# Patient Record
Sex: Female | Born: 1995 | Race: Black or African American | Hispanic: No | Marital: Single
Health system: Northeastern US, Academic
[De-identification: ages and names within clinical notes are randomized; demographics above are authoritative.]

## PROBLEM LIST (undated history)

## (undated) DIAGNOSIS — C801 Malignant (primary) neoplasm, unspecified: Secondary | ICD-10-CM

## (undated) DIAGNOSIS — G4730 Sleep apnea, unspecified: Secondary | ICD-10-CM

## (undated) DIAGNOSIS — D649 Anemia, unspecified: Secondary | ICD-10-CM

## (undated) DIAGNOSIS — R7303 Prediabetes: Secondary | ICD-10-CM

## (undated) DIAGNOSIS — S83519A Sprain of anterior cruciate ligament of unspecified knee, initial encounter: Secondary | ICD-10-CM

## (undated) HISTORY — PX: WISDOM TOOTH EXTRACTION: SHX21

## (undated) HISTORY — PX: TUMOR REMOVAL: SHX12

---

## 2017-01-04 ENCOUNTER — Ambulatory Visit (HOSPITAL_COMMUNITY)
Admission: EM | Admit: 2017-01-04 | Discharge: 2017-01-04 | Disposition: A | Discharge status: Home / Self Care | Payer: Managed Care, Other (non HMO) | Attending: Family Medicine | Admitting: Family Medicine

## 2017-01-04 ENCOUNTER — Encounter (HOSPITAL_COMMUNITY): Payer: Self-pay | Admitting: *Deleted

## 2017-01-04 DIAGNOSIS — S86911A Strain of unspecified muscle(s) and tendon(s) at lower leg level, right leg, initial encounter: Secondary | ICD-10-CM

## 2017-01-04 MED ORDER — MELOXICAM 7.5 MG PO TABS: 7.5000 mg | ORAL_TABLET | Freq: Two times a day (BID) | ORAL | 1 refills | Status: DC

## 2017-01-04 NOTE — ED Provider Notes (Signed)
[place]    CSN: [id number] Arrival date & time: 01/04/17  1028     History   Chief Complaint Chief Complaint  Patient presents with  . Knee Pain    HPI Elaine Carr is a 21 y.o. female.   The history is provided by the patient.  Knee Pain  Location:  Knee Time since incident:  1 month Injury: yes   Mechanism of injury: fall   Fall:    Fall occurred:  Walking   Entrapped after fall: no   Knee location:  R knee Pain details:    Severity:  Moderate   Progression:  Unchanged Chronicity:  New (pt states seen in ER with neg x-rays and given cane but problems continue, no ortho follow-up.) Dislocation: no   Prior injury to area:  No Associated symptoms: decreased ROM, stiffness and swelling     History reviewed. No pertinent past medical history.  There are no active problems to display for this patient.   Past Surgical History:  Procedure Laterality Date  . TUMOR REMOVAL     stomach    OB History    No data available       Home Medications    Prior to Admission medications   Medication Sig Start Date End Date Taking? Authorizing Provider  calcium-vitamin D (OSCAL WITH D) 250-125 MG-UNIT tablet Take 1 tablet by mouth daily.   Yes Historical Provider, MD  metFORMIN (GLUCOPHAGE) 1000 MG tablet Take 1,000 mg by mouth 2 (two) times daily with a meal.   Yes Historical Provider, MD  meloxicam (MOBIC) 7.5 MG tablet Take 1 tablet (7.5 mg total) by mouth 2 (two) times daily after a meal. 01/04/17   Billy Fischer, MD    Family History History reviewed. No pertinent family history.  Social History Social History  Substance Use Topics  . Smoking status: Never Smoker  . Smokeless tobacco: Never Used  . Alcohol use No     Allergies   Patient has no known allergies.   Review of Systems Review of Systems  Musculoskeletal: Positive for stiffness.     Physical Exam Triage Vital Signs ED Triage Vitals  Enc Vitals Group     BP --    Pulse Rate 01/04/17 1118 108     Resp 01/04/17 1118 19     Temp 01/04/17 1118 98 F (36.7 C)     Temp Source 01/04/17 1118 Oral     SpO2 01/04/17 1118 100 %     Weight --      Height --      Head Circumference --      Peak Flow --      Pain Score 01/04/17 1121 8     Pain Loc --      Pain Edu? --      Excl. in [place]? --    No data found.   Updated Vital Signs Pulse 108   Temp 98 F (36.7 C) (Oral)   Resp 19   SpO2 100%   Visual Acuity Right Eye Distance:   Left Eye Distance:   Bilateral Distance:    Right Eye Near:   Left Eye Near:    Bilateral Near:     Physical Exam  Constitutional: She is oriented to person, place, and time. She appears well-developed and well-nourished.  Musculoskeletal: She exhibits tenderness. She exhibits no edema or deformity.       Right knee: She exhibits decreased range of motion, swelling, effusion  and MCL laxity. She exhibits no deformity, normal alignment, no LCL laxity, normal patellar mobility and no bony tenderness. Tenderness found. Patellar tendon tenderness noted.  Neurological: She is alert and oriented to person, place, and time.  Skin: Skin is warm and dry.  Nursing note and vitals reviewed.    UC Treatments / Results  Labs (all labs ordered are listed, but only abnormal results are displayed) Labs Reviewed - No data to display  EKG  EKG Interpretation None       Radiology No results found.  Procedures Procedures (including critical care time)  Medications Ordered in UC Medications - No data to display   Initial Impression / Assessment and Plan / UC Course  I have reviewed the triage vital signs and the nursing notes.  Pertinent labs & imaging results that were available during my care of the patient were reviewed by me and considered in my medical decision making (see chart for details).       Final Clinical Impressions(s) / UC Diagnoses   Final diagnoses:  Strain of right knee, initial encounter     New Prescriptions Discharge Medication List as of 01/04/2017 11:57 AM    START taking these medications   Details  meloxicam (MOBIC) 7.5 MG tablet Take 1 tablet (7.5 mg total) by mouth 2 (two) times daily after a meal., Starting Tue 01/04/2017, Normal         Billy Fischer, MD 01/13/17 1408

## 2017-01-04 NOTE — ED Triage Notes (Signed)
Patient reports that she fell on December 22, states that her body went one way and her knee the other. States she has had continued pain since the fall, reports swelling and numbness/tingling, if in dependent position. Patient is unable to straighten knee. Has been using cane to help her with ambulation.

## 2017-01-07 ENCOUNTER — Encounter: Payer: Self-pay | Admitting: Emergency Medicine

## 2017-01-07 ENCOUNTER — Emergency Department (HOSPITAL_COMMUNITY)
Admission: EM | Admit: 2017-01-07 | Discharge: 2017-01-07 | Disposition: A | Discharge status: Home / Self Care | Payer: Managed Care, Other (non HMO) | Attending: Physician Assistant | Admitting: Physician Assistant

## 2017-01-07 DIAGNOSIS — G8929 Other chronic pain: Secondary | ICD-10-CM | POA: Insufficient documentation

## 2017-01-07 DIAGNOSIS — Z7984 Long term (current) use of oral hypoglycemic drugs: Secondary | ICD-10-CM | POA: Diagnosis not present

## 2017-01-07 DIAGNOSIS — M25561 Pain in right knee: Secondary | ICD-10-CM | POA: Diagnosis present

## 2017-01-07 DIAGNOSIS — Z79899 Other long term (current) drug therapy: Secondary | ICD-10-CM | POA: Diagnosis not present

## 2017-01-07 NOTE — Discharge Instructions (Signed)
Take your medication as directed. Follow up with the orthopedic doctor.

## 2017-01-07 NOTE — Progress Notes (Signed)
Orthopedic Tech Progress Note Patient Details:  Elaine Carr [birth year] [id number]  Ortho Devices Type of Ortho Device: Ace wrap, Crutches Ortho Device/Splint Location: RLE Ortho Device/Splint Interventions: Ordered, Adjustment, Application   Braulio Bosch 01/07/2017, 6:05 PM

## 2017-01-07 NOTE — ED Triage Notes (Signed)
Pt in from home c/o R knee pain post fall Dec 2017, [place], pt ambulatory with cane, A&O x4

## 2017-01-07 NOTE — ED Provider Notes (Signed)
[place] DEPT Provider Note    By signing my name below, I, Bea Graff, attest that this documentation has been prepared under the direction and in the presence of [facility], [place]. Electronically Signed: Bea Graff, ED Scribe. 01/07/17. 5:45 PM.    History   Chief Complaint Chief Complaint  Patient presents with  . Knee Pain    The history is provided by the patient and medical records. No language interpreter was used.    Elaine Carr is an obese 21 y.o. female who presents to the Emergency Department complaining of right knee pain that began approximately one month ago. She reports associated swelling of the right knee and numbness in the toes of her right foot. She states she fell about one month ago. She was evaluated for the injury at home ([place]) and had negative imaging and was given a cane to help with ambulation. She was referred to an orthopedist but did not go because she came back to [place] for school. Pt was seen also seen at [facility] three days ago and was given an orthopedic referral in which she called but did not take the appt because it was next month and states she cannot wait that long. She was prescribed Meloxicam from the [facility] which she states she had filled yesterday. Bearing weight and extending the right knee increases her pain. Pt denies alleviating factors. She denies weakness or tingling of the RLE, bruising, wounds.   No past medical history on file.  There are no active problems to display for this patient.   Past Surgical History:  Procedure Laterality Date  . TUMOR REMOVAL     stomach    OB History    No data available       Home Medications    Prior to Admission medications   Medication Sig Start Date End Date Taking? Authorizing Provider  calcium-vitamin D (OSCAL WITH D) 250-125 MG-UNIT tablet Take 1 tablet by mouth daily.    Historical Provider, MD  meloxicam (MOBIC) 7.5 MG tablet Take 1 tablet (7.5 mg total) by  mouth 2 (two) times daily after a meal. 01/04/17   Billy Fischer, MD  metFORMIN (GLUCOPHAGE) 1000 MG tablet Take 1,000 mg by mouth 2 (two) times daily with a meal.    Historical Provider, MD    Family History No family history on file.  Social History Social History  Substance Use Topics  . Smoking status: Never Smoker  . Smokeless tobacco: Never Used  . Alcohol use No     Allergies   Patient has no known allergies.   Review of Systems Review of Systems  Constitutional: Negative for chills, diaphoresis, fatigue and fever.  HENT: Negative for congestion, dental problem, ear pain, facial swelling, sinus pressure and sore throat.   Eyes: Negative for photophobia, pain and discharge.  Respiratory: Negative for cough, chest tightness and wheezing.   Gastrointestinal: Negative for abdominal distention, abdominal pain, constipation, diarrhea, nausea and vomiting.  Genitourinary: Negative for difficulty urinating, dysuria, flank pain and frequency.  Musculoskeletal: Positive for arthralgias and joint swelling. Negative for back pain, gait problem, myalgias, neck pain and neck stiffness.  Skin: Negative for color change, rash and wound.  Neurological: Positive for numbness. Negative for dizziness, speech difficulty, weakness, light-headedness and headaches.  Psychiatric/Behavioral: Negative for agitation and confusion.     Physical Exam Updated Vital Signs BP 144/68 (BP Location: Right Arm)   Pulse 85   Temp 98.4 F (36.9 C) (Oral)   Resp  16   LMP 01/07/2017   SpO2 99%   Physical Exam  Constitutional: She is oriented to person, place, and time. She appears well-developed and well-nourished.  Morbidly obese female  Eyes: EOM are normal.  Neck: Neck supple.  Cardiovascular:  DP pulses 2+ bilaterally.  Pulmonary/Chest: Effort normal.  Abdominal: Soft. There is no tenderness.  Musculoskeletal: Normal range of motion. She exhibits tenderness. She exhibits no deformity.    Tenderness to palpation along anterior aspect of patella of right knee. Pain increased with extension of knee. No pain flexion or internal/external rotation of knee.  Neurological: She is alert and oriented to person, place, and time. No cranial nerve deficit.  Skin: Skin is warm and dry.  Nursing note and vitals reviewed.    ED Treatments / Results  DIAGNOSTIC STUDIES: Oxygen Saturation is 99% on RA, normal by my interpretation.   COORDINATION OF CARE: 5:01 PM- Will have pt undress to better examine knee. Pt verbalizes understanding and agrees to plan.  5:41 PM- Pt refused imaging and refused to undress for knee examination. Will provide crutches and an ace wrap and encouraged pt to make the appt with orthopedics.  Medications - No data to display  Labs (all labs ordered are listed, but only abnormal results are displayed) Labs Reviewed - No data to display  Radiology No results found.  Procedures Procedures (including critical care time)  Medications Ordered in ED Medications - No data to display   Initial Impression / Assessment and Plan / ED Course  I have reviewed the triage vital signs and the nursing notes.    Patient refused X-Ray for right knee pain that has been ongoing for the past month. Pt advised to follow up with orthopedics as previously directed. Patient given ace wrap and crutches while in ED, conservative therapy recommended and discussed. Patient will be discharged home & is agreeable with above plan. Returns precautions discussed. Pt appears safe for discharge.  I personally performed the services described in this documentation, which was scribed in my presence. The recorded information has been reviewed and is accurate.   Final Clinical Impressions(s) / ED Diagnoses   Final diagnoses:  Chronic pain of right knee    New Prescriptions New Prescriptions   No medications on file     [facility], NP 01/07/17 [place],  MD 01/07/17 2312

## 2017-01-18 ENCOUNTER — Ambulatory Visit (INDEPENDENT_AMBULATORY_CARE_PROVIDER_SITE_OTHER): Payer: Managed Care, Other (non HMO) | Admitting: Orthopedic Surgery

## 2017-01-18 ENCOUNTER — Encounter (INDEPENDENT_AMBULATORY_CARE_PROVIDER_SITE_OTHER): Payer: Self-pay | Admitting: Orthopedic Surgery

## 2017-01-18 DIAGNOSIS — M25561 Pain in right knee: Secondary | ICD-10-CM

## 2017-01-18 NOTE — Progress Notes (Signed)
   Office Visit Note   Patient: Elaine Carr           Date of Birth: 23-May-1996           MRN: [id number] Visit Date: 01/18/2017 Requested by: No referring provider defined for this encounter. PCP: PROVIDER NOT IN SYSTEM  Subjective: Chief Complaint  Patient presents with  . Right Knee - Pain    HPI component is a 21 year old female with right knee pain.  She injured her knee 6 weeks ago.  She felt a "creak" in the knee and now she's had some pain and some feelings of instability.  She states that it swells at times.  She states it difficult for straight and out all the way.  She's been using crutches and a cane.  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  Radiographs in [place] were negative by report.  She is having a lot of disability from this injury.              Review of Systems All systems reviewed are negative as they relate to the chief complaint within the history of present illness.  Patient denies  fevers or chills.    Assessment & Plan: Visit Diagnoses:  1. Acute pain of right knee     Plan: Impression is right knee effusion with possible internal derangement consisting of either meniscal pathology and/or ligament pathology.  Her collaterals feel stable but that's about the best exam I can get today based on her body habitus and guarding.  Her extensor mechanism is intact.  Pedal pulses are palpable.  Plan MRI scan and evaluate for meniscal and ligament damage all see her back after that study  Follow-Up Instructions: Return for after MRI.   Orders:  No orders of the defined types were placed in this encounter.  No orders of the defined types were placed in this encounter.     Procedures: No procedures performed   Clinical Data: No additional findings.  Objective: Vital Signs: LMP 01/07/2017   Physical Exam   Constitutional: Patient appears well-developed HEENT:  Head: Normocephalic Eyes:EOM are normal Neck: Normal range of motion Cardiovascular:  Normal rate Pulmonary/chest: Effort normal Neurologic: Patient is alert Skin: Skin is warm Psychiatric: Patient has normal mood and affect    Ortho Exam examination the right knee demonstrates an effusion intact extensor mechanism no groin pain interlocks rotation leg intact ankle dorsiflexion plantar flexion strength palpable pedal pulses full range of motion she does have less hyperextension on the right compared to the left I cannot get a great cruciate ligament exam.  Does not appear to be posterior lateral rotatory instability.  Increased body mass index antalgic gait to the right  Specialty Comments:  No specialty comments available.  Imaging: No results found.   PMFS History: There are no active problems to display for this patient.  No past medical history on file.  No family history on file.  Past Surgical History:  Procedure Laterality Date  . TUMOR REMOVAL     stomach   Social History   Occupational History  . Not on file.   Social History Main Topics  . Smoking status: Never Smoker  . Smokeless tobacco: Never Used  . Alcohol use No  . Drug use: Unknown  . Sexual activity: Not on file

## 2017-02-01 ENCOUNTER — Ambulatory Visit
Admission: RE | Admit: 2017-02-01 | Discharge: 2017-02-01 | Disposition: A | Discharge status: Home / Self Care | Payer: Managed Care, Other (non HMO) | Source: Ambulatory Visit | Attending: Orthopedic Surgery | Admitting: Orthopedic Surgery

## 2017-02-01 DIAGNOSIS — M25561 Pain in right knee: Secondary | ICD-10-CM

## 2017-03-03 ENCOUNTER — Other Ambulatory Visit (INDEPENDENT_AMBULATORY_CARE_PROVIDER_SITE_OTHER): Payer: Self-pay | Admitting: Orthopedic Surgery

## 2017-03-03 ENCOUNTER — Ambulatory Visit (INDEPENDENT_AMBULATORY_CARE_PROVIDER_SITE_OTHER): Payer: Managed Care, Other (non HMO) | Admitting: Orthopedic Surgery

## 2017-03-03 ENCOUNTER — Encounter (INDEPENDENT_AMBULATORY_CARE_PROVIDER_SITE_OTHER): Payer: Self-pay | Admitting: Orthopedic Surgery

## 2017-03-03 DIAGNOSIS — S83511A Sprain of anterior cruciate ligament of right knee, initial encounter: Secondary | ICD-10-CM

## 2017-03-03 DIAGNOSIS — S83512A Sprain of anterior cruciate ligament of left knee, initial encounter: Secondary | ICD-10-CM

## 2017-03-03 NOTE — Progress Notes (Signed)
Office Visit Note   Patient: Elaine Carr           Date of Birth: 04-Jan-1996           MRN: [id number] Visit Date: 03/03/2017 Requested by: No referring provider defined for this encounter. PCP: PROVIDER NOT IN SYSTEM  Subjective: Chief Complaint  Patient presents with  . Right Knee - Follow-up    [id number] is a 21 year old female with right knee pain.  Had an injury several months ago.  That occurred in December.  She is ambulating with a cane.  No family history of personal history of deep vein thrombosis or pulmonary embolism.  She has some friends in school but not much family around.  She reports continued knee pain and instability.  MRI scan does show complete anterior cruciate ligament tear as well as a bucket-handle tear of the medial meniscus which is essentially the entire meniscus flipped into the anterior joint.              ROS: All systems reviewed are negative as they relate to the chief complaint within the history of present illness.  Patient denies  fevers or chills.   Assessment & Plan: Visit Diagnoses:  1. Rupture of anterior cruciate ligament of right knee, initial encounter     Plan: Impression is right knee anterior cruciate ligament tear with bucket-handle tear of the medial meniscus flipped into the anterior joint.  This is her entire medial meniscus involved.  Patient is morbidly obese.  If the meniscus cannot be repaired she would likely be looking at knee replacement before she started.  Plan at this time his surgical stabilization of the knee with anterior cruciate ligament reconstruction using hamstring autograft.  We will also attempt to repair that medial meniscus.  There was some talk on the report about lateral meniscus instability.  We will examine that at the time of surgery as well.  The risk and benefits are discussed with patient including but not limited to infection nerve vessel damage knee stiffness potential for deep vein thrombosis extended  recovery is also discussed.  All of these things will be made more complicated by her morbid obesity.  Patient understands the risks and benefits and wishes to proceed with surgical intervention.  All questions answered  Follow-Up Instructions: No Follow-up on file.   Orders:  No orders of the defined types were placed in this encounter.  No orders of the defined types were placed in this encounter.     Procedures: No procedures performed   Clinical Data: No additional findings.  Objective: Vital Signs: There were no vitals taken for this visit.  Physical Exam:   Constitutional: Patient appears well-developed HEENT:  Head: Normocephalic Eyes:EOM are normal Neck: Normal range of motion Cardiovascular: Normal rate Pulmonary/chest: Effort normal Neurologic: Patient is alert Skin: Skin is warm Psychiatric: Patient has normal mood and affect    Ortho Exam: Orthopedic exam demonstrates a very large right leg and left leg.  Trace effusion is present.  There is some clicking with range of motion as well as pain on the medial side.  Collaterals are stable.  Anterior cruciate ligament has laxity PCL feels intact.  Although is difficult to assess it does not appear to be any posterior and rotatory instability.  Patient does have general slight valgus alignment.  Specialty Comments:  No specialty comments available.  Imaging: No results found.   PMFS History: There are no active problems to display for this patient.  No past medical history on file.  No family history on file.  Past Surgical History:  Procedure Laterality Date  . TUMOR REMOVAL     stomach   Social History   Occupational History  . Not on file.   Social History Main Topics  . Smoking status: Never Smoker  . Smokeless tobacco: Never Used  . Alcohol use No  . Drug use: Unknown  . Sexual activity: Not on file

## 2017-03-07 ENCOUNTER — Encounter (HOSPITAL_COMMUNITY): Payer: Self-pay | Admitting: *Deleted

## 2017-03-07 ENCOUNTER — Telehealth (INDEPENDENT_AMBULATORY_CARE_PROVIDER_SITE_OTHER): Payer: Self-pay | Admitting: Orthopedic Surgery

## 2017-03-07 NOTE — Progress Notes (Signed)
Pt denies SOB, chest pain, and being under the care of a cardiologist. Pt denies having a stress test, echo and cardiac cath. Pt denies having an EKG and chest x ray within the last year. Pt denies recent labs. Pt stated that she has been taking Metformin since 7th grade and does not have a glucometer to check BG. Pt made aware to stop taking Aspirin, vitamins, fish oil and herbal medications. Do not take any NSAIDs ie: Ibuprofen, Advil, Naproxen, BC and Goody Powder or any medication containing Aspirin. Pt verbalized understanding of all pre-op instructions

## 2017-03-07 NOTE — Telephone Encounter (Signed)
Hospital called needing orders signed for this pts surgery tomorrow. Thank you!!

## 2017-03-08 ENCOUNTER — Ambulatory Visit (HOSPITAL_COMMUNITY): Payer: Managed Care, Other (non HMO) | Admitting: Certified Registered Nurse Anesthetist

## 2017-03-08 ENCOUNTER — Observation Stay (HOSPITAL_COMMUNITY)
Admission: RE | Admit: 2017-03-08 | Discharge: 2017-03-12 | Disposition: A | Discharge status: Home / Self Care | Payer: Managed Care, Other (non HMO) | Source: Ambulatory Visit | Attending: Orthopedic Surgery | Admitting: Orthopedic Surgery

## 2017-03-08 ENCOUNTER — Encounter (HOSPITAL_COMMUNITY): Payer: Self-pay | Admitting: *Deleted

## 2017-03-08 ENCOUNTER — Encounter (HOSPITAL_COMMUNITY)
Admission: RE | Disposition: A | Discharge status: Home / Self Care | Payer: Self-pay | Source: Ambulatory Visit | Attending: Orthopedic Surgery

## 2017-03-08 DIAGNOSIS — D649 Anemia, unspecified: Secondary | ICD-10-CM | POA: Diagnosis not present

## 2017-03-08 DIAGNOSIS — Z791 Long term (current) use of non-steroidal anti-inflammatories (NSAID): Secondary | ICD-10-CM | POA: Diagnosis not present

## 2017-03-08 DIAGNOSIS — R319 Hematuria, unspecified: Secondary | ICD-10-CM | POA: Insufficient documentation

## 2017-03-08 DIAGNOSIS — G4730 Sleep apnea, unspecified: Secondary | ICD-10-CM | POA: Insufficient documentation

## 2017-03-08 DIAGNOSIS — S83211A Bucket-handle tear of medial meniscus, current injury, right knee, initial encounter: Secondary | ICD-10-CM | POA: Diagnosis not present

## 2017-03-08 DIAGNOSIS — Z9989 Dependence on other enabling machines and devices: Secondary | ICD-10-CM | POA: Diagnosis not present

## 2017-03-08 DIAGNOSIS — Z79899 Other long term (current) drug therapy: Secondary | ICD-10-CM | POA: Diagnosis not present

## 2017-03-08 DIAGNOSIS — S83511A Sprain of anterior cruciate ligament of right knee, initial encounter: Principal | ICD-10-CM | POA: Insufficient documentation

## 2017-03-08 DIAGNOSIS — R7303 Prediabetes: Secondary | ICD-10-CM | POA: Insufficient documentation

## 2017-03-08 DIAGNOSIS — S83511D Sprain of anterior cruciate ligament of right knee, subsequent encounter: Secondary | ICD-10-CM | POA: Diagnosis not present

## 2017-03-08 DIAGNOSIS — X58XXXA Exposure to other specified factors, initial encounter: Secondary | ICD-10-CM | POA: Insufficient documentation

## 2017-03-08 DIAGNOSIS — S83512A Sprain of anterior cruciate ligament of left knee, initial encounter: Secondary | ICD-10-CM

## 2017-03-08 DIAGNOSIS — Z7984 Long term (current) use of oral hypoglycemic drugs: Secondary | ICD-10-CM | POA: Diagnosis not present

## 2017-03-08 DIAGNOSIS — S83519A Sprain of anterior cruciate ligament of unspecified knee, initial encounter: Secondary | ICD-10-CM | POA: Diagnosis present

## 2017-03-08 DIAGNOSIS — M23331 Other meniscus derangements, other medial meniscus, right knee: Secondary | ICD-10-CM | POA: Diagnosis not present

## 2017-03-08 DIAGNOSIS — M25561 Pain in right knee: Secondary | ICD-10-CM | POA: Diagnosis present

## 2017-03-08 HISTORY — PX: ANTERIOR CRUCIATE LIGAMENT REPAIR: SHX115

## 2017-03-08 HISTORY — DX: Anemia, unspecified: D64.9

## 2017-03-08 HISTORY — DX: Sprain of anterior cruciate ligament of unspecified knee, initial encounter: S83.519A

## 2017-03-08 HISTORY — DX: Sleep apnea, unspecified: G47.30

## 2017-03-08 HISTORY — DX: Malignant (primary) neoplasm, unspecified: C80.1

## 2017-03-08 HISTORY — PX: KNEE ARTHROSCOPY WITH MENISCAL REPAIR: SHX5653

## 2017-03-08 HISTORY — DX: Prediabetes: R73.03

## 2017-03-08 LAB — CBC
HEMATOCRIT: 38.3 % (ref 36.0–46.0)
Hemoglobin: 12.2 g/dL (ref 12.0–15.0)
MCH: 27.7 pg (ref 26.0–34.0)
MCHC: 31.9 g/dL (ref 30.0–36.0)
MCV: 87 fL (ref 78.0–100.0)
Platelets: 451 10*3/uL — ABNORMAL HIGH (ref 150–400)
RBC: 4.4 MIL/uL (ref 3.87–5.11)
RDW: 16.8 % — ABNORMAL HIGH (ref 11.5–15.5)
WBC: 11.6 10*3/uL — ABNORMAL HIGH (ref 4.0–10.5)

## 2017-03-08 LAB — BASIC METABOLIC PANEL
Anion gap: 9 (ref 5–15)
BUN: 9 mg/dL (ref 6–20)
CO2: 25 mmol/L (ref 22–32)
Calcium: 9.4 mg/dL (ref 8.9–10.3)
Chloride: 104 mmol/L (ref 101–111)
Creatinine, Ser: 0.69 mg/dL (ref 0.44–1.00)
Glucose, Bld: 94 mg/dL (ref 65–99)
Potassium: 4.2 mmol/L (ref 3.5–5.1)
SODIUM: 138 mmol/L (ref 135–145)

## 2017-03-08 LAB — HCG, SERUM, QUALITATIVE: PREG SERUM: NEGATIVE

## 2017-03-08 LAB — GLUCOSE, CAPILLARY: GLUCOSE-CAPILLARY: 92 mg/dL (ref 65–99)

## 2017-03-08 SURGERY — RECONSTRUCTION, KNEE, ACL, USING HAMSTRING GRAFT
Anesthesia: General | Site: Knee | Laterality: Right

## 2017-03-08 MED ORDER — ACETAMINOPHEN 325 MG PO TABS
650.0000 mg | ORAL_TABLET | Freq: Four times a day (QID) | ORAL | Status: DC | PRN
  Administered 2017-03-10 – 2017-03-11 (×2): 650 mg via ORAL
  Filled 2017-03-08 (×2): qty 2

## 2017-03-08 MED ORDER — PROPOFOL 10 MG/ML IV BOLUS
INTRAVENOUS | Status: AC
  Filled 2017-03-08: qty 20

## 2017-03-08 MED ORDER — LIDOCAINE 2% (20 MG/ML) 5 ML SYRINGE
INTRAMUSCULAR | Status: DC | PRN
  Administered 2017-03-08: 60 mg via INTRAVENOUS

## 2017-03-08 MED ORDER — METFORMIN HCL ER 750 MG PO TB24
750.0000 mg | ORAL_TABLET | Freq: Two times a day (BID) | ORAL | Status: DC
  Administered 2017-03-09 – 2017-03-12 (×7): 750 mg via ORAL
  Filled 2017-03-08: qty 1.5
  Filled 2017-03-08 (×5): qty 1
  Filled 2017-03-08 (×2): qty 1.5
  Filled 2017-03-08 (×2): qty 1

## 2017-03-08 MED ORDER — MIDAZOLAM HCL 2 MG/2ML IJ SOLN
INTRAMUSCULAR | Status: AC
  Filled 2017-03-08: qty 2

## 2017-03-08 MED ORDER — FERROUS SULFATE 325 (65 FE) MG PO TABS
325.0000 mg | ORAL_TABLET | Freq: Two times a day (BID) | ORAL | Status: DC
  Administered 2017-03-08 – 2017-03-12 (×8): 325 mg via ORAL
  Filled 2017-03-08 (×8): qty 1

## 2017-03-08 MED ORDER — MORPHINE SULFATE (PF) 4 MG/ML IV SOLN
4.0000 mg | INTRAVENOUS | Status: DC | PRN
  Administered 2017-03-09 – 2017-03-11 (×8): 4 mg via INTRAVENOUS
  Filled 2017-03-08 (×8): qty 1

## 2017-03-08 MED ORDER — IBUPROFEN 200 MG PO TABS
400.0000 mg | ORAL_TABLET | Freq: Every day | ORAL | Status: DC | PRN
  Administered 2017-03-09 – 2017-03-12 (×2): 400 mg via ORAL
  Filled 2017-03-08 (×3): qty 2

## 2017-03-08 MED ORDER — FENTANYL CITRATE (PF) 100 MCG/2ML IJ SOLN
INTRAMUSCULAR | Status: AC
  Filled 2017-03-08: qty 2

## 2017-03-08 MED ORDER — SUCCINYLCHOLINE CHLORIDE 20 MG/ML IJ SOLN
INTRAMUSCULAR | Status: DC | PRN
  Administered 2017-03-08: 100 mg via INTRAVENOUS

## 2017-03-08 MED ORDER — 0.9 % SODIUM CHLORIDE (POUR BTL) OPTIME
TOPICAL | Status: DC | PRN
  Administered 2017-03-08: 1000 mL

## 2017-03-08 MED ORDER — FENTANYL CITRATE (PF) 100 MCG/2ML IJ SOLN: 25.0000 ug | INTRAMUSCULAR | Status: DC | PRN

## 2017-03-08 MED ORDER — METHOCARBAMOL 1000 MG/10ML IJ SOLN
500.0000 mg | Freq: Four times a day (QID) | INTRAVENOUS | Status: DC | PRN
  Filled 2017-03-08: qty 5

## 2017-03-08 MED ORDER — METOCLOPRAMIDE HCL 5 MG/ML IJ SOLN: 5.0000 mg | Freq: Three times a day (TID) | INTRAMUSCULAR | Status: DC | PRN

## 2017-03-08 MED ORDER — CEFAZOLIN SODIUM-DEXTROSE 2-4 GM/100ML-% IV SOLN
2.0000 g | Freq: Four times a day (QID) | INTRAVENOUS | Status: AC
  Administered 2017-03-08 – 2017-03-09 (×2): 2 g via INTRAVENOUS
  Filled 2017-03-08 (×3): qty 100

## 2017-03-08 MED ORDER — CEFAZOLIN SODIUM 1 G IJ SOLR
INTRAMUSCULAR | Status: DC | PRN
  Administered 2017-03-08: 1 g via INTRAMUSCULAR

## 2017-03-08 MED ORDER — DEXMEDETOMIDINE HCL IN NACL 200 MCG/50ML IV SOLN
INTRAVENOUS | Status: AC
  Filled 2017-03-08: qty 50

## 2017-03-08 MED ORDER — CHLORHEXIDINE GLUCONATE 4 % EX LIQD
60.0000 mL | Freq: Once | CUTANEOUS | Status: DC
Start: 2017-03-08 — End: 2017-03-08

## 2017-03-08 MED ORDER — PROPOFOL 10 MG/ML IV BOLUS
INTRAVENOUS | Status: DC | PRN
  Administered 2017-03-08: 30 mg via INTRAVENOUS
  Administered 2017-03-08: 200 mg via INTRAVENOUS
  Administered 2017-03-08: 20 mg via INTRAVENOUS

## 2017-03-08 MED ORDER — RIVAROXABAN 10 MG PO TABS
10.0000 mg | ORAL_TABLET | Freq: Every day | ORAL | Status: DC
  Administered 2017-03-09 – 2017-03-12 (×4): 10 mg via ORAL
  Filled 2017-03-08 (×4): qty 1

## 2017-03-08 MED ORDER — SUGAMMADEX SODIUM 200 MG/2ML IV SOLN
INTRAVENOUS | Status: DC | PRN
  Administered 2017-03-08: 300 mg via INTRAVENOUS

## 2017-03-08 MED ORDER — SUCCINYLCHOLINE CHLORIDE 200 MG/10ML IV SOSY
PREFILLED_SYRINGE | INTRAVENOUS | Status: AC
  Filled 2017-03-08: qty 10

## 2017-03-08 MED ORDER — CEFAZOLIN SODIUM-DEXTROSE 2-4 GM/100ML-% IV SOLN
INTRAVENOUS | Status: AC
  Filled 2017-03-08: qty 100

## 2017-03-08 MED ORDER — ROCURONIUM BROMIDE 10 MG/ML (PF) SYRINGE
PREFILLED_SYRINGE | INTRAVENOUS | Status: DC | PRN
  Administered 2017-03-08: 10 mg via INTRAVENOUS
  Administered 2017-03-08 (×3): 20 mg via INTRAVENOUS
  Administered 2017-03-08: 50 mg via INTRAVENOUS
  Administered 2017-03-08: 10 mg via INTRAVENOUS
  Administered 2017-03-08 (×2): 20 mg via INTRAVENOUS

## 2017-03-08 MED ORDER — OXYCODONE HCL 5 MG PO TABS
5.0000 mg | ORAL_TABLET | ORAL | Status: DC | PRN
  Administered 2017-03-08 – 2017-03-12 (×18): 10 mg via ORAL
  Filled 2017-03-08 (×19): qty 2

## 2017-03-08 MED ORDER — EPINEPHRINE PF 1 MG/ML IJ SOLN
INTRAMUSCULAR | Status: AC
  Filled 2017-03-08: qty 1

## 2017-03-08 MED ORDER — POTASSIUM CHLORIDE IN NACL 20-0.9 MEQ/L-% IV SOLN
INTRAVENOUS | Status: AC
  Administered 2017-03-08: 23:00:00 via INTRAVENOUS
  Filled 2017-03-08: qty 1000

## 2017-03-08 MED ORDER — BUPIVACAINE-EPINEPHRINE 0.5% -1:200000 IJ SOLN
INTRAMUSCULAR | Status: DC | PRN
  Administered 2017-03-08: 20 mL

## 2017-03-08 MED ORDER — ONDANSETRON HCL 4 MG PO TABS: 4.0000 mg | ORAL_TABLET | Freq: Four times a day (QID) | ORAL | Status: DC | PRN

## 2017-03-08 MED ORDER — MORPHINE SULFATE (PF) 4 MG/ML IV SOLN
INTRAVENOUS | Status: DC | PRN
  Administered 2017-03-08: 8 mg via INTRAMUSCULAR

## 2017-03-08 MED ORDER — MORPHINE SULFATE (PF) 4 MG/ML IV SOLN
INTRAVENOUS | Status: AC
  Filled 2017-03-08: qty 2

## 2017-03-08 MED ORDER — ACETAMINOPHEN 650 MG RE SUPP
650.0000 mg | Freq: Four times a day (QID) | RECTAL | Status: DC | PRN
Start: 2017-03-08 — End: 2017-03-13

## 2017-03-08 MED ORDER — LACTATED RINGERS IV SOLN
INTRAVENOUS | Status: DC
  Administered 2017-03-08 (×2): via INTRAVENOUS

## 2017-03-08 MED ORDER — METHOCARBAMOL 500 MG PO TABS
500.0000 mg | ORAL_TABLET | Freq: Four times a day (QID) | ORAL | Status: DC | PRN
  Administered 2017-03-08 – 2017-03-11 (×7): 500 mg via ORAL
  Filled 2017-03-08 (×8): qty 1

## 2017-03-08 MED ORDER — PHENYLEPHRINE 40 MCG/ML (10ML) SYRINGE FOR IV PUSH (FOR BLOOD PRESSURE SUPPORT)
PREFILLED_SYRINGE | INTRAVENOUS | Status: AC
  Filled 2017-03-08: qty 10

## 2017-03-08 MED ORDER — PHENYLEPHRINE 40 MCG/ML (10ML) SYRINGE FOR IV PUSH (FOR BLOOD PRESSURE SUPPORT)
PREFILLED_SYRINGE | INTRAVENOUS | Status: DC | PRN
  Administered 2017-03-08: 80 ug via INTRAVENOUS
  Administered 2017-03-08 (×2): 40 ug via INTRAVENOUS
  Administered 2017-03-08 (×2): 80 ug via INTRAVENOUS

## 2017-03-08 MED ORDER — CEFAZOLIN SODIUM-DEXTROSE 2-4 GM/100ML-% IV SOLN
2.0000 g | INTRAVENOUS | Status: AC
  Administered 2017-03-08: 2 g via INTRAVENOUS
  Administered 2017-03-08: 3 g via INTRAVENOUS

## 2017-03-08 MED ORDER — ROCURONIUM BROMIDE 50 MG/5ML IV SOSY
PREFILLED_SYRINGE | INTRAVENOUS | Status: AC
  Filled 2017-03-08: qty 10

## 2017-03-08 MED ORDER — CEFAZOLIN SODIUM 1 G IJ SOLR
INTRAMUSCULAR | Status: AC
  Filled 2017-03-08: qty 10

## 2017-03-08 MED ORDER — EPINEPHRINE PF 1 MG/ML IJ SOLN
INTRAMUSCULAR | Status: AC
  Filled 2017-03-08: qty 5

## 2017-03-08 MED ORDER — CLONIDINE HCL (ANALGESIA) 100 MCG/ML EP SOLN
150.0000 ug | Freq: Once | EPIDURAL | Status: AC
  Administered 2017-03-08: 100 ug via INTRA_ARTICULAR
  Filled 2017-03-08: qty 1.5

## 2017-03-08 MED ORDER — BUPIVACAINE HCL (PF) 0.5 % IJ SOLN
INTRAMUSCULAR | Status: AC
  Filled 2017-03-08: qty 30

## 2017-03-08 MED ORDER — DEXMEDETOMIDINE HCL 200 MCG/2ML IV SOLN
INTRAVENOUS | Status: DC | PRN
  Administered 2017-03-08 (×6): 8 ug via INTRAVENOUS

## 2017-03-08 MED ORDER — ADULT MULTIVITAMIN W/MINERALS CH
1.0000 | ORAL_TABLET | Freq: Every day | ORAL | Status: DC
Start: 2017-03-08 — End: 2017-03-13
  Administered 2017-03-08 – 2017-03-12 (×5): 1 via ORAL
  Filled 2017-03-08 (×5): qty 1

## 2017-03-08 MED ORDER — METOCLOPRAMIDE HCL 5 MG PO TABS
5.0000 mg | ORAL_TABLET | Freq: Three times a day (TID) | ORAL | Status: DC | PRN
  Administered 2017-03-12: 10 mg via ORAL
  Filled 2017-03-08: qty 2

## 2017-03-08 MED ORDER — CHLORHEXIDINE GLUCONATE 4 % EX LIQD: 60.0000 mL | Freq: Once | CUTANEOUS | Status: DC

## 2017-03-08 MED ORDER — SODIUM CHLORIDE 0.9 % IR SOLN
Status: DC | PRN
  Administered 2017-03-08: 6000 mL
  Administered 2017-03-08: 3000 mL
  Administered 2017-03-08 (×2): 6000 mL

## 2017-03-08 MED ORDER — ONDANSETRON HCL 4 MG/2ML IJ SOLN
INTRAMUSCULAR | Status: DC | PRN
  Administered 2017-03-08: 4 mg via INTRAVENOUS

## 2017-03-08 MED ORDER — VITAMIN D 1000 UNITS PO TABS
2000.0000 [IU] | ORAL_TABLET | Freq: Every day | ORAL | Status: DC
  Administered 2017-03-08 – 2017-03-12 (×5): 2000 [IU] via ORAL
  Filled 2017-03-08 (×8): qty 2

## 2017-03-08 MED ORDER — FENTANYL CITRATE (PF) 100 MCG/2ML IJ SOLN
INTRAMUSCULAR | Status: DC | PRN
  Administered 2017-03-08: 50 ug via INTRAVENOUS
  Administered 2017-03-08: 100 ug via INTRAVENOUS
  Administered 2017-03-08 (×2): 50 ug via INTRAVENOUS
  Administered 2017-03-08 (×2): 25 ug via INTRAVENOUS

## 2017-03-08 MED ORDER — SUGAMMADEX SODIUM 500 MG/5ML IV SOLN
INTRAVENOUS | Status: AC
  Filled 2017-03-08: qty 5

## 2017-03-08 MED ORDER — ONDANSETRON HCL 4 MG/2ML IJ SOLN
INTRAMUSCULAR | Status: AC
  Filled 2017-03-08: qty 4

## 2017-03-08 MED ORDER — MIDAZOLAM HCL 2 MG/2ML IJ SOLN
INTRAMUSCULAR | Status: DC | PRN
  Administered 2017-03-08 (×2): 1 mg via INTRAVENOUS

## 2017-03-08 MED ORDER — ONDANSETRON HCL 4 MG/2ML IJ SOLN
4.0000 mg | Freq: Four times a day (QID) | INTRAMUSCULAR | Status: DC | PRN
  Administered 2017-03-11 – 2017-03-12 (×3): 4 mg via INTRAVENOUS
  Filled 2017-03-08 (×3): qty 2

## 2017-03-08 SURGICAL SUPPLY — 112 items
ALCOHOL 70% 16 OZ (MISCELLANEOUS) ×3 IMPLANT
ANCHOR BUTTON TIGHTROPE ACL RT (Orthopedic Implant) ×3 IMPLANT
BANDAGE ACE 6X5 VEL STRL LF (GAUZE/BANDAGES/DRESSINGS) IMPLANT
BANDAGE ESMARK 6X9 LF (GAUZE/BANDAGES/DRESSINGS) IMPLANT
BLADE CLIPPER SURG (BLADE) IMPLANT
BLADE CUTTER GATOR 3.5 (BLADE) IMPLANT
BLADE GREAT WHITE 4.2 (BLADE) ×3 IMPLANT
BLADE SURG 10 STRL SS (BLADE) ×3 IMPLANT
BLADE SURG 15 STRL LF DISP TIS (BLADE) ×6 IMPLANT
BLADE SURG 15 STRL SS (BLADE) ×3
BNDG ELASTIC 6X15 VLCR STRL LF (GAUZE/BANDAGES/DRESSINGS) ×3 IMPLANT
BNDG ESMARK 6X9 LF (GAUZE/BANDAGES/DRESSINGS)
BUR OVAL 6.0 (BURR) ×3 IMPLANT
COVER MAYO STAND STRL (DRAPES) ×3 IMPLANT
COVER SURGICAL LIGHT HANDLE (MISCELLANEOUS) ×3 IMPLANT
CUFF TOURNIQUET SINGLE 34IN LL (TOURNIQUET CUFF) IMPLANT
CUFF TOURNIQUET SINGLE 44IN (TOURNIQUET CUFF) ×3 IMPLANT
DECANTER SPIKE VIAL GLASS SM (MISCELLANEOUS) ×3 IMPLANT
DRAPE ARTHROSCOPY W/POUCH 114 (DRAPES) ×3 IMPLANT
DRAPE INCISE IOBAN 66X45 STRL (DRAPES) ×9 IMPLANT
DRAPE ORTHO SPLIT 77X108 STRL (DRAPES) ×3
DRAPE PROXIMA HALF (DRAPES) IMPLANT
DRAPE SURG ORHT 6 SPLT 77X108 (DRAPES) ×6 IMPLANT
DRAPE U-SHAPE 47X51 STRL (DRAPES) ×3 IMPLANT
DRILL FLIPCUTTER II 7.5MM (MISCELLANEOUS) IMPLANT
DRILL FLIPCUTTER II 8.0MM (INSTRUMENTS) IMPLANT
DRILL FLIPCUTTER II 8.5MM (INSTRUMENTS) IMPLANT
DRILL FLIPCUTTER II 9.0MM (INSTRUMENTS) ×2 IMPLANT
DRSG PAD ABDOMINAL 8X10 ST (GAUZE/BANDAGES/DRESSINGS) IMPLANT
DRSG TEGADERM 4X4.75 (GAUZE/BANDAGES/DRESSINGS) ×18 IMPLANT
DURAPREP 26ML APPLICATOR (WOUND CARE) ×9 IMPLANT
ELECT REM PT RETURN 9FT ADLT (ELECTROSURGICAL) ×3
ELECTRODE REM PT RTRN 9FT ADLT (ELECTROSURGICAL) ×2 IMPLANT
FIBERLOOP 2 0 (SUTURE) ×27 IMPLANT
FLIPCUTTER II 7.5MM (MISCELLANEOUS)
FLIPCUTTER II 8.0MM (INSTRUMENTS)
FLIPCUTTER II 8.5MM (INSTRUMENTS)
FLIPCUTTER II 9.0MM (INSTRUMENTS) ×3
GAUZE SPONGE 4X4 12PLY STRL (GAUZE/BANDAGES/DRESSINGS) ×3 IMPLANT
GAUZE XEROFORM 1X8 LF (GAUZE/BANDAGES/DRESSINGS) IMPLANT
GAUZE XEROFORM 5X9 LF (GAUZE/BANDAGES/DRESSINGS) ×3 IMPLANT
GEL DBM ALLOFUSE 1CC INJECT (Bone Implant) ×6 IMPLANT
GLOVE BIOGEL PI IND STRL 7.5 (GLOVE) ×2 IMPLANT
GLOVE BIOGEL PI IND STRL 8 (GLOVE) ×2 IMPLANT
GLOVE BIOGEL PI INDICATOR 7.5 (GLOVE) ×1
GLOVE BIOGEL PI INDICATOR 8 (GLOVE) ×1
GLOVE ECLIPSE 7.0 STRL STRAW (GLOVE) ×3 IMPLANT
GLOVE ORTHO TXT STRL SZ7.5 (GLOVE) ×3 IMPLANT
GLOVE SURG ORTHO 8.0 STRL STRW (GLOVE) ×3 IMPLANT
GOWN STRL REUS W/ TWL LRG LVL3 (GOWN DISPOSABLE) ×6 IMPLANT
GOWN STRL REUS W/ TWL XL LVL3 (GOWN DISPOSABLE) ×2 IMPLANT
GOWN STRL REUS W/TWL LRG LVL3 (GOWN DISPOSABLE) ×3
GOWN STRL REUS W/TWL XL LVL3 (GOWN DISPOSABLE) ×1
IMMOBILIZER KNEE 22  40 CIR (ORTHOPEDIC SUPPLIES) ×1
IMMOBILIZER KNEE 22 40 CIR (ORTHOPEDIC SUPPLIES) ×2 IMPLANT
IMMOBILIZER KNEE 22 UNIV (SOFTGOODS) IMPLANT
KIT BASIN OR (CUSTOM PROCEDURE TRAY) ×3 IMPLANT
KIT BIOCARTILAGE DEL W/SYRINGE (KITS) IMPLANT
KIT BIOCARTILAGE LG JOINT MIX (KITS) ×3 IMPLANT
KIT ROOM TURNOVER OR (KITS) ×3 IMPLANT
MANIFOLD NEPTUNE II (INSTRUMENTS) ×3 IMPLANT
NEEDLE 18GX1X1/2 (RX/OR ONLY) (NEEDLE) ×12 IMPLANT
NEEDLE FILTER BLUNT 18X 1/2SAF (NEEDLE) ×4
NEEDLE FILTER BLUNT 18X1 1/2 (NEEDLE) ×8 IMPLANT
NEEDLE HYPO 25GX1X1/2 BEV (NEEDLE) ×3 IMPLANT
NEEDLE SUT 2-0 SCORPION KNEE (NEEDLE) IMPLANT
NS IRRIG 1000ML POUR BTL (IV SOLUTION) ×3 IMPLANT
PACK ARTHROSCOPY DSU (CUSTOM PROCEDURE TRAY) ×3 IMPLANT
PAD ARMBOARD 7.5X6 YLW CONV (MISCELLANEOUS) ×6 IMPLANT
PAD CAST 4YDX4 CTTN HI CHSV (CAST SUPPLIES) IMPLANT
PADDING CAST COTTON 4X4 STRL (CAST SUPPLIES)
PADDING CAST COTTON 6X4 STRL (CAST SUPPLIES) ×3 IMPLANT
PENCIL BUTTON HOLSTER BLD 10FT (ELECTRODE) ×3 IMPLANT
PK GRAFTLINK AUTO IMPLANT SYST (Anchor) ×3 IMPLANT
SET ARTHROSCOPY TUBING (MISCELLANEOUS) ×1
SET ARTHROSCOPY TUBING LN (MISCELLANEOUS) ×2 IMPLANT
SOL PREP POV-IOD 4OZ 10% (MISCELLANEOUS) ×3 IMPLANT
SPONGE GAUZE 4X4 12PLY STER LF (GAUZE/BANDAGES/DRESSINGS) ×3 IMPLANT
SPONGE LAP 18X18 X RAY DECT (DISPOSABLE) ×3 IMPLANT
SPONGE LAP 4X18 X RAY DECT (DISPOSABLE) ×3 IMPLANT
STRIP CLOSURE SKIN 1/2X4 (GAUZE/BANDAGES/DRESSINGS) ×3 IMPLANT
SUCTION FRAZIER HANDLE 10FR (MISCELLANEOUS) ×1
SUCTION TUBE FRAZIER 10FR DISP (MISCELLANEOUS) ×2 IMPLANT
SUT ETHILON 3 0 PS 1 (SUTURE) ×12 IMPLANT
SUT FIBERWIRE 2-0 18 17.9 3/8 (SUTURE)
SUT MENISCAL KIT (KITS) ×3 IMPLANT
SUT MNCRL AB 3-0 PS2 18 (SUTURE) ×6 IMPLANT
SUT VIC AB 0 CT1 27 (SUTURE) ×4
SUT VIC AB 0 CT1 27XBRD ANBCTR (SUTURE) ×8 IMPLANT
SUT VIC AB 2-0 CT1 27 (SUTURE) ×3
SUT VIC AB 2-0 CT1 TAPERPNT 27 (SUTURE) ×6 IMPLANT
SUT VIC AB 3-0 CT1 27 (SUTURE) ×2
SUT VIC AB 3-0 CT1 TAPERPNT 27 (SUTURE) ×4 IMPLANT
SUT VICRYL 0 UR6 27IN ABS (SUTURE) ×9 IMPLANT
SUTURE FIBERWR 2-0 18 17.9 3/8 (SUTURE) IMPLANT
SYR 20ML ECCENTRIC (SYRINGE) ×3 IMPLANT
SYR 30ML LL (SYRINGE) ×3 IMPLANT
SYR BULB IRRIGATION 50ML (SYRINGE) ×3 IMPLANT
SYR CONTROL 10ML LL (SYRINGE) IMPLANT
SYR TB 1ML LUER SLIP (SYRINGE) ×12 IMPLANT
SYSTEM GRAFT IMPLANT AUTOGRAFT (Anchor) ×2 IMPLANT
TOWEL OR 17X24 6PK STRL BLUE (TOWEL DISPOSABLE) ×3 IMPLANT
TOWEL OR 17X26 10 PK STRL BLUE (TOWEL DISPOSABLE) ×3 IMPLANT
TRAY CATH 16FR W/PLASTIC CATH (SET/KITS/TRAYS/PACK) ×3 IMPLANT
TUBE CONNECTING 12X1/4 (SUCTIONS) ×3 IMPLANT
UNDERPAD 30X30 (UNDERPADS AND DIAPERS) ×3 IMPLANT
WAND HAND CNTRL MULTIVAC 90 (MISCELLANEOUS) IMPLANT
WAND SERFAS ENERGY SUPER 90 (SURGICAL WAND) IMPLANT
WAND STAR VAC 90 (SURGICAL WAND) ×3 IMPLANT
WATER STERILE IRR 1000ML POUR (IV SOLUTION) ×3 IMPLANT
WRAP KNEE MAXI GEL POST OP (GAUZE/BANDAGES/DRESSINGS) ×3 IMPLANT
YANKAUER SUCT BULB TIP NO VENT (SUCTIONS) ×3 IMPLANT

## 2017-03-08 NOTE — Anesthesia Preprocedure Evaluation (Addendum)
Anesthesia Evaluation  Patient identified by MRN, date of birth, ID band Patient awake    Reviewed: Allergy & Precautions, NPO status , Patient's Chart, lab work & pertinent test results  Airway Mallampati: II  TM Distance: >3 FB Neck ROM: Full    Dental  (+) Teeth Intact, Dental Advisory Given   Pulmonary sleep apnea ,    breath sounds clear to auscultation       Cardiovascular negative cardio ROS   Rhythm:Regular Rate:Normal     Neuro/Psych    GI/Hepatic negative GI ROS, Neg liver ROS,   Endo/Other  negative endocrine ROS  Renal/GU negative Renal ROS     Musculoskeletal   Abdominal   Peds  Hematology  (+) anemia ,   Anesthesia Other Findings   Reproductive/Obstetrics                           Anesthesia Physical Anesthesia Plan  ASA: III  Anesthesia Plan: General   Post-op Pain Management:  Regional for Post-op pain   Induction: Intravenous  Airway Management Planned: Oral ETT  Additional Equipment:   Intra-op Plan:   Post-operative Plan: Possible Post-op intubation/ventilation  Informed Consent: I have reviewed the patients History and Physical, chart, labs and discussed the procedure including the risks, benefits and alternatives for the proposed anesthesia with the patient or authorized representative who has indicated his/her understanding and acceptance.   Dental advisory given  Plan Discussed with: CRNA and Anesthesiologist  Anesthesia Plan Comments:        Anesthesia Quick Evaluation

## 2017-03-08 NOTE — Progress Notes (Signed)
Paged ortho tech for CPM, ortho unable to place patient on our machine. Outside vendor for CPM has been notified and wont be able to get it over to [facility] until tomorrow.    Sharene Skeans, RN

## 2017-03-08 NOTE — Anesthesia Procedure Notes (Signed)
Procedure Name: Intubation Date/Time: 03/08/2017 1:15 PM Performed by: Julieta Bellini Pre-anesthesia Checklist: Patient identified, Emergency Drugs available, Suction available and Patient being monitored Patient Re-evaluated:Patient Re-evaluated prior to inductionOxygen Delivery Method: Circle system utilized Preoxygenation: Pre-oxygenation with 100% oxygen Intubation Type: IV induction Ventilation: Mask ventilation without difficulty Laryngoscope Size: Glidescope and 3 Grade View: Grade I Tube type: Oral Tube size: 7.0 mm Number of attempts: 1 Airway Equipment and Method: Stylet Placement Confirmation: ETT inserted through vocal cords under direct vision,  positive ETCO2 and breath sounds checked- equal and bilateral Secured at: 23 cm Tube secured with: Tape Dental Injury: Teeth and Oropharynx as per pre-operative assessment  Difficulty Due To: Difficulty was anticipated Future Recommendations: Recommend- induction with short-acting agent, and alternative techniques readily available

## 2017-03-08 NOTE — Transfer of Care (Signed)
Immediate Anesthesia Transfer of Care Note  Patient: Elaine Carr  Procedure(s) Performed: Procedure(s): RECONSTRUCTION ANTERIOR CRUCIATE LIGAMENT (ACL) WITH HAMSTRING GRAFT, MEDIAL AND LATERAL MENISCAL REPAIRS (Right) KNEE ARTHROSCOPY WITH MENISCAL REPAIR (Right)  Patient Location: PACU  Anesthesia Type:General  Level of Consciousness: drowsy  Airway & Oxygen Therapy: Patient Spontanous Breathing and Patient connected to face mask oxygen  Post-op Assessment: Report given to RN and Post -op Vital signs reviewed and stable  Post vital signs: Reviewed and stable  Last Vitals:  Vitals:   03/08/17 1056 03/08/17 1058  BP:  125/85  Pulse:  90  Resp:  20  Temp: 37.2 C     Last Pain:  Vitals:   03/08/17 1058  TempSrc: Oral      Patients Stated Pain Goal: 8 (62/03/55 9741)  Complications: No apparent anesthesia complications

## 2017-03-08 NOTE — Brief Op Note (Signed)
03/08/2017  6:24 PM  PATIENT:  Elaine Carr  21 y.o. female  PRE-OPERATIVE DIAGNOSIS:  RIGHT KNEE ANTERIOR CRUCIATE LIGAMENT TEAR, MEDIAL AND LATERAL MENISCUS TEAR  POST-OPERATIVE DIAGNOSIS:  RIGHT KNEE ANTERIOR CRUCIATE LIGAMENT TEAR   PROCEDURE:  Procedure(s): RECONSTRUCTION ANTERIOR CRUCIATE LIGAMENT (ACL) WITH HAMSTRING GRAFT, MEDIAL  MENISCAL REPAIRS KNEE ARTHROSCOPY WITH MENISCAL REPAIR  SURGEON:  Surgeon(s): Meredith Pel, MD  ASSISTANT: Laure Kidney rnfa  ANESTHESIA:   general  EBL: 25 ml    Total I/O In: 1700 [I.V.:1700] Out: 230 [Urine:200; Blood:30]  BLOOD ADMINISTERED: none  DRAINS: none   LOCAL MEDICATIONS USED:  Marcaine mso4 clonidine  SPECIMEN:  No Specimen  COUNTS:  YES  TOURNIQUET:   Total Tourniquet Time Documented: Thigh (Right) - 120 minutes Total: Thigh (Right) - 120 minutes   DICTATION: .Other Dictation: Dictation Number [id number]  PLAN OF CARE: Admit for overnight observation  PATIENT DISPOSITION:  PACU - hemodynamically stable

## 2017-03-08 NOTE — H&P (Signed)
Elaine Carr is an 21 y.o. female.   Chief Complaint: Right knee pain and instability HPI: Patient is a 21 year old female with right knee pain and instability.  She had an injury several months ago is been having pain since that time.  MRI scan shows anterior cruciate ligament tear and medial meniscal tear bucket-handle type along with possible lateral meniscal tear.  She presents now for operative management of these injuries.  She describes pain along with symptomatic instability in the knee.  She is morbidly obese but denies any history of DVT or pulmonary embolism.  Patient understands the risks and benefits and wishes to proceed with surgical intervention.  MRI scan does show anterior cruciate ligament tear along with medial and lateral meniscal tears.  Past Medical History:  Diagnosis Date  . ACL (anterior cruciate ligament) tear     and lateral, medial meniscal tear  . Anemia   . 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 Pre-diabetes   . Sleep apnea    wears CPAP    Past Surgical History:  Procedure Laterality Date  . TUMOR REMOVAL     stomach  . WISDOM TOOTH EXTRACTION      Family History  Problem Relation Age of Onset  . Cancer Other    Social History:  reports that she has never smoked. She has never used smokeless tobacco. She reports that she does not drink alcohol or use drugs.  Allergies:  Allergies  Allergen Reactions  . No Known Allergies     Medications Prior to Admission  Medication Sig Dispense Refill  . Cholecalciferol (VITAMIN D) 2000 units CAPS Take 2,000 Units by mouth daily.    . ferrous sulfate 325 (65 FE) MG EC tablet Take 325 mg by mouth 2 (two) times daily.    Marland Kitchen ibuprofen (ADVIL,MOTRIN) 200 MG tablet Take 400 mg by mouth daily as needed for headache or moderate pain.    . metFORMIN (GLUCOPHAGE-XR) 750 MG 24 hr tablet Take 750 mg by mouth 2 (two) times daily.    . Multiple Vitamin (MULTIVITAMIN WITH MINERALS) TABS tablet Take 1 tablet by mouth daily.     . meloxicam (MOBIC) 7.5 MG tablet Take 1 tablet (7.5 mg total) by mouth 2 (two) times daily after a meal. (Patient not taking: Reported on 03/04/2017) 30 tablet 1    Results for orders placed or performed during the hospital encounter of 03/08/17 (from the past 48 hour(s))  Glucose, capillary     Status: None   Collection Time: 03/08/17 11:02 AM  Result Value Ref Range   Glucose-Capillary 92 65 - 99 mg/dL   Comment 1 Notify RN    Comment 2 Document in Chart   hCG, serum, qualitative     Status: None   Collection Time: 03/08/17 11:03 AM  Result Value Ref Range   Preg, Serum NEGATIVE NEGATIVE    Comment:        THE SENSITIVITY OF THIS METHODOLOGY IS >10 mIU/mL.   CBC     Status: Abnormal   Collection Time: 03/08/17 11:03 AM  Result Value Ref Range   WBC 11.6 (H) 4.0 - 10.5 K/uL   RBC 4.40 3.87 - 5.11 MIL/uL   Hemoglobin 12.2 12.0 - 15.0 g/dL   HCT 38.3 36.0 - 46.0 %   MCV 87.0 78.0 - 100.0 fL   MCH 27.7 26.0 - 34.0 pg   MCHC 31.9 30.0 - 36.0 g/dL   RDW 16.8 (H) 11.5 - 15.5 %  Platelets 451 (H) 150 - 400 K/uL  Basic metabolic panel     Status: None   Collection Time: 03/08/17 11:03 AM  Result Value Ref Range   Sodium 138 135 - 145 mmol/L   Potassium 4.2 3.5 - 5.1 mmol/L   Chloride 104 101 - 111 mmol/L   CO2 25 22 - 32 mmol/L   Glucose, Bld 94 65 - 99 mg/dL   BUN 9 6 - 20 mg/dL   Creatinine, Ser 0.69 0.44 - 1.00 mg/dL   Calcium 9.4 8.9 - 10.3 mg/dL   GFR calc non Af Amer >60 >60 mL/min   GFR calc Af Amer >60 >60 mL/min    Comment: (NOTE) The eGFR has been calculated using the CKD EPI equation. This calculation has not been validated in all clinical situations. eGFR's persistently <60 mL/min signify possible Chronic Kidney Disease.    Anion gap 9 5 - 15   No results found.  Review of Systems  Musculoskeletal: Positive for joint pain.  All other systems reviewed and are negative.   Blood pressure 125/85, pulse 90, temperature 99 F (37.2 C), resp. rate 20,  height 5' 6.5" (1.689 m), weight (!) 427 lb 6 oz (193.9 kg), last menstrual period 01/26/2017, SpO2 99 %. Physical Exam  Constitutional: She appears well-developed.  HENT:  Head: Normocephalic.  Eyes: Pupils are equal, round, and reactive to light.  Neck: Normal range of motion.  Cardiovascular: Normal rate.   Respiratory: Effort normal.  Neurological: She is alert.  Skin: Skin is warm.  Psychiatric: She has a normal mood and affect.   Examination of the right knee demonstrates palpable pedal pulses slight valgus alignment stable collateral ligaments anterior cruciate ligament is out PCL is intact there is no posterior lateral rotatory instability noted she does have some pain with range of motion the knee but does achieve full extension and flexion past 90.  Has medial greater than lateral joint line tenderness  Assessment/Plan Impression is right knee anterior cruciate ligament tear with medial meniscal tear.  Plan is anterior cruciate ligament reconstruction using hamstring autograft with meniscal repair medially and laterally.  Medial meniscus is a bucket-handle tear involving the entire meniscus and it is flipped into the front of the joint.  Patient with her size it'll be important to try to attempt a repair of that meniscus.  Risks and benefits discussed including but not limited to infection or vessel damage failure of the meniscal repair failure of the ligamentous repair.  All this is likely related to her weight.  We will try to have a larger graft of for the harvest of this autograft.  Plan to keep the patient overnight for postop pain control as well as to begin mobilization with physical therapy..  All questions answered   Anderson Malta, MD 03/08/2017, 12:53 PM

## 2017-03-09 ENCOUNTER — Encounter (HOSPITAL_COMMUNITY): Payer: Self-pay | Admitting: Orthopedic Surgery

## 2017-03-09 DIAGNOSIS — S83511A Sprain of anterior cruciate ligament of right knee, initial encounter: Secondary | ICD-10-CM | POA: Diagnosis not present

## 2017-03-09 LAB — HEMOGLOBIN A1C
HEMOGLOBIN A1C: 6.1 % — AB (ref 4.8–5.6)
MEAN PLASMA GLUCOSE: 128 mg/dL

## 2017-03-09 MED ORDER — OXYCODONE HCL 5 MG PO TABS: ORAL_TABLET | ORAL | 0 refills | Status: DC

## 2017-03-09 MED ORDER — RIVAROXABAN 10 MG PO TABS: 10.0000 mg | ORAL_TABLET | Freq: Every day | ORAL | 0 refills | Status: DC

## 2017-03-09 MED ORDER — METHOCARBAMOL 500 MG PO TABS: 500.0000 mg | ORAL_TABLET | Freq: Four times a day (QID) | ORAL | 1 refills | Status: AC | PRN

## 2017-03-09 NOTE — Discharge Instructions (Signed)
Information on my medicine - XARELTO® (Rivaroxaban) ° °This medication education was reviewed with me or my healthcare representative as part of my discharge preparation.  The pharmacist that spoke with me during my hospital stay was:  Aryaan Persichetti Dien, RPH ° °Why was Xarelto® prescribed for you? °Xarelto® was prescribed for you to reduce the risk of blood clots forming after orthopedic surgery. The medical term for these abnormal blood clots is venous thromboembolism (VTE). ° °What do you need to know about xarelto® ? °Take your Xarelto® ONCE DAILY at the same time every day. °You may take it either with or without food. ° °If you have difficulty swallowing the tablet whole, you may crush it and mix in applesauce just prior to taking your dose. ° °Take Xarelto® exactly as prescribed by your doctor and DO NOT stop taking Xarelto® without talking to the doctor who prescribed the medication.  Stopping without other VTE prevention medication to take the place of Xarelto® may increase your risk of developing a clot. ° °After discharge, you should have regular check-up appointments with your healthcare provider that is prescribing your Xarelto®.   ° °What do you do if you miss a dose? °If you miss a dose, take it as soon as you remember on the same day then continue your regularly scheduled once daily regimen the next day. Do not take two doses of Xarelto® on the same day.  ° °Important Safety Information °A possible side effect of Xarelto® is bleeding. You should call your healthcare provider right away if you experience any of the following: °? Bleeding from an injury or your nose that does not stop. °? Unusual colored urine (red or dark brown) or unusual colored stools (red or black). °? Unusual bruising for unknown reasons. °? A serious fall or if you hit your head (even if there is no bleeding). ° °Some medicines may interact with Xarelto® and might increase your risk of bleeding while on Xarelto®. To help avoid  this, consult your healthcare provider or pharmacist prior to using any new prescription or non-prescription medications, including herbals, vitamins, non-steroidal anti-inflammatory drugs (NSAIDs) and supplements. ° °This website has more information on Xarelto®: www.xarelto.com. ° ° ° °

## 2017-03-09 NOTE — Progress Notes (Signed)
Orthopedic Tech Progress Note Patient Details:  Elaine Carr Jul 02, 1996 [id number]  Ortho Devices Type of Ortho Device: Knee Immobilizer Ortho Device/Splint Location: rle Ortho Device/Splint Interventions: Application   Sharlet Salina, Tashe Purdon 03/09/2017, 1:39 PM RN stated that she will put in order for replacement knee immobilizer

## 2017-03-09 NOTE — Progress Notes (Signed)
Orthopedic Tech Progress Note Patient Details:  Elaine Carr 1996-04-04 [id number]  Patient ID: Elaine Carr, female   DOB: Jan 31, 1996, 21 y.o.   MRN: [id number]   Elaine Carr 03/09/2017, 11:22 AM Pt's RN stated that order for cpm has been cancelled

## 2017-03-09 NOTE — Op Note (Signed)
NAME:  Elaine Carr, Elaine Carr                     ACCOUNT NO.:  MEDICAL RECORD NO.:  [id number]  LOCATION:                                 FACILITY:  PHYSICIAN:  Anderson Malta, M.D.         DATE OF BIRTH:  DATE OF PROCEDURE: DATE OF DISCHARGE:                              OPERATIVE REPORT   PREOPERATIVE DIAGNOSIS:  Right knee ACL tear, displaced bucket-handle medial meniscal tear.  POSTOPERATIVE DIAGNOSIS:  Right knee ACL tear, displaced bucket-handle medial meniscal tear.  PROCEDURES:  Right knee arthroscopy, autograft ACL reconstruction using a 9 mm graft using dual EndoButton technique with Arthrex followed by inside-out medial meniscal repair.  SURGEON:  Anderson Malta, M.D.  ASSIST:  Laure Kidney, RNFA.  INDICATIONS:  The patient is a 21 year old female injured her right knee several months ago.  MRI scan shows ACL tear and locked bucket-handle tear of the medial meniscus with the fragment flipped anteriorly.  She presents now for operative management after explanation of risks and benefits.  FINDINGS: 1. Examination under anesthesia:  Range of motion about 10 degrees of     hyperextension bilaterally with stable collateral ligament on the     right-hand side to varus and valgus stress at 0 and 30 degrees.     There was no posterolateral rotatory instability.  PCL intact, ACL     out. 2. Diagnostic arthroscopy:     a.     Intact patellofemoral compartment.     b.     Intact lateral compartment of articular cartilage and      meniscus.  The meniscus was stable.     c.     Torn ACL.  Intact PCL.     d.     Torn medial meniscus flipped into the anterior portion of      the joint.  This involved essentially the entire meniscus.  PROCEDURE IN DETAIL:  The patient was brought to the operating room, where general anesthetic was induced.  Preoperative antibiotics administered.  Time-out was called.  Left leg was placed in the lithotomy position with the peroneal nerve  well-padded.  The right leg was examined under anesthesia.  Tourniquet was placed.  The patient weighed 197 kg, which added in a significant way to the complexity of the case.  It also added to the complexity of the positioning.  The right leg was then placed within the leg holder and a tourniquet was placed proximally.  The knee was then prescrubbed with alcohol and Betadine, allowed to air dry, prepped with DuraPrep solution and draped in a sterile manner.  [name] was used to cover the operative field. About a 6 cm incision was made, centered over the gracilis and semitendinosus tendons.  Deep dissection was performed.  The semitendinosus tendon was then harvested with care being taken to avoid injury to the saphenous nerve.  This did take more time than usual because of the depth of the incision, which was required.  Graft was then prepared on the back table by Laure Kidney using dual EndoButton technique to a size 9 mm graft.  Concurrently, that incision was irrigated  and packed.  The leg was then elevated, exsanguinated with the Esmarch wrap.  Tourniquet was inflated.  Anteroinferior lateral, anteroinferior medial portals were created.  Diagnostic arthroscopy was performed.  The patient had intact patellofemoral lateral compartments, intact lateral meniscus, which was probed and found to be stable.  ACL was torn.  PCL intact.  Bucket-handle medial meniscal tear was present involving the entire meniscus flipped into the anterior compartment. The rim was then debrided with a shaver.  The meniscus was reduced. Notchplasty performed.  ACL stump debrided.  Following this, an incision was made measuring about 8 cm on the posteromedial aspect of the knee. The dissection was performed, avoiding injury to the saphenous vein and nerve.  Branches of that saphenous nerve were protected when possible. The posteromedial aspect of the capsule was then visualized.  At this time, using headlight  illumination, sutures were passed arthroscopically in vertical mattress and horizontal mattress alternating fashion through that meniscus.  In total, 9 sutures were passed.  This gave a good stable fixation.  Sutures were tied posterior to anterior with the knee in about 30 degrees of flexion.  This gave a good secure meniscal repair.  Following the tying of the sutures that incision was thoroughly irrigated.  Attention was then redirected towards the ACL.  The femoral tunnel was drilled at the 9 o'clock position using a 9 mm FlipCutter. Good position was obtained.  Similarly, FlipCutter was utilized to drill the tibial tunnel.  Graft was passed with StimuBlast placed into the tunnels.  Secured in full extension.  Good graft stability was achieved. At this time, thorough irrigation was performed of the knee and the incisions.  Tourniquet was released.  The harvest incision and the meniscal repair incision were closed using #1 Vicryl suture, 0 Vicryl suture, 2-0 Vicryl suture, and a 3-0 Monocryl.  It should be noted that during the retrieval process of the sutures, a large vaginal speculum was utilized in order to prevent injury to the posterior neurovascular structures.  Clear visualization was achieved with this technique.  The portal incisions were closed using 2-0 Vicryl and 3-0 nylon.  The knee was then injected with Marcaine, morphine, and clonidine.  The knee was then placed with impervious dressings, bulky Ace wrap, and then a knee immobilizer.  The patient tolerated the procedure well without immediate complications, transferred to the recovery room in stable condition.     Anderson Malta, M.D.     GSD/MEDQ  D:  03/08/2017  T:  03/08/2017  Job:  [id number]

## 2017-03-09 NOTE — Progress Notes (Signed)
Physical medicine rehabilitation consult requested chart reviewed. Patient currently unable to participate with therapies on initial evaluation after repair of right medial meniscal tear with noted morbid obesity. Will await formal physical therapy evaluation and follow-up with appropriate recommendations

## 2017-03-09 NOTE — Evaluation (Addendum)
Physical Therapy Evaluation Patient Details Name: Elaine Carr MRN: [id number] DOB: 01-21-1996 Today's Date: 03/09/2017   History of Present Illness  21 y.o. female admitted for R ACL with hamstring autograft, and medial/lateral meniscus repair. PMH of morbid obesity.  Clinical Impression  Pt admitted with above diagnosis. Pt currently with functional limitations due to the deficits listed below (see PT Problem List). +2 for stand pivot transfers. 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 Since she is not able to tolerate ambulation yet, WC is recommended. She would benefit from short CIR stay to maximize her functional mobility prior to DC.  Case Manager Manuela Schwartz is aware of pt's needs and is communicating with [employer] regarding accommodations for pt. Pt will benefit from skilled PT to increase their independence and safety with mobility to allow discharge to the venue listed below.       Follow Up Recommendations CIR    Equipment Recommendations  Bariatric Rolling walker with 5" wheels; bariatric 3in1 (PT);Wheelchair (measurements PT) with elevating leg rests    Recommendations for Other Services       Precautions / Restrictions Precautions Precautions: Fall Precaution Comments: 0-30* R knee flexion per CPM orders Required Braces or Orthoses: Knee Immobilizer - Right Restrictions RLE Weight Bearing: Touchdown weight bearing Other Position/Activity Restrictions: TDWB per ortho progress note      Mobility  Bed Mobility Overal bed mobility: Needs Assistance Bed Mobility: Rolling;Sidelying to Sit Rolling: Supervision Sidelying to sit: Min assist       General bed mobility comments: min A to support RLE, Verbal cues for technique  Transfers Overall transfer level: Needs assistance Equipment used: Rolling walker (2 wheeled) Transfers: Sit to/from  [facility] Sit to Stand: +2 safety/equipment;Mod assist Stand pivot transfers: Min assist;+2 safety/equipment       General transfer comment: verbal cues for set up/hand placement, increased time/effort, max encouragement needed; stand pivot transfer with bariRW x 3 trials  Ambulation/Gait Ambulation/Gait assistance: +2 safety/equipment;Min assist Ambulation Distance (Feet): 3 Feet Assistive device: Rolling walker (2 wheeled) Gait Pattern/deviations: Step-to pattern   Gait velocity interpretation: Below normal speed for age/gender General Gait Details: distance limited by pain/fatigue, VCs for TDWB and sequencing, +2 safety 2* WB status and morbid obesity  Stairs            Wheelchair Mobility    Modified Rankin (Stroke Patients Only)       Balance Overall balance assessment: Needs assistance   Sitting balance-Leahy Scale: Good     Standing balance support: Bilateral upper extremity supported Standing balance-Leahy Scale: Poor                               Pertinent Vitals/Pain Pain Assessment: 0-10 Pain Score: 9  Pain Location: R knee with activity Pain Descriptors / Indicators: Sore Pain Intervention(s): Limited activity within patient's tolerance;Monitored during session;Premedicated before session;Ice applied    Home Living Family/patient expects to be discharged to:: Other (Comment) (dorm at [employer])                 Additional Comments: lives with [employer], pt is freshman at [employer], her foster family is in [place]    Prior Function Level of Independence: Independent with assistive device(s)         Comments: used [facility] prior  to admission, will have to go up several steps to get into classroom buildings; dorm has elevator, classrooms on main floor     Hand Dominance        Extremity/Trunk Assessment   Upper Extremity Assessment Upper Extremity Assessment: Overall WFL for tasks assessed     Lower Extremity Assessment Lower Extremity Assessment: RLE deficits/detail RLE Deficits / Details: 0-5* AAROM R knee, limited by pain, pain with ankle PF/DF (only tolerates minimal AAROM), very weak quad set 1/5       Communication   Communication: No difficulties  Cognition Arousal/Alertness: Lethargic;Suspect due to medications Behavior During Therapy: [facility] for tasks assessed/performed Overall Cognitive Status: Within Functional Limits for tasks assessed                                 General Comments: increased time to process commands      General Comments      Exercises General Exercises - Lower Extremity Ankle Circles/Pumps: AAROM;Right;10 reps;Supine Quad Sets: AROM;Both;5 reps (only trace quad contraction RLE) Heel Slides: PROM;Right;5 reps   Assessment/Plan    PT Assessment Patient needs continued PT services  PT Problem List Decreased strength;Decreased range of motion;Decreased activity tolerance;Decreased balance;Decreased mobility;Obesity;Pain;Decreased knowledge of use of DME       PT Treatment Interventions DME instruction;Gait training;Stair training;Therapeutic exercise;Therapeutic activities;Patient/family education;Wheelchair mobility training;Functional mobility training    PT Goals (Current goals can be found in the Care Plan section)  Acute Rehab PT Goals Patient Stated Goal: return to classes at [facility] PT Goal Formulation: With patient Time For Goal Achievement: 03/23/17 Potential to Achieve Goals: Good    Frequency 7X/week   Barriers to discharge        Co-evaluation               End of Session Equipment Utilized During Treatment: Gait belt Activity Tolerance: Patient limited by fatigue;Patient limited by pain;Patient limited by lethargy Patient left: in chair;with call bell/phone within reach Nurse Communication: Mobility status PT Visit Diagnosis: Unsteadiness on feet (R26.81);Pain;Difficulty in walking,  not elsewhere classified (R26.2);Muscle weakness (generalized) (M62.81) Pain - Right/Left: Right Pain - part of body: Knee    Time: 1035-1220 PT Time Calculation (min) (ACUTE ONLY): 105 min   Charges:   PT Evaluation $PT Eval Moderate Complexity: 1 Procedure PT Treatments $Gait Training: 8-22 mins $Therapeutic Exercise: 8-22 mins $Therapeutic Activity: 38-52 mins   PT G Codes:   PT G-Codes **NOT FOR INPATIENT CLASS** Functional Assessment Tool Used: AM-PAC 6 Clicks Basic Mobility Functional Limitation: Mobility: Walking and moving around Mobility: Walking and Moving Around Current Status (O1308): At least 40 percent but less than 60 percent impaired, limited or restricted Mobility: Walking and Moving Around Goal Status [phone]): At least 1 percent but less than 20 percent impaired, limited or restricted    Philomena Doheny 03/09/2017, 12:50 PM [phone]

## 2017-03-09 NOTE — Anesthesia Postprocedure Evaluation (Signed)
Anesthesia Post Note  Patient: Elaine Carr  Procedure(s) Performed: Procedure(s) (LRB): RECONSTRUCTION ANTERIOR CRUCIATE LIGAMENT (ACL) WITH HAMSTRING GRAFT, MEDIAL AND LATERAL MENISCAL REPAIRS (Right) KNEE ARTHROSCOPY WITH MENISCAL REPAIR (Right)  Patient location during evaluation: Other Anesthesia Type: General Level of consciousness: awake and alert Pain management: pain level controlled Vital Signs Assessment: post-procedure vital signs reviewed and stable Respiratory status: spontaneous breathing, nonlabored ventilation, respiratory function stable and patient connected to nasal cannula oxygen Cardiovascular status: blood pressure returned to baseline and stable Postop Assessment: no signs of nausea or vomiting Anesthetic complications: no       Last Vitals:  Vitals:   03/08/17 2027 03/09/17 0620  BP: 140/79 (!) 121/47  Pulse: 94 (!) 115  Resp: 18 18  Temp: 36.5 C 37.2 C    Last Pain:  Vitals:   03/09/17 0620  TempSrc: Oral  PainSc:                  [name]

## 2017-03-09 NOTE — Care Management Note (Signed)
Case Management Note  Patient Details  Name: Elaine Carr MRN: [id number] Date of Birth: January 04, 1996  Subjective/Objective:  21 yr old female s/p repair of right meniscus tear.                 Action/Plan: Case manager spoke at length with patient concerning discharge needs. 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 Patient will need accommodations for bariatric 3in1 in bathroom  and Walker. CM has call out to [employer], Scientist, physiological for [employer] at [employer]. Patient also provided number for  Eulah Pont [phone] who works with students with disabilities. She can not be reached until 4:30pm.  PT and OT have recommended CIR, CM has spoken with Dr. Marlou Sa. Will continue to assess.    Expected Discharge Date:  03/09/17               Expected Discharge Plan:     In-House Referral:     Discharge planning Services  CM Consult  Post Acute Care Choice:  Durable Medical Equipment Choice offered to:  Patient  DME Arranged:  [place]ford Rile wide, 3-N-1, Wheelchair manual DME Agency:  [place]:    [place] Agency:     Status of Service:  In process, will continue to follow  If discussed at Long Length of Stay Meetings, dates discussed:    Additional Comments:  Ninfa Meeker, RN 03/09/2017, 12:26 PM

## 2017-03-09 NOTE — Evaluation (Signed)
Occupational Therapy Evaluation Patient Details Name: Elaine Carr MRN: [id number] DOB: 26-May-1996 Today's Date: 03/09/2017    History of Present Illness 21 y.o. female admitted for R ACL with hamstring autograft, and medial/lateral meniscus repair. PMH of morbid obesity.   Clinical Impression   Pt admitted for R ACL tear in which pt had sx 3/27.  Pts pain, WB status and obesity challenges at this time. Pt currently with functional limitations due to the deficits listed below (see OT Problem List).  Pt will benefit from skilled OT to increase their safety and independence with ADL and functional mobility for ADL to facilitate discharge to venue listed below.   Pt lives in a dorm room with small doors, low toilet and step over tub.  Pts dorm building does have an elevator but all of campus buildings do not have an elevator.  Pt will need handicap accessible living arrangement in which pt will have a wheelchair, elevated commode with arms and shower bench  Pt will benefit from post acute rehab to increase I with ADL activity prior to going back to her college.         Follow Up Recommendations  CIR    Equipment Recommendations  Other (comment) (TBD- possibly bariatric BSC)       Precautions / Restrictions Precautions Precautions: Fall Required Braces or Orthoses: Knee Immobilizer - Right Restrictions RLE Weight Bearing: Touchdown weight bearing Other Position/Activity Restrictions: TDWB per ortho progress note      Mobility Bed Mobility Overal bed mobility: Needs Assistance Bed Mobility: Rolling;Sidelying to Sit Rolling: Supervision Sidelying to sit: Min assist       General bed mobility comments: pt in chair after PT session  Transfers Overall transfer level: Needs assistance Equipment used: Rolling walker (2 wheeled) Transfers: Sit to/from [facility] Sit to Stand: +2 safety/equipment;Mod assist Stand pivot transfers: Min assist;+2  safety/equipment       General transfer comment: pts pain increased and pt not able to attempt sit to stand at this time.    Balance Overall balance assessment: Needs assistance   Sitting balance-Leahy Scale: Good     Standing balance support: Bilateral upper extremity supported Standing balance-Leahy Scale: Poor                             ADL either performed or assessed with clinical judgement   ADL Overall ADL's : Needs assistance/impaired Eating/Feeding: Set up;Sitting   Grooming: Set up;Sitting   Upper Body Bathing: Moderate assistance;Sitting   Lower Body Bathing: +2 for safety/equipment;+2 for physical assistance;Total assistance;Sitting/lateral leans;Cueing for sequencing;Cueing for safety;With adaptive equipment   Upper Body Dressing : Moderate assistance;Sitting   Lower Body Dressing: +2 for safety/equipment;+2 for physical assistance     Toilet Transfer Details (indicate cue type and reason): did not perform.  Portable did bring bariatric commode for pt to try as she is able Toileting- Clothing Manipulation and Hygiene: Sitting/lateral lean;Cueing for sequencing;Cueing for safety;+2 for physical assistance;+2 for safety/equipment;Total assistance         General ADL Comments: pt overall max- total A with toileting and bathing at this time.  Pt is very limited due to WB status and pain.  Spoke with Rn case manager regarding pts dorm bathroom (small doors and low toilet) It does not sound like a walker will be able to fit in the door.      Vision Patient Visual Report: No change from baseline  Pertinent Vitals/Pain Pain Assessment: 0-10 Pain Score: 10-Worst pain ever Pain Location:  R knee with movement/repositioning Pain Descriptors / Indicators: Sore;Guarding Pain Intervention(s): Limited activity within patient's tolerance;Monitored during session;Repositioned;Ice applied     Hand Dominance     Extremity/Trunk Assessment  Upper Extremity Assessment Upper Extremity Assessment: Overall WFL for tasks assessed   Lower Extremity Assessment Lower Extremity Assessment: RLE deficits/detail RLE Deficits / Details: 0-5* AAROM R knee, limited by pain, pain with ankle PF/DF (only tolerates minimal AAROM), very weak quad set 1/5       Communication Communication Communication: No difficulties   Cognition Arousal/Alertness: Awake/alert Behavior During Therapy: WFL for tasks assessed/performed Overall Cognitive Status: Within Functional Limits for tasks assessed                                 General Comments: increased time to process commands              Home Living Family/patient expects to be discharged to:: Other (Comment) (dorm at [employer])                   [employer] Shower/Tub: Tub/shower unit (small door)   Biochemist, clinical: Standard (with a small door and low toilet)     Home Equipment: None   Additional Comments: lives with [employer], pt is Museum/gallery exhibitions officer at [employer], her foster family is in [place]      Prior Functioning/Environment Level of Independence: Independent with assistive device(s)        Comments: used SPC prior to admission, will have to go up several steps to get into classroom buildings; dorm has elevator, classrooms on main floor        OT Problem List: Decreased strength;Decreased activity tolerance;Impaired balance (sitting and/or standing);Decreased safety awareness;Decreased knowledge of precautions;Decreased knowledge of use of DME or AE;Pain      OT Treatment/Interventions: Self-care/ADL training;DME and/or AE instruction;Patient/family education    OT Goals(Current goals can be found in the care plan section) Acute Rehab OT Goals Patient Stated Goal: return to classes at [employer] OT Goal Formulation: With patient Time For Goal Achievement: 03/23/17 Potential to Achieve Goals: Good  OT Frequency: Min 3X/week   Barriers  to D/C: Decreased caregiver support;Inaccessible home environment          Co-evaluation              End of Session Equipment Utilized During Treatment: Rolling walker;Right knee immobilizer Nurse Communication: Mobility status  Activity Tolerance: Patient limited by pain Patient left: in chair;with call bell/phone within reach  OT Visit Diagnosis: Other abnormalities of gait and mobility (R26.89);Pain;Unsteadiness on feet (R26.81) Pain - Right/Left: Right Pain - part of body: Leg                Time: 4098-1191 OT Time Calculation (min): 15 min Charges:  OT General Charges $OT Visit: 1 Procedure OT Evaluation $OT Eval Moderate Complexity: 1 Procedure G-Codes: OT G-codes **NOT FOR INPATIENT CLASS** Functional Assessment Tool Used: Clinical judgement Functional Limitation: Self care Self Care Current Status (Y7829): At least 80 percent but less than 100 percent impaired, limited or restricted Self Care Goal Status (F6213): At least 1 percent but less than 20 percent impaired, limited or restricted   Kari Baars, [place]  Payton Mccallum D 03/09/2017, 1:43 PM

## 2017-03-09 NOTE — Progress Notes (Signed)
Orthopedic Tech Progress Note Patient Details:  DELANA MANGANELLO 1996/06/29 [id number]  Patient ID: Lowry Bowl, female   DOB: 10-26-1996, 21 y.o.   MRN: [id number]   Hildred Priest 03/09/2017, 1:18 PM RN stated that cpm order has been cancelled

## 2017-03-09 NOTE — Progress Notes (Signed)
Inpatient Rehabilitation  Patient was screened by Gunnar Fusi for appropriateness for an Inpatient Acute Rehab consult.  Note that consult has been ordered.  Plan to follow up after MD has completed consult.    Carmelia Roller., CCC/SLP Admission Coordinator  [facility]  Cell [phone]

## 2017-03-09 NOTE — Progress Notes (Signed)
Subjective: Patient stable.  Pain controlled.  No issues off CPAP last night with sleeping or oxygenation.  CPM machine did not fit the patient from Hospital.  Outside Vendor Notified and Will Put One on This Morning   Objective: Vital signs in last 24 hours: Temp:  [97.5 F (36.4 C)-99 F (37.2 C)] 98.9 F (37.2 C) (03/28 0620) Pulse Rate:  [88-115] 115 (03/28 0620) Resp:  [7-23] 18 (03/28 0620) BP: (117-156)/(47-93) 121/47 (03/28 0620) SpO2:  [97 %-100 %] 97 % (03/28 0620) FiO2 (%):  [0 %] 0 % (03/27 2055) Weight:  [427 lb 6 oz (193.9 kg)] 427 lb 6 oz (193.9 kg) (03/27 1058)  Intake/Output from previous day: 03/27 0701 - 03/28 0700 In: 3121.3 [P.O.:720; I.V.:2201.3; IV Piggyback:200] Out: 230 [Urine:200; Blood:30] Intake/Output this shift: No intake/output data recorded.  Exam:  Sensation intact distally Intact pulses distally Dorsiflexion/Plantar flexion intact  Labs:  Recent Labs  03/08/17 1103  HGB 12.2    Recent Labs  03/08/17 1103  WBC 11.6*  RBC 4.40  HCT 38.3  PLT 451*    Recent Labs  03/08/17 1103  NA 138  K 4.2  CL 104  CO2 25  BUN 9  CREATININE 0.69  GLUCOSE 94  CALCIUM 9.4   No results for input(s): LABPT, INR in the last 72 hours.  Assessment/Plan: Plan at this time is for CPM machine this morning for 1-2 hours.  Physical therapy this morning as well for touchdown weightbearing for balance on that right leg.  After that patient can be discharged.  Please remove Ace wrap from leg prior to discharge.  Dressings underneath our waterproof.  Plan to discharge patient on pain medicine which is not to be taken after 9 p.m. at night along with Robaxin and Xarelto for DVT prophylaxis   G Alphonzo Severance 03/09/2017, 7:18 AM

## 2017-03-09 NOTE — Progress Notes (Signed)
PT Cancellation Note  Patient Details Name: JONIKA CRITZ MRN: [id number] DOB: 1996-05-01   Cancelled Treatment:    Reason Eval/Treat Not Completed: Pain limiting ability to participate (pt reports severe pain with minimal movement of RLE, pain meds requested, will return this morning to re-attempt PT eval. )   Philomena Doheny 03/09/2017, 9:25 AM [phone]

## 2017-03-09 NOTE — Care Management (Signed)
Case manager received call back from Lorrine Kin, Pharmacist, community at  [employer]. We discussed accommodations  That patient will need upon returning to her dorm. Ms. Ronnald Ramp states she can provide a room on the first floor and they have elevators in the dorm. Ms. Ronnald Ramp asked that patient contact her friends to move her belongings to new location. CM did speak with patient concerning this. Hopefully patient will be able to go to CIR prior to discharge back to Dorm. CM has also spoken with DME Liaison concerning bariatric wheelchair and 3in1 needing to be delivered to [facility] for patient.

## 2017-03-09 NOTE — Progress Notes (Signed)
PT Cancellation Note  Patient Details Name: Elaine Carr MRN: [id number] DOB: [birth year]   Cancelled Treatment:    Reason Eval/Treat Not Completed: Other (comment);Fatigue/lethargy limiting ability to participate (Pt's knee immobilizer is not large enough to close, phoned ortho tech who will bring another one. Pt very lethargic/groggy, responds briefly verbally but then falls right back asleep. Will attempt again later today. )   Philomena Doheny 03/09/2017, 10:55 AM [phone]

## 2017-03-10 DIAGNOSIS — S83511A Sprain of anterior cruciate ligament of right knee, initial encounter: Secondary | ICD-10-CM | POA: Diagnosis not present

## 2017-03-10 NOTE — Progress Notes (Signed)
Message sent to Dr. Lorin Mercy, on call for Dr. Marlou Sa for benadryl or something for itching.

## 2017-03-10 NOTE — Progress Notes (Signed)
Dr. Marlou Sa in to see patient and wants cpap to be ordered for patient after she finds out what her settings are.  Ortho Tech called for [employer] brace.  Linna Hoff will Futures trader.

## 2017-03-10 NOTE — Progress Notes (Signed)
qPhysical Therapy Treatment Patient Details Name: Elaine Carr MRN: [id number] DOB: 1996/04/05 Today's Date: 03/10/2017    History of Present Illness 21 y.o. female admitted for R ACL with hamstring autograft, and medial/lateral meniscus repair. PMH of morbid obesity.    PT Comments    Pt making minimal progress towards modified independence in bed mobility, transfers and WC management to be able to safely navigate her school environment. Pt is unable/unsafe to get into and out of bed without assistance. Pt transfers take increased time decreasing pt stability and safety. Pt is not able to manage foot rest of wheel chair to support R LE or to propel herself greater than 10 feet at a time without requiring rest. PT and OT spent extended time to try to train pt to level of independence to be able to discharge back to her dorm room however at this time pt is not capable of safely mobilizing independently and maintaining NWB status of her R LE. PT suggests d/c to SNF for PT to progress bed mobility, transfers and wheel chair management and to improve strength to be able to go back to school safely.     Follow Up Recommendations  SNF     Equipment Recommendations  Rolling walker with 5" wheels;3in1 (PT);Wheelchair (measurements PT);Wheelchair cushion (measurements PT)    Recommendations for Other Services OT consult     Precautions / Restrictions Precautions Precautions: Knee Required Braces or Orthoses: Knee Immobilizer - Right Knee Immobilizer - Right: On at all times Restrictions Weight Bearing Restrictions: Yes RLE Weight Bearing: Non weight bearing Other Position/Activity Restrictions: TDWB per ortho progress note    Mobility  Bed Mobility Overal bed mobility: Needs Assistance Bed Mobility: Sit to Supine       Sit to supine: Min guard   General bed mobility comments: with increased time and major effort on pt's side using leg lifter  Transfers Overall transfer level:  Needs assistance Equipment used: Rolling walker (2 wheeled) Transfers: Sit to/from [facility] Sit to Stand: Min assist;+2 safety/equipment Stand pivot transfers: Min assist;+2 safety/equipment       General transfer comment: had pt try and manage W/C by herself which she struggled with as well as she is unable to manage the legs rests by herself     Information systems manager mobility: Yes Wheelchair propulsion: Both upper extremities;Left lower extremity Wheelchair parts: Needs assistance Distance: 100 Wheelchair Assistance Details (indicate cue type and reason): pt required min assistance for steering to avoid obsticals, vc for hand placement, utilizing L LE for propulsion, Pt required numerous rest breaks due to fatigue      Balance Overall balance assessment: Needs assistance Sitting-balance support: No upper extremity supported;Feet supported Sitting balance-Leahy Scale: Good     Standing balance support: Bilateral upper extremity supported;During functional activity Standing balance-Leahy Scale: Poor Standing balance comment: Heavy reliance on RW in standing                            Cognition Arousal/Alertness: Lethargic                                     General Comments: Kept closing her eyes on [place], saying she was tired and woozy (BP checked 127/64 and O2 sats 92% on RA)             Pertinent Vitals/Pain Pain  Assessment: 0-10 Pain Score: 5  Pain Location: RLE Pain Descriptors / Indicators: Guarding;Grimacing Pain Intervention(s): Limited activity within patient's tolerance;Monitored during session;Repositioned           PT Goals (current goals can now be found in the care plan section) Acute Rehab PT Goals PT Goal Formulation: With patient Time For Goal Achievement: 03/23/17 Potential to Achieve Goals: Good Progress towards PT goals: Progressing toward goals (pt is making very slow  progress towards goals)    Frequency    Min 6X/week      PT Plan Discharge plan needs to be updated    Co-evaluation PT/OT/SLP Co-Evaluation/Treatment: Yes Reason for Co-Treatment: For patient/therapist safety PT goals addressed during session: Mobility/safety with mobility;Proper use of DME OT goals addressed during session: ADL's and self-care;Strengthening/ROM;Proper use of Adaptive equipment and DME     End of Session Equipment Utilized During Treatment: Right knee immobilizer Activity Tolerance: Patient limited by lethargy;Patient limited by fatigue;Patient limited by pain Patient left: in bed;with call bell/phone within reach Nurse Communication: Mobility status (inability to be modified independent to return to school) PT Visit Diagnosis: Pain;Difficulty in walking, not elsewhere classified (R26.2);Unsteadiness on feet (R26.81) Pain - Right/Left: Right Pain - part of body: Knee     Time: 5003-7048 PT Time Calculation (min) (ACUTE ONLY): 136 min  Charges:  $Gait Training: 8-22 mins $Therapeutic Activity: 53-67 mins                    G Codes:  Functional Assessment Tool Used: AM-PAC 6 Clicks Basic Mobility Functional Limitation: Mobility: Walking and moving around Mobility: Walking and Moving Around Current Status (G8916): At least 40 percent but less than 60 percent impaired, limited or restricted Mobility: Walking and Moving Around Goal Status [phone]): At least 1 percent but less than 20 percent impaired, limited or restricted    Benjamine Mola B. Migdalia Dk PT, DPT Acute Rehabilitation  [phone] Pager [phone]    [place] 03/10/2017, 5:01 PM

## 2017-03-10 NOTE — Progress Notes (Signed)
Pt needs cpap and bledsoe brace for ambulation in extension prior to dc

## 2017-03-10 NOTE — Progress Notes (Signed)
Chart reviewed, discussed with rehab PA.  21 yo female with ACL repair.  Has morbid obesity but no other comorbidities that require day to day management by rehab MD.  Borderline DM stable to metformin. Does not require CIR level.

## 2017-03-10 NOTE — Progress Notes (Signed)
Subjective: Pt stable - doing ok with pt    Objective: Vital signs in last 24 hours: Temp:  [98.4 F (36.9 C)-100.3 F (37.9 C)] 98.4 F (36.9 C) (03/29 0420) Pulse Rate:  [89-111] 111 (03/29 0420) Resp:  [17-20] 20 (03/29 0420) BP: (123-140)/(64-75) 123/68 (03/29 0420) SpO2:  [91 %-100 %] 100 % (03/29 0420)  Intake/Output from previous day: 03/28 0701 - 03/29 0700 In: 300 [I.V.:300] Out: -  Intake/Output this shift: No intake/output data recorded.  Exam:  Dorsiflexion/Plantar flexion intact  Labs:  Recent Labs  03/08/17 1103  HGB 12.2    Recent Labs  03/08/17 1103  WBC 11.6*  RBC 4.40  HCT 38.3  PLT 451*    Recent Labs  03/08/17 1103  NA 138  K 4.2  CL 104  CO2 25  BUN 9  CREATININE 0.69  GLUCOSE 94  CALCIUM 9.4   No results for input(s): LABPT, INR in the last 72 hours.  Assessment/Plan: Plan dc home today   Anderson Malta 03/10/2017, 11:23 AM

## 2017-03-10 NOTE — Clinical Social Work Note (Signed)
Clinical Social Work Assessment  Patient Details  Name: Elaine Carr MRN: [id number] Date of Birth: 1996/07/26  Date of referral:  03/10/17               Reason for consult:  Facility Placement                Permission sought to share information with:  Facility Sport and exercise psychologist, Case Manager Permission granted to share information::  Yes, Verbal Permission Granted  Name::     Mahlon Gammon  Agency::  SNF  Relationship::  [phone]; [phone]  Contact Information:     Housing/Transportation Living arrangements for the past 2 months:   (College dorm) Source of Information:  Patient Patient Interpreter Needed:  None Criminal Activity/Legal Involvement Pertinent to Current Situation/Hospitalization:  No - Comment as needed Significant Relationships:  Other(Comment) (Education officer, museum) Lives with:  Self Do you feel safe going back to the place where you live?  No Need for family participation in patient care:  No (Coment)  Care giving concerns:  Pt has no support at her dorm for physical assist and currently well below baseline with mobility.     Social Worker assessment / plan:  CSW met with pt to discuss PT recommendation for rehab at time of DC. Pt somewhat lethargic at beginning of interview but became more alert over time- answered questions appropriately.  CSW explained medical team concerns with pt return to dorm in current condition and their recommendation for an inpatient rehab program prior to returning to school.  CSW explained SNF and SNF referral process.  CSW also obtained information for pt social worker in [place] (pt was in foster program there and still has Education officer, museum following) and received verbal permission to speak with her.  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  Patient/Family's Understanding of and Emotional Response to Diagnosis, Current Treatment, and Prognosis:  Patient seems to comprehend her current medical situation and is realistic about her need for higher level of care at this time- hopeful she will return to baseline with rehab stay.  Emotional Assessment Appearance:  Appears stated age Attitude/Demeanor/Rapport:  Lethargic Affect (typically observed):  Appropriate Orientation:  Oriented to Place, Oriented to Self, Oriented to  Time, Oriented to Situation Alcohol / Substance use:  Not Applicable Psych involvement (Current and /or in the community):  No (Comment)  Discharge Needs  Concerns to be addressed:  Care Coordination Readmission within the last 30 days:  No Current discharge risk:  Lives alone, Physical Impairment Barriers to Discharge:  Continued Medical Work up, [employer]   Jorge Ny, LCSW 03/10/2017, 3:20 PM

## 2017-03-10 NOTE — Progress Notes (Signed)
Inpatient Rehabilitation  Patient with no medical necessity per Rehab MD as a result, consult will not be completed and we will sign off at this time.    Carmelia Roller., CCC/SLP Admission Coordinator  [facility]  Cell [phone]

## 2017-03-10 NOTE — Progress Notes (Signed)
RN called requesting cpap for patient who was supposed to be d/c last night but was not and is having a headache and is "sleepy" today. Unfortunately there are no cpap's available at this time. RN made aware and RT will provide cpap later when one becomes available. RT will continue to monitor.

## 2017-03-10 NOTE — Progress Notes (Signed)
Orthopedic Tech Progress Note Patient Details:  EOLA WALDREP 1996/01/12 [id number]  Ortho Devices Type of Ortho Device: Other (comment) Ortho Device/Splint Location: Call in bledsoe brace Order to [employer]. Ortho Device/Splint Interventions: Application  Called in bledsoe brace to [employer].   Kristopher Oppenheim 03/10/2017, 12:16 PM

## 2017-03-10 NOTE — NC FL2 (Signed)
[place] LEVEL OF CARE SCREENING TOOL     IDENTIFICATION  Patient Name: Elaine Carr Birthdate: [birth year] Sex: female Admission Date (Current Location): 03/08/2017  [facility] and [place] Number:  Herbalist and Address:  [facility], [place] [address], [place], [place] [address]      Provider Number: [id number]  Attending Physician Name and Address:  Meredith Pel, MD  Relative Name and Phone Number:       Current Level of Care: Hospital Recommended Level of Care: [place] Prior Approval Number:    Date Approved/Denied:   PASRR Number: [id number]  Discharge Plan: SNF    Current Diagnoses: Patient Active Problem List   Diagnosis Date Noted  . ACL tear 03/08/2017    Orientation RESPIRATION BLADDER Height & Weight     Self, Time, Situation, Place  Other (Comment), O2 (CPAP at night- will need facility to order one, see DC summary for additional O2 needs) Continent Weight: (!) 427 lb 6 oz (193.9 kg) Height:  5' 6.5" (168.9 cm)  BEHAVIORAL SYMPTOMS/MOOD NEUROLOGICAL BOWEL NUTRITION STATUS      Continent Diet (see DC summary)  AMBULATORY STATUS COMMUNICATION OF NEEDS Skin   Extensive Assist Verbally Surgical wounds                       Personal Care Assistance Level of Assistance  Bathing, Dressing Bathing Assistance: Maximum assistance   Dressing Assistance: Maximum assistance     Functional Limitations Info             SPECIAL CARE FACTORS FREQUENCY  PT (By licensed PT), OT (By licensed OT)     PT Frequency: 5/wk OT Frequency: 5/wk            Contractures      Additional Factors Info  Code Status, Allergies Code Status Info: FULL Allergies Info: NKA           Current Medications (03/10/2017):  This is the current hospital active medication list Current Facility-Administered Medications  Medication Dose Route Frequency Provider Last Rate Last Dose  . acetaminophen  (TYLENOL) tablet 650 mg  650 mg Oral Q6H PRN Meredith Pel, MD   650 mg at 03/10/17 0654   Or  . acetaminophen (TYLENOL) suppository 650 mg  650 mg Rectal Q6H PRN Meredith Pel, MD      . cholecalciferol (VITAMIN D) tablet 2,000 Units  2,000 Units Oral Daily Meredith Pel, MD   2,000 Units at 03/10/17 1028  . ferrous sulfate tablet 325 mg  325 mg Oral BID Meredith Pel, MD   325 mg at 03/10/17 1028  . ibuprofen (ADVIL,MOTRIN) tablet 400 mg  400 mg Oral Daily PRN Meredith Pel, MD   400 mg at 03/09/17 0242  . lactated ringers infusion   Intravenous Continuous Belinda Block, MD 10 mL/hr at 03/08/17 1127    . metFORMIN (GLUCOPHAGE-XR) 24 hr tablet 750 mg  750 mg Oral BID WC Meredith Pel, MD   750 mg at 03/10/17 1103  . methocarbamol (ROBAXIN) tablet 500 mg  500 mg Oral Q6H PRN Meredith Pel, MD   500 mg at 03/10/17 1028   Or  . methocarbamol (ROBAXIN) 500 mg in dextrose 5 % 50 mL IVPB  500 mg Intravenous Q6H PRN Meredith Pel, MD      . metoCLOPramide (REGLAN) tablet 5-10 mg  5-10 mg Oral Q8H PRN Tonna Corner  Marlou Sa, MD       Or  . metoCLOPramide (REGLAN) injection 5-10 mg  5-10 mg Intravenous Q8H PRN Meredith Pel, MD      . morphine 4 MG/ML injection 4 mg  4 mg Intravenous Q3H PRN Meredith Pel, MD   4 mg at 03/10/17 1143  . multivitamin with minerals tablet 1 tablet  1 tablet Oral Daily Meredith Pel, MD   1 tablet at 03/10/17 1028  . ondansetron (ZOFRAN) tablet 4 mg  4 mg Oral Q6H PRN Meredith Pel, MD       Or  . ondansetron [facility]) injection 4 mg  4 mg Intravenous Q6H PRN Meredith Pel, MD      . oxyCODONE (Oxy IR/ROXICODONE) immediate release tablet 5-10 mg  5-10 mg Oral Q3H PRN Meredith Pel, MD   10 mg at 03/10/17 1453  . rivaroxaban (XARELTO) tablet 10 mg  10 mg Oral Daily Meredith Pel, MD   10 mg at 03/10/17 1029     Discharge Medications: Please see discharge summary for a list of discharge  medications.  Relevant Imaging Results:  Relevant Lab Results:   Additional Information SS#: [id number]  Jorge Ny, LCSW

## 2017-03-10 NOTE — Progress Notes (Signed)
[employer] Rep here to apply bledsoe brace. Call to Dr. Marlou Sa to clarify if patient can remove brace to shower or if needs to keep it on all of the time.

## 2017-03-10 NOTE — Progress Notes (Signed)
Occupational Therapy Treatment Patient Details Name: SAMAIA IWATA MRN: [id number] DOB: March 12, 1996 Today's Date: 03/10/2017    History of present illness 21 y.o. female admitted for R ACL with hamstring autograft, and medial/lateral meniscus repair. PMH of morbid obesity.   OT comments  This 21 yo female admitted for above presents to acute OT making minimal progress towards her ability to be Mod I to be at dorm room mainly alone. Major factors are her obesity and TDWM on RLE. She is currently unsafe/unable to get in and OOB by herself, manage her RLE with transfers (she is maintaining [place] but needs VCs for stand>sit to hold leg up). She is unable to manage her basic ADLs of B/D/toileting. She really needs ST SNF stay to get her where she can get to a Mod I to return to dorm at [employer]. A lot of time spent with pt to see if we (OT/PT) could get  her to a level to go home, but we do not feel she is capable or safe at this time.  Follow Up Recommendations  SNF;Supervision/Assistance - 24 hour    Equipment Recommendations  Other (comment) (bariatric 3n1, hospital bed, wheelchair with elevating leg rests)       Precautions / Restrictions Precautions Precautions: Fall Required Braces or Orthoses: Knee Immobilizer - Right Knee Immobilizer - Right: On at all times Restrictions Weight Bearing Restrictions: Yes RLE Weight Bearing: Touchdown weight bearing Other Position/Activity Restrictions: TDWB per ortho progress note       Mobility Bed Mobility Overal bed mobility: Needs Assistance Bed Mobility: Sit to Supine       Sit to supine: Min guard   General bed mobility comments: with increased time and major effort on pt's side using leg lifter  Transfers Overall transfer level: Needs assistance Equipment used: Rolling walker (2 wheeled) Transfers: Sit to/from [facility] Sit to Stand: Min assist;+2 safety/equipment Stand pivot transfers: Min assist;+2  safety/equipment       General transfer comment: had pt try and manage W/C by herself which she struggled with as well as she is unable to manage the legs rests by herself    Balance Overall balance assessment: Needs assistance Sitting-balance support: No upper extremity supported;Feet supported Sitting balance-Leahy Scale: Good     Standing balance support: Bilateral upper extremity supported;During functional activity Standing balance-Leahy Scale: Poor Standing balance comment: Heavy reliance on RW in standing                           ADL either performed or assessed with clinical judgement   ADL Overall ADL's : Needs assistance/impaired                     Lower Body Dressing: Maximal assistance Lower Body Dressing Details (indicate cue type and reason): +2 min A for safety sit<>stand; using 2 reachers to don underwear; pants she had here would not fit over brace Toilet Transfer: Minimal assistance;+2 for safety/equipment;Stand-pivot   Toileting- Clothing Manipulation and Hygiene: Minimal assistance Toileting - Clothing Manipulation Details (indicate cue type and reason): +2 for sit<>stand (min A for safety) and standing balance in standing while pulling up underwear                       Cognition Arousal/Alertness: Lethargic  General Comments: Kept closing her eyes on [place], saying she was tired and woozy (BP checked 127/64 and O2 sats 92% on RA)                   Pertinent Vitals/ Pain       Pain Assessment: 0-10 Pain Score: 5  Pain Location: RLE Pain Descriptors / Indicators: Guarding;Grimacing Pain Intervention(s): Limited activity within patient's tolerance;Monitored during session;Repositioned         Frequency  Min 3X/week        Progress Toward Goals  OT Goals(current goals can now be found in the care plan section)  Progress towards OT goals: Progressing toward  goals     Plan Discharge plan needs to be updated    Co-evaluation    PT/OT/SLP Co-Evaluation/Treatment: Yes (partial) Reason for Co-Treatment: For patient/therapist safety;To address functional/ADL transfers   OT goals addressed during session: ADL's and self-care;Strengthening/ROM;Proper use of Adaptive equipment and DME      End of Session Equipment Utilized During Treatment: Rolling walker;Right knee immobilizer (bari 3n1, leg lifter, 2 reachers)  OT Visit Diagnosis: Unsteadiness on feet (R26.81);Dizziness and giddiness (R42);Pain Pain - Right/Left: Right Pain - part of body: Leg   Activity Tolerance Patient limited by fatigue (pt kept saying she was tired, wozzy, she needed a few minutes each time (to get herself together) each time we had her try to move, multiple rest breaks)   Patient Left in bed;with call bell/phone within reach   Nurse Communication Mobility status        Time: 1240-1430 OT Time Calculation (min): 110 min  Charges: OT General Charges $OT Visit: 1 Procedure OT Treatments $Self Care/Home Management : 53-67 mins  [place], OTR/L [phone] 03/10/2017

## 2017-03-10 NOTE — Care Management (Signed)
Case manager has been in contact with Elaine Carr at [facility] concerning patient's discharge plans. They are relocating her to a suite in her dorm, room [address]. Patient will not have 24/7 assistance in dorm. 2:40pm Case manager spoke with both PT and OT therapist concerning patient, according to their assessments patient will not be safe returning to her dorm at this time, please see their notes. CM left message for Dr. Marlou Sa concerning same. Patient will need SNF for rehab. CM has spoken with Eliezer Lofts, Social worker covering 5N.  Will continue to monitor.

## 2017-03-10 NOTE — Progress Notes (Signed)
pt up to chair after having bath.

## 2017-03-11 DIAGNOSIS — S83511A Sprain of anterior cruciate ligament of right knee, initial encounter: Secondary | ICD-10-CM | POA: Diagnosis not present

## 2017-03-11 NOTE — Progress Notes (Signed)
Occupational Therapy Treatment Patient Details Name: Elaine Carr MRN: [id number] DOB: 1996-09-13 Today's Date: 03/11/2017    History of present illness 21 y.o. female admitted for R ACL with hamstring autograft, and medial/lateral meniscus repair. PMH of morbid obesity.   OT comments  Pt improved this day but still needs significant A. For pt to go back to dorm room not ideal.   Follow Up Recommendations  SNF;Supervision/Assistance - 24 hour    Equipment Recommendations  Other (comment) (bariatric 3n1, hospital bed, wheelchair with elevating leg rests)    Recommendations for Other Services  IF DC TO DORM PT WILL NEED [facility] [place] and DME ABOVE. Pt will need PT, OT and bath aid    Precautions / Restrictions Restrictions Weight Bearing Restrictions: Yes RLE Weight Bearing: Weight bearing as tolerated Other Position/Activity Restrictions: ortho MD came in during session and changed WB status to WBAT.       Mobility Bed Mobility Overal bed mobility: Needs Assistance Bed Mobility: Sit to Supine       Sit to supine: Supervision   General bed mobility comments: increased time and VC for encouragement  Transfers Overall transfer level: Needs assistance Equipment used: Rolling walker (2 wheeled) Transfers: Sit to/from [employer] Transfers Sit to Stand: Min assist;+2 safety/equipment;Max assist Stand pivot transfers: Min assist;+2 safety/equipment;Max assist       General transfer comment: pt min- max A with transfers this day due to not performing transfer as instructed by therapist- pt attempted to turn all the way around to get from [facility] to [place]    Balance Overall balance assessment: Needs assistance Sitting-balance support: No upper extremity supported;Feet supported Sitting balance-Leahy Scale: Good     Standing balance support: Bilateral upper extremity supported;During functional activity Standing balance-Leahy Scale: Fair Standing balance comment: Heavy  reliance on RW in standing                           ADL either performed or assessed with clinical judgement   ADL   Eating/Feeding: Set up;Sitting   Grooming: Set up;Sitting   Upper Body Bathing: Minimal assistance   Lower Body Bathing: +2 for safety/equipment;+2 for physical assistance;Sitting/lateral leans;Cueing for sequencing;Cueing for safety;With adaptive equipment;Maximal assistance   Upper Body Dressing : Minimal assistance;Sitting     Lower Body Dressing Details (indicate cue type and reason): did not perform  Toilet Transfer: +2 for safety/equipment;Stand-pivot;Moderate assistance;Cueing for sequencing;Cueing for safety   Toileting- Clothing Manipulation and Hygiene: Sitting/lateral lean;Minimal assistance Toileting - Clothing Manipulation Details (indicate cue type and reason): wiping on BSC       General ADL Comments: Pt needed increased time for all tasks and increased VC for performing ADL task.  MD came in during session and made WB status WBAT.  Even with pt being able to [facility] pt will likely need ST SNF as she has no A at her dorm room and will not be able to get in her bathroom               Cognition Arousal/Alertness: Awake/alert Behavior During Therapy: [facility] for tasks assessed/performed Overall Cognitive Status: Within Functional Limits for tasks assessed                                 General Comments: pt complained of headache, then did throw up and seemed to feel better  Exercises             Pertinent Vitals/ Pain       Pain Score: 4  Pain Location: headache Pain Descriptors / Indicators: Guarding;Grimacing Pain Intervention(s): Monitored during session;Premedicated before session;Repositioned         Frequency  Min 3X/week        Progress Toward Goals  OT Goals(current goals can now be found in the care plan section)  Progress towards OT goals: Progressing toward goals     Plan Discharge plan  needs to be updated       End of Session Equipment Utilized During Treatment: Rolling walker;Other (comment) (bledsoe brace)  OT Visit Diagnosis: Unsteadiness on feet (R26.81);Dizziness and giddiness (R42);Pain Pain - Right/Left: Right Pain - part of body: Leg   Activity Tolerance Patient tolerated treatment well (pt kept saying she was tired, wozzy, she needed a few minutes each time (to get herself together) each time we had her try to move, multiple rest breaks)   Patient Left with call bell/phone within reach;in chair   Nurse Communication Mobility status    Functional Assessment Tool Used: Clinical judgement Functional Limitation: Self care Self Care Current Status (X7262): At least 60 percent but less than 80 percent impaired, limited or restricted Self Care Goal Status (M3559): At least 1 percent but less than 20 percent impaired, limited or restricted   Time: 7416-3845 OT Time Calculation (min): 71 min  Charges: OT G-codes **NOT FOR INPATIENT CLASS** Functional Assessment Tool Used: Clinical judgement Functional Limitation: Self care Self Care Current Status (X6468): At least 60 percent but less than 80 percent impaired, limited or restricted Self Care Goal Status (E3212): At least 1 percent but less than 20 percent impaired, limited or restricted OT General Charges $OT Visit: 1 Procedure OT Treatments $Self Care/Home Management : 68-82 mins  [place], [place]   Betsy Pries 03/11/2017, 11:34 AM

## 2017-03-11 NOTE — Progress Notes (Signed)
Verified with the social worker from documentation of CPAP last night on CPAP of 10.

## 2017-03-11 NOTE — Progress Notes (Signed)
Called answering service to notify doctor that patient was unable to be discharged today, due to the accepting facility being unable to receive patient.

## 2017-03-11 NOTE — Progress Notes (Signed)
qPhysical Therapy Treatment Patient Details Name: Elaine Carr MRN: [id number] DOB: June 03, 1996 Today's Date: 03/11/2017    History of Present Illness 21 y.o. female admitted for R ACL with hamstring autograft, and medial/lateral meniscus repair. PMH of morbid obesity.    PT Comments    Pt has progressed today but is still not independent to discharge back to school without 24 hr supervision for mobility and self care. MD present during treatment and changed weightbearing to WBAT through R LE. MD also requested PT to range knee <90 degrees flexion to maintain ROM. Pt requires continued skilled PT and OT to progress transfers and ambulation to be safe in her discharge environment.    Follow Up Recommendations  SNF;Supervision/Assistance - 24 hour     Equipment Recommendations  Rolling walker with 5" wheels;Wheelchair (measurements PT);Wheelchair cushion (measurements PT)    Recommendations for Other Services OT consult     Precautions / Restrictions Precautions Precautions: Knee;Fall Required Braces or Orthoses: Other Brace/Splint (Bledsoe) Knee Immobilizer - Right: On at all times Restrictions Weight Bearing Restrictions: Yes RLE Weight Bearing: Weight bearing as tolerated Other Position/Activity Restrictions: ortho MD came in during session and changed WB status to WBAT.    Mobility  Bed Mobility Overal bed mobility: Needs Assistance Bed Mobility: Sit to Supine       Sit to supine: Supervision   General bed mobility comments: increased time and VC for encouragement  Transfers Overall transfer level: Needs assistance Equipment used: Rolling walker (2 wheeled) Transfers: Sit to/from [employer] Transfers Sit to Stand: Min assist;+2 safety/equipment;Max assist Stand pivot transfers: Min assist;+2 safety/equipment;Max assist       General transfer comment: pt min- max A with transfers this day due to not performing transfer as instructed by therapist- pt  attempted to turn all the way around to get from [facility] to [place] Wheelchair Mobility Wheelchair mobility: Yes Wheelchair propulsion: Both upper extremities;Left lower extremity Wheelchair parts: Needs assistance Distance: 150 Wheelchair Assistance Details (indicate cue type and reason): Pt requires minimal verbal and tactile cues to avoid obstacles in hallway, Pt requires 5x rest breaks due to fatigue  Modified Rankin (Stroke Patients Only)       Balance Overall balance assessment: Needs assistance Sitting-balance support: No upper extremity supported;Feet supported Sitting balance-Leahy Scale: Good     Standing balance support: Bilateral upper extremity supported Standing balance-Leahy Scale: Fair Standing balance comment: Heavy reliance on RW in standing                            Cognition Arousal/Alertness: Awake/alert Behavior During Therapy: WFL for tasks assessed/performed Overall Cognitive Status: Within Functional Limits for tasks assessed                                 General Comments: pt complained of headache, then did throw up and seemed to feel better         General Comments General comments (skin integrity, edema, etc.): MD present during transfer from wc to recliner and okayed WBAT status. MD also requested PT to provide PROM <90 degrees      Pertinent Vitals/Pain Pain Assessment: Faces Pain Score: 4  Faces Pain Scale: Hurts little more Pain Location: headache Pain Descriptors / Indicators: Guarding;Grimacing Pain Intervention(s): Monitored during session;Premedicated before session;Repositioned           PT Goals (current goals can  now be found in the care plan section) Acute Rehab PT Goals PT Goal Formulation: With patient Potential to Achieve Goals: Fair Progress towards PT goals: Progressing toward goals    Frequency    Min 6X/week      PT Plan Current plan remains appropriate     Co-evaluation   Reason for Co-Treatment: For patient/therapist safety;To address functional/ADL transfers PT goals addressed during session: Mobility/safety with mobility;Proper use of DME OT goals addressed during session: ADL's and self-care;Proper use of Adaptive equipment and DME     End of Session   Activity Tolerance: Patient limited by fatigue;Patient limited by pain Patient left: in chair;with call bell/phone within reach Nurse Communication: Mobility status PT Visit Diagnosis: Other abnormalities of gait and mobility (R26.89);Muscle weakness (generalized) (M62.81);Pain;Difficulty in walking, not elsewhere classified (R26.2);Unsteadiness on feet (R26.81) Pain - Right/Left: Right Pain - part of body: Knee     Time: 3888-2800 PT Time Calculation (min) (ACUTE ONLY): 71 min  Charges:  $Therapeutic Activity: 23-37 mins                    G Codes:  Functional Assessment Tool Used: AM-PAC 6 Clicks Basic Mobility Mobility: Walking and Moving Around Current Status (L4917): At least 40 percent but less than 60 percent impaired, limited or restricted Mobility: Walking and Moving Around Goal Status [phone]): At least 1 percent but less than 20 percent impaired, limited or restricted    Benjamine Mola B. Migdalia Dk PT, DPT Acute Rehabilitation  [phone] Pager [phone]    [place] 03/11/2017, 2:13 PM

## 2017-03-11 NOTE — Care Management (Signed)
Case manager was informed by unit that patient still hasnt gotten CPN to her room. Secretary called to speak with dept that supplies DME to the units and was told that we do not have Large or ex-large CPM machines. Case Manager contacted Berton Bon with [employer] to see if we could borrow a large CPM Machine. [employer] will arrange for delivery to hospital. CM will continue to follow.

## 2017-03-11 NOTE — Clinical Social Work Note (Signed)
Clinical Social Worker facilitated patient discharge including contacting patient family and facility to confirm patient discharge plans.  Clinical information faxed to facility and family agreeable with plan.  CSW arranged ambulance transport via [place] [phone])  to [facility] . Be sure to tell PT[facility] pt needs large stretcher.  RN to call [phone] for report prior to discharge.  Clinical Social Worker will sign off for now as social work intervention is no longer needed. Please consult [place] again if new need arises.  [address] [place]

## 2017-03-11 NOTE — Progress Notes (Signed)
Pt stable Dressings ok Will let patient be wbat in bledsoe brace rle in full extension She will need short term snf for 1 - 2 weeks

## 2017-03-11 NOTE — Progress Notes (Signed)
qPhysical Therapy Treatment Patient Details Name: Elaine Carr MRN: [id number] DOB: Feb 10, 1996 Today's Date: 03/11/2017    History of Present Illness 21 y.o. female admitted for R ACL with hamstring autograft, and medial/lateral meniscus repair. PMH of morbid obesity.    PT Comments    Pt supine in bed on arrival completed therex in bed including PROM of knee per physician orders. Pt requires continued skilled PT to improve transfer and gait as well as to improve LE strength and endurance to ultimately be safe when she returns to her dorm environment.    Follow Up Recommendations  SNF;Supervision/Assistance - 24 hour     Equipment Recommendations  Rolling walker with 5" wheels;Wheelchair (measurements PT);Wheelchair cushion (measurements PT)    Recommendations for Other Services OT consult     Precautions / Restrictions Precautions Precautions: Knee;Fall Required Braces or Orthoses: Other Brace/Splint (Bledsoe) Knee Immobilizer - Right: On when out of bed or walking;On except when in CPM Restrictions Weight Bearing Restrictions: Yes RLE Weight Bearing: Weight bearing as tolerated          Cognition Arousal/Alertness: Awake/alert Behavior During Therapy: WFL for tasks assessed/performed Overall Cognitive Status: Within Functional Limits for tasks assessed                                        Exercises General Exercises - Lower Extremity Ankle Circles/Pumps: AROM;Both;10 reps;Supine Quad Sets: AROM;Both;10 reps;Supine Gluteal Sets: AROM;10 reps;Both;Supine Heel Slides: PROM;Right;Other reps (comment);Supine (3 x10 )        Pertinent Vitals/Pain Pain Assessment: Faces Faces Pain Scale: Hurts a little bit Pain Location: headache Pain Descriptors / Indicators: Guarding;Grimacing Pain Intervention(s): Monitored during session;Limited activity within patient's tolerance        VSS         PT Goals (current goals can now be found in the care plan  section) Acute Rehab PT Goals PT Goal Formulation: With patient Potential to Achieve Goals: Fair Progress towards PT goals: Progressing toward goals    Frequency    Min 6X/week      PT Plan Current plan remains appropriate       End of Session Equipment Utilized During Treatment: Gait belt Activity Tolerance: Patient limited by fatigue;Patient limited by pain Patient left: in chair;with call bell/phone within reach Nurse Communication: Mobility status PT Visit Diagnosis: Other abnormalities of gait and mobility (R26.89);Muscle weakness (generalized) (M62.81);Pain;Difficulty in walking, not elsewhere classified (R26.2);Unsteadiness on feet (R26.81) Pain - Right/Left: Right Pain - part of body: Knee     Time: 0938-1829 PT Time Calculation (min) (ACUTE ONLY): 12 min  Charges:  $Therapeutic Exercise: 8-22 mins                    G Codes:  Functional Assessment Tool Used: AM-PAC 6 Clicks Basic Mobility Mobility: Walking and Moving Around Current Status (H3716): At least 40 percent but less than 60 percent impaired, limited or restricted Mobility: Walking and Moving Around Goal Status [id number]): At least 1 percent but less than 20 percent impaired, limited or restricted    Benjamine Mola B. Migdalia Dk PT, DPT Acute Rehabilitation  [phone] Pager [phone]     [place] 03/11/2017, 5:34 PM

## 2017-03-11 NOTE — Discharge Summary (Addendum)
Physician Discharge Summary  Patient ID: Elaine Carr MRN: [id number] DOB/AGE: [birth year] 21 y.o.  Admit date: 03/08/2017 Discharge date: 03/12/2017  Admission Diagnoses:  Active Problems:   ACL tear And medial meniscal tear  Discharge Diagnoses:  Same  Surgeries: Procedure(s): RECONSTRUCTION ANTERIOR CRUCIATE LIGAMENT (ACL) WITH HAMSTRING GRAFT, MEDIAL AND LATERAL MENISCAL REPAIRS KNEE ARTHROSCOPY WITH MENISCAL REPAIR on 03/08/2017   Consultants:                                                                  Discharged Condition: Stable  Hospital Course: Elaine Carr is an 21 y.o. female who was admitted 03/08/2017 with a chief complaint of right knee pain and instability, and found to have a diagnosis of right knee anterior cruciate ligament tear with locked bucket handle tear of the entire medial meniscus.  They were brought to the operating room on 03/08/2017 and underwent the above named procedures.  Because of the patient's morbid obesity she was predictably slow to mobilize.  She was started on Xarelto for DVT prophylaxis.  Her weightbearing status was changed in order to facilitate mobilization.  Currently she is weightbearing as tolerated with her right leg in full extension in the Bledsoe brace.  She can be out of the brace when she is recumbent.  I do want her to move the knee is much as possible within a CPM machine if possible if available.  CPM use can be for 1 hour 3 times a day 0-30 increasing 5-10 per day as tolerated.  If the CPM machine is not available standard physical therapy working on leg extension exercises and range of motion exercises should be encouraged.  I do not want the knee to bend past 90 however for the first 3 weeks due to the meniscal repair.  The dressings are waterproof and that she can take a shower with the current dressings in place.  She was able to stand pivot and transfer at the time of discharge.  Her CPAP was also arranged and will be required  because of the patient's sleep apnea.  She will follow-up with me next week.  She is discharged in good condition.  CPAP should be on a setting of 10 patient was noted to have some blood in her urine on the day of discharge.  Ultrasound lateral lower chemise was negative for DVT.  Patient was taken off of Xarelto and started on 1 aspirin a day for DVT prophylaxis.  She will continue that until her follow-up appointment next week.  Antibiotics given:  Anti-infectives    Start     Dose/Rate Route Frequency Ordered Stop   03/08/17 2300  ceFAZolin (ANCEF) IVPB 2g/100 mL premix     2 g 200 mL/hr over 30 Minutes Intravenous Every 6 hours 03/08/17 2054 03/09/17 0536   03/08/17 1045  ceFAZolin (ANCEF) 2-4 GM/100ML-% IVPB    Comments:  Jones, Tomika   : cabinet override      03/08/17 1045 03/08/17 1319   03/08/17 1043  ceFAZolin (ANCEF) IVPB 2g/100 mL premix     2 g 200 mL/hr over 30 Minutes Intravenous On call to O.R. 03/08/17 1043 03/08/17 1711    .  Recent vital signs:  Vitals:   03/11/17 0457 03/11/17 1517  BP: 113/61 111/62  Pulse: 94 86  Resp: 18 20  Temp: 98.4 F (36.9 C) 98.6 F (37 C)    Recent laboratory studies:  Results for orders placed or performed during the hospital encounter of 03/08/17  hCG, serum, qualitative  Result Value Ref Range   Preg, Serum NEGATIVE NEGATIVE  CBC  Result Value Ref Range   WBC 11.6 (H) 4.0 - 10.5 K/uL   RBC 4.40 3.87 - 5.11 MIL/uL   Hemoglobin 12.2 12.0 - 15.0 g/dL   HCT 38.3 36.0 - 46.0 %   MCV 87.0 78.0 - 100.0 fL   MCH 27.7 26.0 - 34.0 pg   MCHC 31.9 30.0 - 36.0 g/dL   RDW 16.8 (H) 11.5 - 15.5 %   Platelets 451 (H) 150 - 400 K/uL  Basic metabolic panel  Result Value Ref Range   Sodium 138 135 - 145 mmol/L   Potassium 4.2 3.5 - 5.1 mmol/L   Chloride 104 101 - 111 mmol/L   CO2 25 22 - 32 mmol/L   Glucose, Bld 94 65 - 99 mg/dL   BUN 9 6 - 20 mg/dL   Creatinine, Ser 0.69 0.44 - 1.00 mg/dL   Calcium 9.4 8.9 - 10.3 mg/dL   GFR calc  non Af Amer >60 >60 mL/min   GFR calc Af Amer >60 >60 mL/min   Anion gap 9 5 - 15  Hemoglobin A1c  Result Value Ref Range   Hgb A1c MFr Bld 6.1 (H) 4.8 - 5.6 %   Mean Plasma Glucose 128 mg/dL  Glucose, capillary  Result Value Ref Range   Glucose-Capillary 92 65 - 99 mg/dL   Comment 1 Notify RN    Comment 2 Document in Chart     Discharge Medications:   Allergies as of 03/11/2017      Reactions   No Known Allergies       Medication List    STOP taking these medications   ibuprofen 200 MG tablet Commonly known as:  ADVIL,MOTRIN   meloxicam 7.5 MG tablet Commonly known as:  MOBIC     TAKE these medications   ferrous sulfate 325 (65 FE) MG EC tablet Take 325 mg by mouth 2 (two) times daily.   metFORMIN 750 MG 24 hr tablet Commonly known as:  GLUCOPHAGE-XR Take 750 mg by mouth 2 (two) times daily.   methocarbamol 500 MG tablet Commonly known as:  ROBAXIN Take 1 tablet (500 mg total) by mouth every 6 (six) hours as needed for muscle spasms.   multivitamin with minerals Tabs tablet Take 1 tablet by mouth daily.   oxyCODONE 5 MG immediate release tablet Commonly known as:  Oxy IR/ROXICODONE Do not take after 9 p.m. at night   rivaroxaban 10 MG Tabs tablet Commonly known as:  XARELTO Take 1 tablet (10 mg total) by mouth daily.   Vitamin D 2000 units Caps Take 2,000 Units by mouth daily.            Durable Medical Equipment        Start     Ordered   03/10/17 1150  For home use only DME continuous positive airway pressure (CPAP)  Once    Comments:  Humidity level is 4.  headmask is pillow. REM time is auto.  Question Answer Comment  Patient has OSA or probable OSA Yes   Settings 5-10   Signs and symptoms of probable OSA  (select all that apply) Moring headaches   CPAP supplies needed Mask,  headgear, cushions, filters, heated tubing and water chamber      03/10/17 1156   03/10/17 1141  For home use only DME continuous positive airway pressure (CPAP)   Once    Question Answer Comment  Patient has OSA or probable OSA Yes   Settings 5-10   CPAP supplies needed Mask, headgear, cushions, filters, heated tubing and water chamber      03/10/17 1156   03/09/17 1439  For home use only DME lightweight manual wheelchair with seat cushion  Once    Comments:  Patient suffers from repair of right meniscus tear  which impairs their ability to perform daily activities like ambulating in the home.  A cane will not resolve  issue with performing activities of daily living. A wheelchair will allow patient to safely perform daily activities. Patient is not able to propel themselves in the home using a standard weight wheelchair due to generalized weakness. Patient can self propel in the lightweight wheelchair.  Accessories: elevating leg rests (ELRs), wheel locks, extensions and anti-tippers.  Patient needs bariatric wheelchair with elevating leg rests   03/09/17 1440   03/09/17 1437  For home use only DME Walker  Once    Comments:  Needs bariatric walker  Weighs 427lbs  Question:  Patient needs a walker to treat with the following condition  Answer:  S/P right knee arthroscopy   03/09/17 1440   03/09/17 1436  For home use only DME 3 n 1  Once    Comments:  Needs bariatric   03/09/17 1440      Diagnostic Studies: No results found.  Disposition: 01-Home or Self Care  Discharge Instructions    Call MD / Call 911    Complete by:  As directed    If you experience chest pain or shortness of breath, CALL 911 and be transported to the hospital emergency room.  If you develope a fever above 101 F, pus (white drainage) or increased drainage or redness at the wound, or calf pain, call your surgeon's office.   Call MD / Call 911    Complete by:  As directed    If you experience chest pain or shortness of breath, CALL 911 and be transported to the hospital emergency room.  If you develope a fever above 101 F, pus (white drainage) or increased drainage or  redness at the wound, or calf pain, call your surgeon's office.   Constipation Prevention    Complete by:  As directed    Drink plenty of fluids.  Prune juice may be helpful.  You may use a stool softener, such as Colace (over the counter) 100 mg twice a day.  Use MiraLax (over the counter) for constipation as needed.   Constipation Prevention    Complete by:  As directed    Drink plenty of fluids.  Prune juice may be helpful.  You may use a stool softener, such as Colace (over the counter) 100 mg twice a day.  Use MiraLax (over the counter) for constipation as needed.   Diet - low sodium heart healthy    Complete by:  As directed    Diet - low sodium heart healthy    Complete by:  As directed    Discharge instructions    Complete by:  As directed    Okay to be touchdown weightbearing on that right leg in the knee immobilizer CPM machine 1 hour 3 times a day 0-30 to begin with increase 5 daily.  Do  not go beyond 90 Okay to shower with waterproof dressings on the knee Do not take pain medicine after 9 p.m. at night Do  take blood thinner medication daily   Increase activity slowly as tolerated    Complete by:  As directed    Increase activity slowly as tolerated    Complete by:  As directed       Contact information for after-discharge care    Destination    [facility] .   Specialty:  [place] information: [address] [place] [phone]               Signed: Anderson Malta 03/11/2017, 4:52 PM

## 2017-03-12 ENCOUNTER — Observation Stay (HOSPITAL_BASED_OUTPATIENT_CLINIC_OR_DEPARTMENT_OTHER): Payer: Managed Care, Other (non HMO)

## 2017-03-12 DIAGNOSIS — M79609 Pain in unspecified limb: Secondary | ICD-10-CM

## 2017-03-12 DIAGNOSIS — S83511A Sprain of anterior cruciate ligament of right knee, initial encounter: Secondary | ICD-10-CM | POA: Diagnosis not present

## 2017-03-12 LAB — URINALYSIS, ROUTINE W REFLEX MICROSCOPIC
BILIRUBIN URINE: NEGATIVE
Bacteria, UA: NONE SEEN
Glucose, UA: NEGATIVE mg/dL
Ketones, ur: 5 mg/dL — AB
LEUKOCYTES UA: NEGATIVE
NITRITE: NEGATIVE
PH: 5 (ref 5.0–8.0)
Protein, ur: 100 mg/dL — AB
SPECIFIC GRAVITY, URINE: 1.023 (ref 1.005–1.030)
WBC, UA: NONE SEEN WBC/hpf (ref 0–5)

## 2017-03-12 MED ORDER — ASPIRIN 325 MG PO TABS: 325.0000 mg | ORAL_TABLET | Freq: Every day | ORAL | 0 refills | Status: DC

## 2017-03-12 MED ORDER — ASPIRIN 325 MG PO TABS
325.0000 mg | ORAL_TABLET | Freq: Every day | ORAL | Status: DC
  Filled 2017-03-12: qty 1

## 2017-03-12 MED ORDER — MAGNESIUM CITRATE PO SOLN
1.0000 | Freq: Once | ORAL | Status: AC
Start: 2017-03-12 — End: 2017-03-12
  Administered 2017-03-12: 1 via ORAL
  Filled 2017-03-12: qty 296

## 2017-03-12 NOTE — Progress Notes (Signed)
Nurse received a call from social worker to inform that Patient is unable to discharge since  the facility is unable to provide CPAP machine at the moment. Will continue to monitor.

## 2017-03-12 NOTE — Progress Notes (Signed)
qPhysical Therapy Treatment Patient Details Name: Elaine Carr MRN: [id number] DOB: 1996-07-17 Today's Date: 03/12/2017    History of Present Illness 21 y.o. female admitted for R ACL with hamstring autograft, and medial/lateral meniscus repair. PMH of morbid obesity.    PT Comments    Pt needs strong encouragement to progress gait and mobility.  She was able to ambulate into the hallway and preform LE HEP.  She reports lightheadedness and nausea today.  She remains appropriate for SNF level rehab at discharge.    Follow Up Recommendations  SNF;Supervision/Assistance - 24 hour     Equipment Recommendations  Rolling walker with 5" wheels;Wheelchair (measurements PT);Wheelchair cushion (measurements PT) (all bariatric equipment needed)    Recommendations for Other Services   NA     Precautions / Restrictions Precautions Precautions: Knee;Fall Required Braces or Orthoses: Other Brace/Splint (bledsoe) Knee Immobilizer - Right: On when out of bed or walking;On except when in CPM Restrictions Weight Bearing Restrictions: Yes RLE Weight Bearing: Weight bearing as tolerated Other Position/Activity Restrictions: see previous note dated 3/30 re: MD changing WB status    Mobility  Bed Mobility Overal bed mobility: Needs Assistance Bed Mobility: Supine to Sit     Supine to sit: Supervision;HOB elevated Sit to supine: Supervision   General bed mobility comments: Supervision for safety, HOB elevated, pt using railing for leverage at times and using bil hands to progress right leg over EOB.   Transfers Overall transfer level: Needs assistance Equipment used: Rolling walker (2 wheeled) Transfers: Sit to/from Stand Sit to Stand: +2 safety/equipment;Min assist         General transfer comment: Two person min assist (for safety) to stand multiple times from bed and recliner.  Pt needed support at trunk for balance during transitions and to stabilize RW.     Ambulation/Gait Ambulation/Gait assistance: +2 safety/equipment;Min assist (chair to follow) Ambulation Distance (Feet): 60 Feet (3 seated rest breaks) Assistive device: Rolling walker (2 wheeled) Gait Pattern/deviations: Step-to pattern;Shuffle;Trunk flexed Gait velocity: decreased Gait velocity interpretation: Below normal speed for age/gender General Gait Details: RW adjusted up for better fit, min assist at trunk for balance, verbal cues for safest LE sequencing, verbal cues for upright posture.           Balance Overall balance assessment: Needs assistance Sitting-balance support: Feet supported;No upper extremity supported Sitting balance-Leahy Scale: Good     Standing balance support: Bilateral upper extremity supported Standing balance-Leahy Scale: Fair Standing balance comment: Able to statically stand briefly without UE support during ADL tasks. Reliant on B UE support from RW for dynamic tasks.                            Cognition Arousal/Alertness: Awake/alert Behavior During Therapy: WFL for tasks assessed/performed Overall Cognitive Status: Within Functional Limits for tasks assessed                                        Exercises General Exercises - Lower Extremity Ankle Circles/Pumps: AROM;Both;10 reps;Supine Quad Sets: AROM;Both;10 reps Heel Slides: PROM;Right;10 reps (kept below 90 degrees)        Pertinent Vitals/Pain Pain Assessment: 0-10 Pain Score: 7  Faces Pain Scale: Hurts even more Pain Location: right knee Pain Descriptors / Indicators: Grimacing;Guarding Pain Intervention(s): Limited activity within patient's tolerance;Monitored during session;Repositioned  PT Goals (current goals can now be found in the care plan section) Acute Rehab PT Goals Patient Stated Goal: return to classes at [facility] Progress towards PT goals: Progressing toward goals    Frequency    Min 6X/week       PT Plan Current plan remains appropriate    Co-evaluation PT/OT/SLP Co-Evaluation/Treatment: Yes Reason for Co-Treatment: For patient/therapist safety PT goals addressed during session: Mobility/safety with mobility;Balance;Proper use of DME;Strengthening/ROM OT goals addressed during session: ADL's and self-care;Proper use of Adaptive equipment and DME     End of Session Equipment Utilized During Treatment: Right knee immobilizer (R knee bledsoe) Activity Tolerance: Patient limited by pain;Patient limited by fatigue;Other (comment) (by nausea) Patient left: in chair;with call bell/phone within reach   PT Visit Diagnosis: Other abnormalities of gait and mobility (R26.89);Muscle weakness (generalized) (M62.81);Pain;Difficulty in walking, not elsewhere classified (R26.2);Unsteadiness on feet (R26.81) Pain - Right/Left: Right Pain - part of body: Knee     Time: 1107-1208 PT Time Calculation (min) (ACUTE ONLY): 61 min  Charges:  $Gait Training: 8-22 mins $Therapeutic Exercise: 8-22 mins                    G Codes:  Mobility: Walking and Moving Around Goal Status [id number]): At least 1 percent but less than 20 percent impaired, limited or restricted Mobility: Walking and Moving Around Discharge Status [phone]): At least 40 percent but less than 60 percent impaired, limited or restricted       Daimen Shovlin B. Mellissa Conley, PT, DPT [phone]   03/12/2017, 12:40 PM

## 2017-03-12 NOTE — Progress Notes (Signed)
Patient states that she will self-administer CPAP when ready for bed.  Patient is familiar with equipment and procedure.  Humidifier filled with sterile water.  2L O2 bleed in.  10 cmH20.

## 2017-03-12 NOTE — Progress Notes (Signed)
Ultrasound negative for DVT bilateral lower chemise.  Patient likely has cystitis.  We'll stop Xarelto place her on 1 aspirin a day.  Continue with weightbearing as tolerated.  Ready for discharge from the hospital immediately.

## 2017-03-12 NOTE — Clinical Social Work Note (Addendum)
Pt was supposed to d/c yesterday, but was unable due to facility not having CPAP machine. CSW spoke with Liliane Channel and Tammy of [facility]- confirmed that CPAP and CMP is now at facility. Pt doppler revealed no blood clots. Per MD Marlou Sa, pt is to stop taking Xaralto and start aspirin. MD to change d/c summary to reflect this information. Tammy made aware of reason for delay. [facility] Zack aware also and asked that RN call him when pt ready for d/c. Zack's number given to [employer]. RN to call [phone] for report prior to discharge.Transportation form completed and on chart. [facility] Zack available for final transport.   Oretha Ellis, [place], [place] Work [phone]

## 2017-03-12 NOTE — Progress Notes (Signed)
No new issues Awaiting placement CPAP should be set on 10 Continue physical therapy for weightbearing as tolerated on the right leg in the Bledsoe brace as well as knee range of motion when the patient is supine Do not exceed 90 of flexion

## 2017-03-12 NOTE — Progress Notes (Signed)
Blood in urine On xarelto Plan [place] to r/o dvt - she is at high risk If [place] neg then dc xarelto and use asa insteadi

## 2017-03-12 NOTE — Progress Notes (Signed)
Occupational Therapy Treatment Patient Details Name: Elaine Carr MRN: [id number] DOB: Nov 20, 1996 Today's Date: 03/12/2017    History of present illness 21 y.o. female admitted for R ACL with hamstring autograft, and medial/lateral meniscus repair. PMH of morbid obesity.   OT comments  Pt progressing toward OT goals. She was able to complete toilet transfers with min assist +2 for safety and stand at the sink to complete grooming tasks with min guard assist. She continues to demonstrate significantly decreased activity tolerance for ADL but is able to persist with tasks follow seated rest breaks. Continue to feel that pt is unsafe to return to dorm on her own at this time and would benefit from short-term SNF placement for continued rehabilitation.  Follow Up Recommendations  SNF;Supervision/Assistance - 24 hour    Equipment Recommendations   (TBD - possible bariatric BSC)    Recommendations for Other Services      Precautions / Restrictions Precautions Precautions: Knee;Fall Required Braces or Orthoses: Other Brace/Splint (bledsoe) Knee Immobilizer - Right: On when out of bed or walking;On except when in CPM Restrictions Weight Bearing Restrictions: Yes RLE Weight Bearing: Weight bearing as tolerated Other Position/Activity Restrictions: see previous note dated 3/30 re: MD changing WB status       Mobility Bed Mobility Overal bed mobility: Needs Assistance Bed Mobility: Supine to Sit Rolling: Supervision     Sit to supine: Supervision   General bed mobility comments: increased time and VC for encouragement  Transfers Overall transfer level: Needs assistance Equipment used: Rolling walker (2 wheeled) Transfers: Sit to/from Stand Sit to Stand: Min assist;+2 safety/equipment         General transfer comment: Min assist to steady.    Balance Overall balance assessment: Needs assistance Sitting-balance support: No upper extremity supported;Feet supported Sitting  balance-Leahy Scale: Good     Standing balance support: Bilateral upper extremity supported;During functional activity Standing balance-Leahy Scale: Fair Standing balance comment: Able to statically stand briefly without UE support during ADL tasks. Reliant on B UE support from RW for dynamic tasks.                           ADL either performed or assessed with clinical judgement   ADL Overall ADL's : Needs assistance/impaired     Grooming: Standing;Min guard Grooming Details (indicate cue type and reason): Leaning elbows onto sink and had chair behind as pt fatigues easily.         Upper Body Dressing : Sitting;Min guard       Toilet Transfer: +2 for safety/equipment;Minimal assistance;Ambulation;RW           Functional mobility during ADLs: Min guard;+2 for safety/equipment;Rolling walker;Minimal assistance General ADL Comments: Significantly decreased activity tolerance for ADL requiring therapeutic rest breaks frequently.      Vision   Vision Assessment?: No apparent visual deficits   Perception     Praxis      Cognition Arousal/Alertness: Awake/alert Behavior During Therapy: WFL for tasks assessed/performed Overall Cognitive Status: Within Functional Limits for tasks assessed                                          Exercises     Shoulder Instructions       General Comments      Pertinent Vitals/ Pain       Pain Assessment: 0-10 Pain  Score: 7  Faces Pain Scale: Hurts even more Pain Location: right knee Pain Descriptors / Indicators: Grimacing;Guarding Pain Intervention(s): Limited activity within patient's tolerance;Monitored during session;Repositioned  Home Living                                          Prior Functioning/Environment              Frequency  Min 3X/week        Progress Toward Goals  OT Goals(current goals can now be found in the care plan section)  Progress towards  OT goals: Progressing toward goals  Acute Rehab OT Goals Patient Stated Goal: return to classes at [facility] OT Goal Formulation: With patient Time For Goal Achievement: 03/23/17 Potential to Achieve Goals: Good ADL Goals Pt Will Perform Grooming: with set-up;standing Pt Will Perform Upper Body Bathing: with set-up;sitting Pt Will Perform Lower Body Bathing: with min assist;with adaptive equipment;sit to/from stand;sitting/lateral leans Pt Will Perform Upper Body Dressing: with set-up;sitting Pt Will Perform Lower Body Dressing: with min assist;sit to/from stand;with adaptive equipment;sitting/lateral leans Pt Will Transfer to Toilet: with min guard assist;bedside commode Pt Will Perform Toileting - Clothing Manipulation and hygiene: with min guard assist;sit to/from stand;sitting/lateral leans;with adaptive equipment  Plan Discharge plan needs to be updated    Co-evaluation    PT/OT/SLP Co-Evaluation/Treatment: Yes Reason for Co-Treatment: For patient/therapist safety PT goals addressed during session: Mobility/safety with mobility;Balance;Proper use of DME;Strengthening/ROM OT goals addressed during session: ADL's and self-care;Proper use of Adaptive equipment and DME      End of Session Equipment Utilized During Treatment: Rolling walker (R Bledsoe brace)  OT Visit Diagnosis: Unsteadiness on feet (R26.81);Dizziness and giddiness (R42);Pain Pain - Right/Left: Right Pain - part of body: Leg   Activity Tolerance Patient tolerated treatment well   Patient Left with call bell/phone within reach;in chair   Nurse Communication Mobility status    Functional Assessment Tool Used: AM-PAC 6 Clicks Daily Activity Functional Limitation: Self care Self Care Current Status (T0354): At least 40 percent but less than 60 percent impaired, limited or restricted Self Care Goal Status (S5681): At least 1 percent but less than 20 percent impaired, limited or restricted   Time:  1107-1159 OT Time Calculation (min): 52 min  Charges: OT G-codes **NOT FOR INPATIENT CLASS** Functional Assessment Tool Used: AM-PAC 6 Clicks Daily Activity Functional Limitation: Self care Self Care Current Status (E7517): At least 40 percent but less than 60 percent impaired, limited or restricted Self Care Goal Status (G0174): At least 1 percent but less than 20 percent impaired, limited or restricted OT General Charges $OT Visit: 1 Procedure OT Treatments $Self Care/Home Management : 8-22 mins  Norman Herrlich, MS OTR/L  Pager: [place] 03/12/2017, 12:36 PM

## 2017-03-12 NOTE — Progress Notes (Addendum)
VASCULAR LAB PRELIMINARY  PRELIMINARY  PRELIMINARY  PRELIMINARY  Bilateral lower extremity venous duplex completed.    Preliminary report:  There is no obvious evidence of DVT or SVT noted in the visualized veins of the bilateral lower extremities.  Ben Habermann, RVT 03/12/2017, 4:20 PM

## 2017-03-15 ENCOUNTER — Non-Acute Institutional Stay (SKILLED_NURSING_FACILITY): Payer: Managed Care, Other (non HMO) | Admitting: Adult Health

## 2017-03-15 ENCOUNTER — Encounter: Payer: Self-pay | Admitting: Adult Health

## 2017-03-15 DIAGNOSIS — S83511D Sprain of anterior cruciate ligament of right knee, subsequent encounter: Secondary | ICD-10-CM | POA: Diagnosis not present

## 2017-03-15 NOTE — Progress Notes (Signed)
Location:   [place] Room Number: 120 B Place of Service:  SNF (31)    CODE STATUS: Full Code  Allergies  Allergen Reactions  . No Known Allergies     Chief Complaint  Patient presents with  . Discharge Note    Discharge    HPI:  She is being discharged to home without home health. She will not need any dme; she has necessary equipment. She will need her prescriptions to be written and will need to follow up with her medical provider.    Past Medical History:  Diagnosis Date  . ACL (anterior cruciate ligament) tear     and lateral, medial meniscal tear  . Anemia   . 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 Pre-diabetes   . Sleep apnea    wears CPAP    Past Surgical History:  Procedure Laterality Date  . ANTERIOR CRUCIATE LIGAMENT REPAIR Right 03/08/2017   Procedure: RECONSTRUCTION ANTERIOR CRUCIATE LIGAMENT (ACL) WITH HAMSTRING GRAFT, MEDIAL AND LATERAL MENISCAL REPAIRS;  Surgeon: Meredith Pel, MD;  Location: [place];  Service: Orthopedics;  Laterality: Right;  . KNEE ARTHROSCOPY WITH MENISCAL REPAIR Right 03/08/2017   Procedure: KNEE ARTHROSCOPY WITH MENISCAL REPAIR;  Surgeon: Meredith Pel, MD;  Location: [place];  Service: Orthopedics;  Laterality: Right;  . TUMOR REMOVAL     stomach  . WISDOM TOOTH EXTRACTION      Social History   Social History  . Marital status: Single    Spouse name: N/A  . Number of children: N/A  . Years of education: N/A   Occupational History  . Not on file.   Social History Main Topics  . Smoking status: Never Smoker  . Smokeless tobacco: Never Used  . Alcohol use No  . Drug use: No  . Sexual activity: Not on file   Other Topics Concern  . Not on file   Social History Narrative  . No narrative on file   Family History  Problem Relation Age of Onset  . Cancer Other     VITAL SIGNS BP 116/63   Pulse 88   Temp 98.3 F (36.8 C)   Resp 20   Ht 5' (1.524 m)   Wt 300 lb (136.1 kg)   SpO2 96%    BMI 58.59 kg/m   Patient's Medications  New Prescriptions   No medications on file  Previous Medications   CHOLECALCIFEROL (VITAMIN D) 2000 UNITS CAPS    Take 2,000 Units by mouth daily.   FERROUS SULFATE 325 (65 FE) MG EC TABLET    Take 325 mg by mouth 2 (two) times daily.   METFORMIN (GLUCOPHAGE-XR) 750 MG 24 HR TABLET    Take 750 mg by mouth 2 (two) times daily.   METHOCARBAMOL (ROBAXIN) 500 MG TABLET    Take 1 tablet (500 mg total) by mouth every 6 (six) hours as needed for muscle spasms.   MULTIPLE VITAMIN (MULTIVITAMIN WITH MINERALS) TABS TABLET    Take 1 tablet by mouth daily.   OXYCODONE HCL, ABUSE DETER, (OXAYDO) 5 MG TABA    Take 1-2 tablets by mouth every 4 (four) hours as needed.   RIVAROXABAN (XARELTO) 10 MG TABS TABLET    Take 1 tablet (10 mg total) by mouth daily.  Modified Medications   No medications on file  Discontinued Medications   ASPIRIN 325 MG TABLET    Take 1 tablet (325 mg total) by mouth daily.   OXYCODONE (  OXY IR/ROXICODONE) 5 MG IMMEDIATE RELEASE TABLET    Do not take after 9 p.m. at night     SIGNIFICANT DIAGNOSTIC EXAMS   Review of Systems  Constitutional: Negative for malaise/fatigue.  Respiratory: Negative for cough and shortness of breath.   Cardiovascular: Negative for chest pain, palpitations and leg swelling.  Gastrointestinal: Negative for abdominal pain, constipation and heartburn.  Musculoskeletal: Negative for back pain, joint pain and myalgias.  Skin: Negative.   Neurological: Negative for dizziness.  Psychiatric/Behavioral: The patient is not nervous/anxious.     Physical Exam  Constitutional: No distress.  Obese   Eyes: Conjunctivae are normal.  Neck: Neck supple. No JVD present. No thyromegaly present.  Cardiovascular: Normal rate, regular rhythm and intact distal pulses.   Respiratory: Effort normal and breath sounds normal. No respiratory distress. She has no wheezes.  GI: Soft. Bowel sounds are normal. She exhibits no  distension. There is no tenderness.  Musculoskeletal: She exhibits no edema.  Able to move all extremities  Has right knee in immobilizer   Lymphadenopathy:    She has no cervical adenopathy.  Neurological: She is alert.  Skin: Skin is warm and dry. She is not diaphoretic.  Psychiatric: She has a normal mood and affect.     ASSESSMENT/ PLAN:  Patient is being discharged with the following home health services:  None required  Patient is being discharged with the following durable medical equipment:  None required   Patient has been advised to f/u with their PCP in 1-2 weeks to bring them up to date on their rehab stay.  Social services at facility was responsible for arranging this appointment.  Pt was provided with a 30 day supply of prescriptions for medications and refills must be obtained from their PCP.  For controlled substances, a more limited supply may be provided adequate until PCP appointment only. #20 oxycodone 5 mg tabs    Time spent with patient 40   minutes >50% time spent counseling; reviewing medical record; tests; labs; and developing future plan of care    Ok Edwards NP [facility]  Contact [phone] Monday through Friday 8am- 5pm  After hours call [phone]

## 2017-03-17 ENCOUNTER — Ambulatory Visit (INDEPENDENT_AMBULATORY_CARE_PROVIDER_SITE_OTHER): Payer: Self-pay | Admitting: Orthopedic Surgery

## 2017-03-17 ENCOUNTER — Encounter (INDEPENDENT_AMBULATORY_CARE_PROVIDER_SITE_OTHER): Payer: Self-pay | Admitting: Orthopedic Surgery

## 2017-03-17 DIAGNOSIS — S83511D Sprain of anterior cruciate ligament of right knee, subsequent encounter: Secondary | ICD-10-CM

## 2017-03-18 NOTE — Progress Notes (Signed)
   Post-Op Visit Note   Patient: Elaine Carr           Date of Birth: 06-19-1996           MRN: [id number] Visit Date: 03/17/2017 PCP: PROVIDER NOT IN SYSTEM   Assessment & Plan:  Chief Complaint:  Chief Complaint  Patient presents with  . Right Knee - Routine Post Op   Visit Diagnoses:  1. Rupture of anterior cruciate ligament of right knee, subsequent encounter     Plan: Wound is a 21 year old patient 1 week out right knee anterior cruciate ligament reconstruction with very large medial meniscal repair.  She's been doing CPM about 3 hours a day.  She is back in her dormitory.  She is on aspirin for DVT prophylaxis.  She's taking oxycodone for pain.  On exam she's lacking about 10 of extension but has about 70 of flexion graft is stable she has a brace which she wears.  She can't quite do a straight leg raise yet.  Plan is for her to continue with physical therapy sutures discontinued today.  I'll see her back in 3 weeks for clinical recheck.  No flexion past 90 on the CPM machine.  Follow-Up Instructions: Return in about 3 weeks (around 04/07/2017).   Orders:  No orders of the defined types were placed in this encounter.  No orders of the defined types were placed in this encounter.   Imaging: No results found.  PMFS History: Patient Active Problem List   Diagnosis Date Noted  . ACL tear 03/08/2017   Past Medical History:  Diagnosis Date  . ACL (anterior cruciate ligament) tear     and lateral, medial meniscal tear  . Anemia   . 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 Pre-diabetes   . Sleep apnea    wears CPAP    Family History  Problem Relation Age of Onset  . Cancer Other     Past Surgical History:  Procedure Laterality Date  . ANTERIOR CRUCIATE LIGAMENT REPAIR Right 03/08/2017   Procedure: RECONSTRUCTION ANTERIOR CRUCIATE LIGAMENT (ACL) WITH HAMSTRING GRAFT, MEDIAL AND LATERAL MENISCAL REPAIRS;  Surgeon: Meredith Pel, MD;  Location: [place];  Service:  Orthopedics;  Laterality: Right;  . KNEE ARTHROSCOPY WITH MENISCAL REPAIR Right 03/08/2017   Procedure: KNEE ARTHROSCOPY WITH MENISCAL REPAIR;  Surgeon: Meredith Pel, MD;  Location: [place];  Service: Orthopedics;  Laterality: Right;  . TUMOR REMOVAL     stomach  . WISDOM TOOTH EXTRACTION     Social History   Occupational History  . Not on file.   Social History Main Topics  . Smoking status: Never Smoker  . Smokeless tobacco: Never Used  . Alcohol use No  . Drug use: No  . Sexual activity: Not on file

## 2017-04-07 ENCOUNTER — Ambulatory Visit (INDEPENDENT_AMBULATORY_CARE_PROVIDER_SITE_OTHER): Payer: Self-pay | Admitting: Orthopedic Surgery

## 2017-04-07 ENCOUNTER — Encounter (INDEPENDENT_AMBULATORY_CARE_PROVIDER_SITE_OTHER): Payer: Self-pay | Admitting: Orthopedic Surgery

## 2017-04-07 DIAGNOSIS — S83511D Sprain of anterior cruciate ligament of right knee, subsequent encounter: Secondary | ICD-10-CM

## 2017-04-07 MED ORDER — OXYCODONE HCL 5 MG PO TABS: 5.0000 mg | ORAL_TABLET | Freq: Two times a day (BID) | ORAL | 0 refills | Status: DC | PRN

## 2017-04-10 NOTE — Progress Notes (Signed)
   Post-Op Visit Note   Patient: Elaine Carr           Date of Birth: [birth year]           MRN: [id number] Visit Date: 04/07/2017 PCP: PROVIDER NOT IN SYSTEM   Assessment & Plan:  Chief Complaint:  Chief Complaint  Patient presents with  . Right Knee - Routine Post Op   Visit Diagnoses:  1. Rupture of anterior cruciate ligament of right knee, subsequent encounter     Plan: Patient is a 21 year old female who underwent anterior cruciate ligament reconstruction on the right along with meniscal repair.  She is back in the door.  She is doing exercises.  She's leaning back to [place] at the end of this term.  That will be in several weeks.  On exam her knee is stable.  All incisions have healed well.  There is no knee effusion.  She's achieved full extension and about 85 of flexion.  Plan at this time is to avoid loaded flexion exercises with weightbearing for at least 3 more months until I see her back.  I think is fine for her to bend her knee past 90.  She can get the left one back to about 110.  But in terms of squats beyond 90 that I want her to avoid.  All this is written out on a prescription pad and her op note and PT instructions are provided all see her back in 4 months when she comes back from her summer break  Follow-Up Instructions: Return in about 4 months (around 08/07/2017).   Orders:  No orders of the defined types were placed in this encounter.  Meds ordered this encounter  Medications  . oxyCODONE (OXY IR/ROXICODONE) 5 MG immediate release tablet    Sig: Take 1 tablet (5 mg total) by mouth every 12 (twelve) hours as needed for severe pain.    Dispense:  60 tablet    Refill:  0    Imaging: No results found.  PMFS History: Patient Active Problem List   Diagnosis Date Noted  . ACL tear 03/08/2017   Past Medical History:  Diagnosis Date  . ACL (anterior cruciate ligament) tear     and lateral, medial meniscal tear  . Anemia   . 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 Pre-diabetes   . Sleep apnea    wears CPAP    Family History  Problem Relation Age of Onset  . Cancer Other     Past Surgical History:  Procedure Laterality Date  . ANTERIOR CRUCIATE LIGAMENT REPAIR Right 03/08/2017   Procedure: RECONSTRUCTION ANTERIOR CRUCIATE LIGAMENT (ACL) WITH HAMSTRING GRAFT, MEDIAL AND LATERAL MENISCAL REPAIRS;  Surgeon: Meredith Pel, MD;  Location: [place];  Service: Orthopedics;  Laterality: Right;  . KNEE ARTHROSCOPY WITH MENISCAL REPAIR Right 03/08/2017   Procedure: KNEE ARTHROSCOPY WITH MENISCAL REPAIR;  Surgeon: Meredith Pel, MD;  Location: [place];  Service: Orthopedics;  Laterality: Right;  . TUMOR REMOVAL     stomach  . WISDOM TOOTH EXTRACTION     Social History   Occupational History  . Not on file.   Social History Main Topics  . Smoking status: Never Smoker  . Smokeless tobacco: Never Used  . Alcohol use No  . Drug use: No  . Sexual activity: Not on file

## 2017-05-13 NOTE — Addendum Note (Signed)
Addendum  created 05/13/17 1128 by [name], MD   Sign clinical note

## 2017-05-13 NOTE — Anesthesia Postprocedure Evaluation (Signed)
Anesthesia Post Note  Patient: Elaine Carr  Procedure(s) Performed: Procedure(s) (LRB): RECONSTRUCTION ANTERIOR CRUCIATE LIGAMENT (ACL) WITH HAMSTRING GRAFT, MEDIAL AND LATERAL MENISCAL REPAIRS (Right) KNEE ARTHROSCOPY WITH MENISCAL REPAIR (Right)     Anesthesia Post Evaluation  Last Vitals:  Vitals:   03/12/17 0434 03/12/17 1430  BP: (!) 110/50 123/60  Pulse: 95 88  Resp: 17 17  Temp: 37 C 36.7 C    Last Pain:  Vitals:   03/12/17 1430  TempSrc: Oral  PainSc:                  [name]

## 2017-10-10 ENCOUNTER — Ambulatory Visit (HOSPITAL_COMMUNITY)
Admission: EM | Admit: 2017-10-10 | Discharge: 2017-10-10 | Disposition: A | Discharge status: Home / Self Care | Payer: Managed Care, Other (non HMO) | Attending: Emergency Medicine | Admitting: Emergency Medicine

## 2017-10-10 ENCOUNTER — Encounter (HOSPITAL_COMMUNITY): Payer: Self-pay | Admitting: Emergency Medicine

## 2017-10-10 DIAGNOSIS — T148XXA Other injury of unspecified body region, initial encounter: Secondary | ICD-10-CM

## 2017-10-10 DIAGNOSIS — S86911A Strain of unspecified muscle(s) and tendon(s) at lower leg level, right leg, initial encounter: Secondary | ICD-10-CM | POA: Diagnosis not present

## 2017-10-10 DIAGNOSIS — M79604 Pain in right leg: Secondary | ICD-10-CM | POA: Diagnosis not present

## 2017-10-10 DIAGNOSIS — W19XXXA Unspecified fall, initial encounter: Secondary | ICD-10-CM | POA: Diagnosis not present

## 2017-10-10 NOTE — Discharge Instructions (Signed)
Ice to the knee off and on. Continue to perform the extension and flexion rehabilitation exercises. Heat to the muscles of the thigh. Perform stretches as demonstrated, slowly. If not getting better or if getting worse in the next week call your orthopedist.

## 2017-10-10 NOTE — ED Provider Notes (Signed)
[facility]    CSN: [id number] Arrival date & time: 10/10/17  1038     History   Chief Complaint Chief Complaint  Patient presents with  . Leg Pain    HPI Elaine Carr is a 21 y.o. female.   21 year old morbidly obese female states that she slipped on some water 5 days ago and she twisted her right knee and felt some discomfort in the right thigh. This was very minimal and she pretty much continued with her usual ADLs home ambulation etc. It was not until 2 days later she started to develop pain in the right anterior thigh musculature and soreness in the right knee. She states she has chronic soreness in the right knee status post knee surgery earlier this year. She has been ambulatory. Now with somewhat of a limp. The most pain she is concerned about is in the musculature of the right thigh.      Past Medical History:  Diagnosis Date  . ACL (anterior cruciate ligament) tear     and lateral, medial meniscal tear  . Anemia   . 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 Pre-diabetes   . Sleep apnea    wears CPAP    Patient Active Problem List   Diagnosis Date Noted  . ACL tear 03/08/2017    Past Surgical History:  Procedure Laterality Date  . ANTERIOR CRUCIATE LIGAMENT REPAIR Right 03/08/2017   Procedure: RECONSTRUCTION ANTERIOR CRUCIATE LIGAMENT (ACL) WITH HAMSTRING GRAFT, MEDIAL AND LATERAL MENISCAL REPAIRS;  Surgeon: Meredith Pel, MD;  Location: [place];  Service: Orthopedics;  Laterality: Right;  . KNEE ARTHROSCOPY WITH MENISCAL REPAIR Right 03/08/2017   Procedure: KNEE ARTHROSCOPY WITH MENISCAL REPAIR;  Surgeon: Meredith Pel, MD;  Location: [place];  Service: Orthopedics;  Laterality: Right;  . TUMOR REMOVAL     stomach  . WISDOM TOOTH EXTRACTION      OB History    No data available       Home Medications    Prior to Admission medications   Medication Sig Start Date End Date Taking? Authorizing Provider  Cholecalciferol (VITAMIN D)  2000 units CAPS Take 2,000 Units by mouth daily.   Yes [provider]  ferrous sulfate 325 (65 FE) MG EC tablet Take 325 mg by mouth 2 (two) times daily.   Yes [provider]  metFORMIN (GLUCOPHAGE-XR) 750 MG 24 hr tablet Take 750 mg by mouth 2 (two) times daily.   Yes [provider]  Multiple Vitamin (MULTIVITAMIN WITH MINERALS) TABS tablet Take 1 tablet by mouth daily.   Yes [provider]  methocarbamol (ROBAXIN) 500 MG tablet Take 1 tablet (500 mg total) by mouth every 6 (six) hours as needed for muscle spasms. 03/09/17   Meredith Pel, MD  oxyCODONE (OXY IR/ROXICODONE) 5 MG immediate release tablet Take 1 tablet (5 mg total) by mouth every 12 (twelve) hours as needed for severe pain. 04/07/17   Meredith Pel, MD  OxyCODONE HCl, Abuse Deter, (OXAYDO) 5 MG TABA Take 1-2 tablets by mouth every 4 (four) hours as needed.    [provider]  rivaroxaban (XARELTO) 10 MG TABS tablet Take 1 tablet (10 mg total) by mouth daily. 03/09/17   Meredith Pel, MD    Family History Family History  Problem Relation Age of Onset  . Cancer Other     Social History Social History  Substance Use Topics  . Smoking status:  Never Smoker  . Smokeless tobacco: Never Used  . Alcohol use No     Allergies   No known allergies   Review of Systems Review of Systems  Constitutional: Negative for activity change, chills and fever.  HENT: Negative.   Respiratory: Negative.   Cardiovascular: Negative.   Musculoskeletal:       As per HPI  Skin: Negative for color change, pallor and rash.  Neurological: Negative.   All other systems reviewed and are negative.    Physical Exam Triage Vital Signs ED Triage Vitals  Enc Vitals Group     BP 10/10/17 1157 139/85     Pulse Rate 10/10/17 1157 79     Resp 10/10/17 1157 16     Temp 10/10/17 1157 98 F (36.7 C)     Temp Source 10/10/17 1157 Oral     SpO2 10/10/17 1157 96 %     Weight --       Height --      Head Circumference --      Peak Flow --      Pain Score 10/10/17 1158 6     Pain Loc --      Pain Edu? --      Excl. in [place]? --    No data found.   Updated Vital Signs BP 139/85   Pulse 79   Temp 98 F (36.7 C) (Oral)   Resp 16   LMP 08/18/2017   SpO2 96%   Visual Acuity Right Eye Distance:   Left Eye Distance:   Bilateral Distance:    Right Eye Near:   Left Eye Near:    Bilateral Near:     Physical Exam  Constitutional: She is oriented to person, place, and time. She appears well-developed and well-nourished. No distress.  HENT:  Head: Normocephalic and atraumatic.  Eyes: Pupils are equal, round, and reactive to light. EOM are normal.  Neck: Normal range of motion. Neck supple.  Musculoskeletal:  Tenderness to the anterior quadricep muscles of the thigh as well as and adductor. Tenderness to the condyles of the femur and fibula. No swelling or deformity. No apparent effusion. Patient is able to flex and extend the knee normally. No laxity appreciated. Negative drawer. Negative varus, negative valgus. No pain with internal or external rotation.  Lymphadenopathy:    She has no cervical adenopathy.  Neurological: She is alert and oriented to person, place, and time. No cranial nerve deficit.  Skin: Skin is warm and dry.  Psychiatric: She has a normal mood and affect.  Nursing note and vitals reviewed.    UC Treatments / Results  Labs (all labs ordered are listed, but only abnormal results are displayed) Labs Reviewed - No data to display  EKG  EKG Interpretation None       Radiology No results found.  Procedures Procedures (including critical care time)  Medications Ordered in UC Medications - No data to display   Initial Impression / Assessment and Plan / UC Course  I have reviewed the triage vital signs and the nursing notes.  Pertinent labs & imaging results that were available during my care of the patient were reviewed by me and  considered in my medical decision making (see chart for details).    Ice to the knee off and on. Continue to perform the extension and flexion rehabilitation exercises. Heat to the muscles of the thigh. Perform stretches as demonstrated, slowly. If not getting better or if getting worse in the  next week call your orthopedist.    Final Clinical Impressions(s) / UC Diagnoses   Final diagnoses:  Right leg pain  Muscle strain  Knee strain, right, initial encounter    New Prescriptions New Prescriptions   No medications on file     Controlled Substance Prescriptions [place] Controlled Substance Registry consulted? Not Applicable   Janne Napoleon, NP 10/10/17 1249

## 2017-10-10 NOTE — ED Triage Notes (Signed)
Pt states last Wednesday she slipped and fell and injured her R leg, denies pain at the time, now c/o R leg pain. Ambulatory with steady gait.

## 2017-10-13 ENCOUNTER — Ambulatory Visit (INDEPENDENT_AMBULATORY_CARE_PROVIDER_SITE_OTHER): Payer: Managed Care, Other (non HMO) | Admitting: Orthopedic Surgery

## 2017-10-13 ENCOUNTER — Ambulatory Visit (INDEPENDENT_AMBULATORY_CARE_PROVIDER_SITE_OTHER): Payer: Self-pay

## 2017-10-13 ENCOUNTER — Ambulatory Visit (HOSPITAL_COMMUNITY)
Admission: RE | Admit: 2017-10-13 | Discharge: 2017-10-13 | Disposition: A | Discharge status: Home / Self Care | Payer: Managed Care, Other (non HMO) | Source: Ambulatory Visit | Attending: Orthopedic Surgery | Admitting: Orthopedic Surgery

## 2017-10-13 ENCOUNTER — Encounter (INDEPENDENT_AMBULATORY_CARE_PROVIDER_SITE_OTHER): Payer: Self-pay | Admitting: Orthopedic Surgery

## 2017-10-13 DIAGNOSIS — M25551 Pain in right hip: Secondary | ICD-10-CM | POA: Diagnosis present

## 2017-10-13 DIAGNOSIS — R937 Abnormal findings on diagnostic imaging of other parts of musculoskeletal system: Secondary | ICD-10-CM | POA: Insufficient documentation

## 2017-10-13 DIAGNOSIS — S72001A Fracture of unspecified part of neck of right femur, initial encounter for closed fracture: Secondary | ICD-10-CM

## 2017-10-13 DIAGNOSIS — W010XXA Fall on same level from slipping, tripping and stumbling without subsequent striking against object, initial encounter: Secondary | ICD-10-CM | POA: Diagnosis not present

## 2017-10-13 NOTE — Progress Notes (Signed)
Office Visit Note   Patient: Elaine Carr           Date of Birth: [birth year]           MRN: [id number] Visit Date: 10/13/2017 Requested by: No referring provider defined for this encounter. PCP: System, Provider Not In  Subjective: Chief Complaint  Patient presents with  . Right Knee - Follow-up    HPI: Elaine Carr   is a 21 year old patient with right hip pain.  She underwent anterior cruciate ligament reconstruction and medial meniscal repair on the right-hand side in March of this year.  Last week she slipped and fell and caught or self with impact on the right leg.  Now her right thigh is painful.  She denies any knee instability.  She reports discreet groin pain and difficulty ambulating and it is getting worse since the accident              ROS: All systems reviewed are negative as they relate to the chief complaint within the history of present illness.  Patient denies  fevers or chills.   Assessment & Plan: Visit Diagnoses:  1. Pain in right hip   2. Closed fracture of neck of right femur, initial encounter ([place])     Plan: Impression is right hip pain and antalgic gait to the right with irritable hip on exam and possible nondisplaced femoral neck fracture on plain radiographs.  These radiographs are difficult to interpret because of her soft tissue envelope.  The consequences of missing a femoral neck fracture in this patient who is morbidly obese and very young would be catastrophic.  For that reason I recommend MRI scanning today and nonweightbearing until she gets the scan.  If she requires surgery would likely be fixation with percutaneous pins.  If this is a muscle strain then back and be treated with mobilization as tolerated.  She needs the MRI scan today for evaluation.  Follow-Up Instructions: Return for after MRI.   Orders:  Orders Placed This Encounter  Procedures  . XR HIP UNILAT W OR W/O PELVIS 2-3 VIEWS RIGHT  . MR Hip Right w/o contrast   No orders of the  defined types were placed in this encounter.     Procedures: No procedures performed   Clinical Data: No additional findings.  Objective: Vital Signs: There were no vitals taken for this visit.  Physical Exam:   Constitutional: Patient appears well-developed HEENT:  Head: Normocephalic Eyes:EOM are normal Neck: Normal range of motion Cardiovascular: Normal rate Pulmonary/chest: Effort normal Neurologic: Patient is alert Skin: Skin is warm Psychiatric: Patient has normal mood and affect    Ortho Exam: Orthopedic exam demonstrates Trendelenburg and antalgic gait to the right.  Pain with internal/external rotation of the right hip along with hip flexion weakness on the right compared to the left.  Right knee has no effusion and graft feels stable.  Pedal pulses palpable knee range of motion good 0-125  Specialty Comments:  No specialty comments available.  Imaging: Xr Hip Unilat W Or W/o Pelvis 2-3 Views Right  Result Date: 10/13/2017 AP pelvis lateral right hip reviewed.  Bony detail is obscured by soft tissue envelope.  Cannot rule out nondisplaced femoral neck fracture.  Superior and inferior rami intact along with the proximal femoral shaft.  Recommend MRI scan for more detailed imaging of the femoral neck    PMFS History: Patient Active Problem List   Diagnosis Date Noted  . ACL tear 03/08/2017   Past  Medical History:  Diagnosis Date  . ACL (anterior cruciate ligament) tear     and lateral, medial meniscal tear  . Anemia   . 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 Pre-diabetes   . Sleep apnea    wears CPAP    Family History  Problem Relation Age of Onset  . Cancer Other     Past Surgical History:  Procedure Laterality Date  . ANTERIOR CRUCIATE LIGAMENT REPAIR Right 03/08/2017   Procedure: RECONSTRUCTION ANTERIOR CRUCIATE LIGAMENT (ACL) WITH HAMSTRING GRAFT, MEDIAL AND LATERAL MENISCAL REPAIRS;  Surgeon: Meredith Pel, MD;  Location: [facility];  Service:  Orthopedics;  Laterality: Right;  . KNEE ARTHROSCOPY WITH MENISCAL REPAIR Right 03/08/2017   Procedure: KNEE ARTHROSCOPY WITH MENISCAL REPAIR;  Surgeon: Meredith Pel, MD;  Location: [facility];  Service: Orthopedics;  Laterality: Right;  . TUMOR REMOVAL     stomach  . WISDOM TOOTH EXTRACTION     Social History   Occupational History  . Not on file.   Social History Main Topics  . Smoking status: Never Smoker  . Smokeless tobacco: Never Used  . Alcohol use No  . Drug use: No  . Sexual activity: Not on file

## 2018-01-31 ENCOUNTER — Other Ambulatory Visit: Payer: Self-pay

## 2018-01-31 ENCOUNTER — Emergency Department (HOSPITAL_COMMUNITY)
Admission: EM | Admit: 2018-01-31 | Discharge: 2018-01-31 | Disposition: A | Discharge status: Home / Self Care | Payer: Managed Care, Other (non HMO) | Attending: Emergency Medicine | Admitting: Emergency Medicine

## 2018-01-31 ENCOUNTER — Encounter (HOSPITAL_COMMUNITY): Payer: Self-pay

## 2018-01-31 DIAGNOSIS — Z7984 Long term (current) use of oral hypoglycemic drugs: Secondary | ICD-10-CM | POA: Diagnosis not present

## 2018-01-31 DIAGNOSIS — Z79899 Other long term (current) drug therapy: Secondary | ICD-10-CM | POA: Insufficient documentation

## 2018-01-31 DIAGNOSIS — R202 Paresthesia of skin: Secondary | ICD-10-CM | POA: Insufficient documentation

## 2018-01-31 DIAGNOSIS — M545 Low back pain, unspecified: Secondary | ICD-10-CM

## 2018-01-31 DIAGNOSIS — Z8543 Personal history of malignant neoplasm of ovary: Secondary | ICD-10-CM | POA: Diagnosis not present

## 2018-01-31 DIAGNOSIS — R319 Hematuria, unspecified: Secondary | ICD-10-CM | POA: Insufficient documentation

## 2018-01-31 LAB — URINALYSIS, ROUTINE W REFLEX MICROSCOPIC
Bilirubin Urine: NEGATIVE
GLUCOSE, UA: NEGATIVE mg/dL
Ketones, ur: NEGATIVE mg/dL
Leukocytes, UA: NEGATIVE
Nitrite: NEGATIVE
PH: 9 — AB (ref 5.0–8.0)
Protein, ur: NEGATIVE mg/dL
SPECIFIC GRAVITY, URINE: 1.02 (ref 1.005–1.030)

## 2018-01-31 LAB — POC URINE PREG, ED: Preg Test, Ur: NEGATIVE

## 2018-01-31 MED ORDER — CYCLOBENZAPRINE HCL 10 MG PO TABS: 10.0000 mg | ORAL_TABLET | Freq: Two times a day (BID) | ORAL | 0 refills | Status: AC | PRN

## 2018-01-31 NOTE — ED Triage Notes (Signed)
Pt reports central lower back pain that began on Saturday. Denies know injury, dysuria, frequent urination. PT states she was nauseated today.

## 2018-01-31 NOTE — ED Provider Notes (Signed)
[facility] EMERGENCY DEPARTMENT Provider Note   CSN: [id number] Arrival date & time: 01/31/18  1635     History   Chief Complaint Chief Complaint  Patient presents with  . Back Pain    HPI Elaine Carr is a 22 y.o. female.  HPI   22 year old female presents today with complaints of left lower back pain.  Patient notes symptoms started 2 days ago with left lower back pain.  She reports this felt achy and sharp.  She notes symptoms are worse with ambulation, worse with forward flexion and slightly worse with palpation.  She denies any significant distal neurological deficit.  Patient notes some paresthesias in the proximal left thigh earlier today, none presently.  Patient without abdominal pain nausea vomiting or fever.  In the past that have resolved with ibuprofen, the symptoms did not resolve with ibuprofen this time around.   Past Medical History:  Diagnosis Date  . ACL (anterior cruciate ligament) tear     and lateral, medial meniscal tear  . Anemia   . 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 Pre-diabetes   . Sleep apnea    wears CPAP    Patient Active Problem List   Diagnosis Date Noted  . ACL tear 03/08/2017    Past Surgical History:  Procedure Laterality Date  . ANTERIOR CRUCIATE LIGAMENT REPAIR Right 03/08/2017   Procedure: RECONSTRUCTION ANTERIOR CRUCIATE LIGAMENT (ACL) WITH HAMSTRING GRAFT, MEDIAL AND LATERAL MENISCAL REPAIRS;  Surgeon: Meredith Pel, MD;  Location: [facility];  Service: Orthopedics;  Laterality: Right;  . KNEE ARTHROSCOPY WITH MENISCAL REPAIR Right 03/08/2017   Procedure: KNEE ARTHROSCOPY WITH MENISCAL REPAIR;  Surgeon: Meredith Pel, MD;  Location: [facility];  Service: Orthopedics;  Laterality: Right;  . TUMOR REMOVAL     stomach  . WISDOM TOOTH EXTRACTION      OB History    No data available       Home Medications    Prior to Admission medications   Medication Sig Start Date End Date Taking? Authorizing  Provider  Cholecalciferol (VITAMIN D) 2000 units CAPS Take 2,000 Units by mouth daily.    [provider]  cyclobenzaprine (FLEXERIL) 10 MG tablet Take 1 tablet (10 mg total) by mouth 2 (two) times daily as needed for muscle spasms. 01/31/18   Ninoshka Wainwright, Dellis Filbert, PA-C  ferrous sulfate 325 (65 FE) MG EC tablet Take 325 mg by mouth 2 (two) times daily.    [provider]  metFORMIN (GLUCOPHAGE-XR) 750 MG 24 hr tablet Take 750 mg by mouth 2 (two) times daily.    [provider]  methocarbamol (ROBAXIN) 500 MG tablet Take 1 tablet (500 mg total) by mouth every 6 (six) hours as needed for muscle spasms. 03/09/17   Meredith Pel, MD  Multiple Vitamin (MULTIVITAMIN WITH MINERALS) TABS tablet Take 1 tablet by mouth daily.    [provider]    Family History Family History  Problem Relation Age of Onset  . Cancer Other     Social History Social History   Tobacco Use  . Smoking status: Never Smoker  . Smokeless tobacco: Never Used  Substance Use Topics  . Alcohol use: No  . Drug use: No     Allergies   No known allergies   Review of Systems Review of Systems  All other systems reviewed and are negative.    Physical Exam Updated Vital Signs BP (!) 151/92 (BP Location: Right Arm)  Pulse 79   Temp 98.4 F (36.9 C) (Oral)   Resp 18   LMP 12/17/2017   SpO2 100%   Physical Exam  Constitutional: She is oriented to person, place, and time. She appears well-developed and well-nourished.  HENT:  Head: Normocephalic and atraumatic.  Eyes: Conjunctivae are normal. Pupils are equal, round, and reactive to light. Right eye exhibits no discharge. Left eye exhibits no discharge. No scleral icterus.  Neck: Normal range of motion. No JVD present. No tracheal deviation present.  Pulmonary/Chest: Effort normal. No stridor.  Musculoskeletal:  Tenderness palpation of left lower lumbar musculature-pain increased with forward flexion, distal sensation  strength and motor function is intact  Neurological: She is alert and oriented to person, place, and time. Coordination normal.  Psychiatric: She has a normal mood and affect. Her behavior is normal. Judgment and thought content normal.  Nursing note and vitals reviewed.   ED Treatments / Results  Labs (all labs ordered are listed, but only abnormal results are displayed) Labs Reviewed  URINALYSIS, ROUTINE W REFLEX MICROSCOPIC - Abnormal; Notable for the following components:      Result Value   APPearance HAZY (*)    pH 9.0 (*)    Hgb urine dipstick MODERATE (*)    Bacteria, UA RARE (*)    Squamous Epithelial / LPF 6-30 (*)    All other components within normal limits  POC URINE PREG, ED    EKG  EKG Interpretation None       Radiology No results found.  Procedures Procedures (including critical care time)  Medications Ordered in ED Medications - No data to display   Initial Impression / Assessment and Plan / ED Course  I have reviewed the triage vital signs and the nursing notes.  Pertinent labs & imaging results that were available during my care of the patient were reviewed by me and considered in my medical decision making (see chart for details).      Final Clinical Impressions(s) / ED Diagnoses   Final diagnoses:  Acute left-sided low back pain without sciatica   Labs: Point of  care urine parenchyma urinalysis-hematuria  Imaging:  Consults:  Therapeutics:  Discharge Meds: flexeril   Assessment/Plan: 22 year old female presents today with left lower back pain.  This is most likely muscular in nature, tenderness palpation worse with forward flexion, no significant distal neurological deficits.  Patient will be given muscle relaxers and encouraged to use ibuprofen or Tylenol.  She did have blood in her urine, this is consistent with previous visits.  Patient denies vaginal bleeding but reports her menstrual cycle is seem to come on.  Due to repeat  evaluations with blood, patient will be referred to urology if symptoms persist.  I encouraged her to follow-up at her health center for repeat urinalysis to check for blood.  She is given strict return precautions, she verbalized understanding and agreement to this plan had no further questions or concerns.   ED Discharge Orders        Ordered    cyclobenzaprine (FLEXERIL) 10 MG tablet  2 times daily PRN     01/31/18 2035       Okey Regal, PA-C 01/31/18 2040    Hayden Rasmussen, MD 02/02/18 1144

## 2018-01-31 NOTE — Discharge Instructions (Signed)
Please read attached information. If you experience any new or worsening signs or symptoms please return to the emergency room for evaluation. Please follow-up with your primary care provider or specialist as discussed. Please use medication prescribed only as directed and discontinue taking if you have any concerning signs or symptoms.   °

## 2018-01-31 NOTE — ED Notes (Signed)
Pt unable to sign for discharge, Epic not working.

## 2018-10-29 IMAGING — MR MR HIP*R* W/O CM
4 of 8 series · 19 of 40 positions shown · non-contrast
Comparison: Right hip x-rays from same date.

CLINICAL DATA: Recent fall with worsening right leg pain. Evaluate
for right hip fracture.

EXAM:
MR OF THE RIGHT HIP WITHOUT CONTRAST
TECHNIQUE: Multiplanar, multisequence MR imaging was performed. No intravenous
contrast was administered.

[Series 6: T2 fat-sat · axial · 3.5mm · 0.70mm/px · z∈[+13,+140]mm · 5 of 33 slices shown (1 of 2)]
[im 1/33]
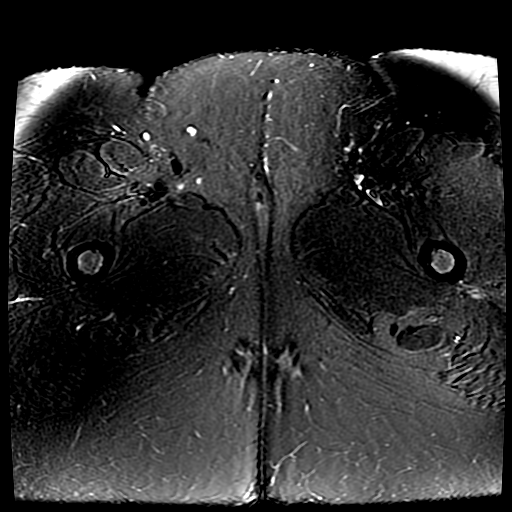
[im 9/33]
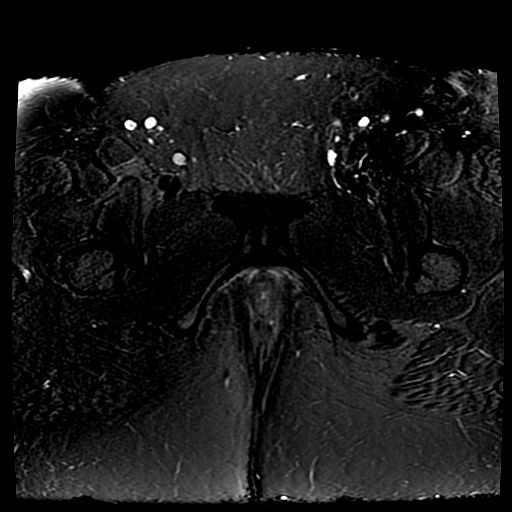
[im 17/33]
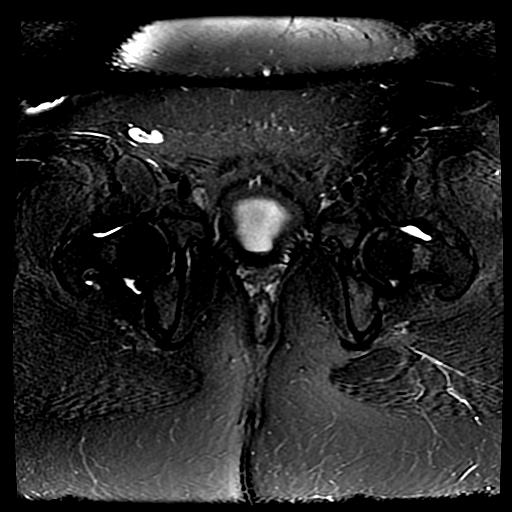
[im 25/33]
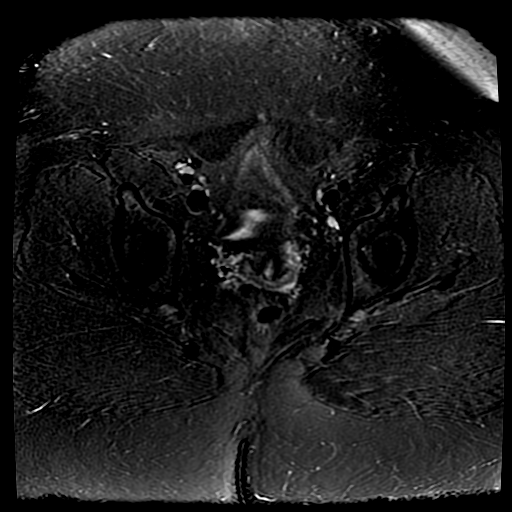
[im 33/33]
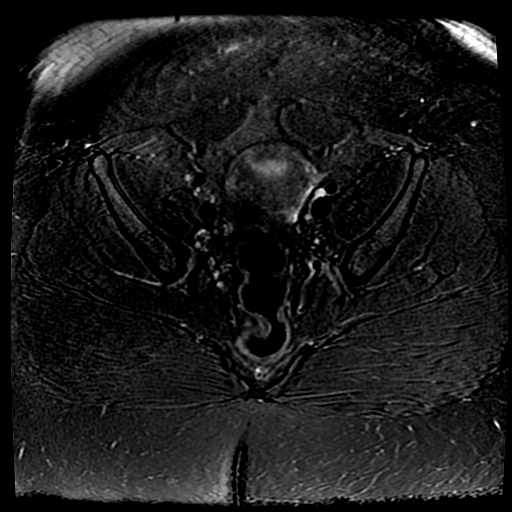

[Series 9: PD fat-sat · sagittal · 4.0mm · 0.27mm/px · 5 of 34 slices shown (1 of 2)]
[im 1/34]
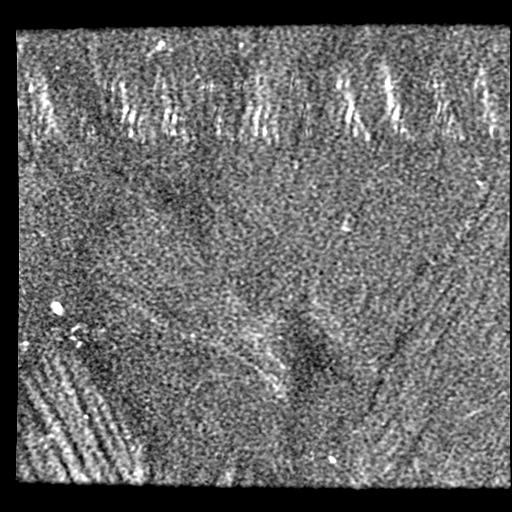
[im 9/34]
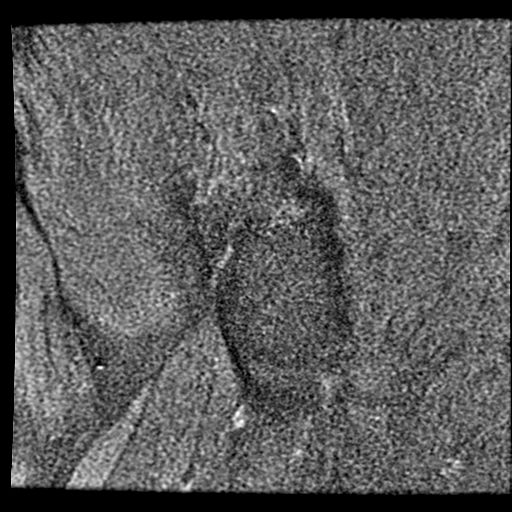
[im 17/34]
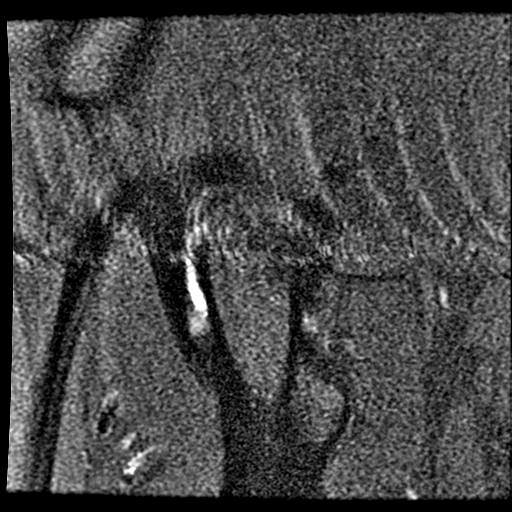
[im 25/34]
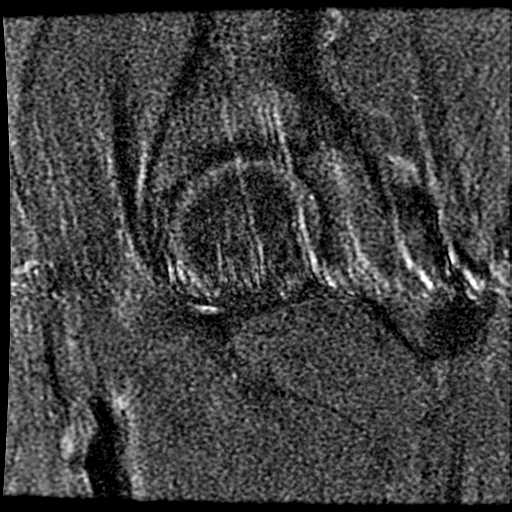
[im 34/34]
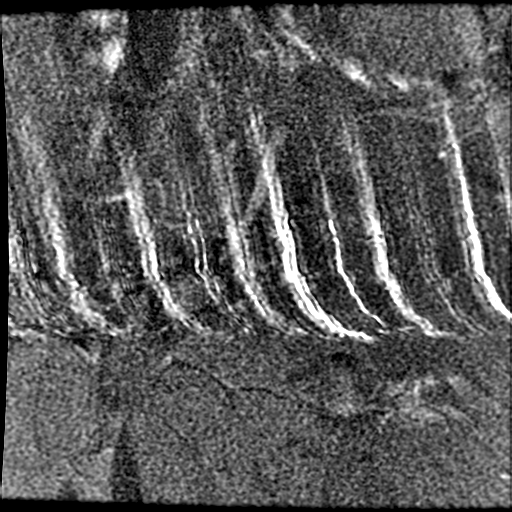

[T2 fat-sat · axial · 3.5mm · 0.70mm/px · z∈[+13,+140]mm · 4 of 33 slices shown (2 of 2)]
[im 1/33]
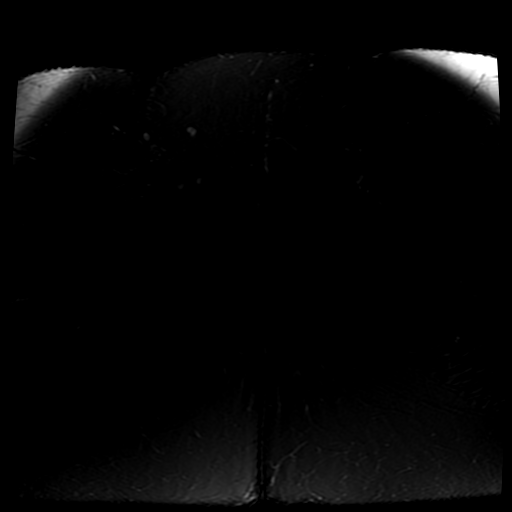
[im 9/33]
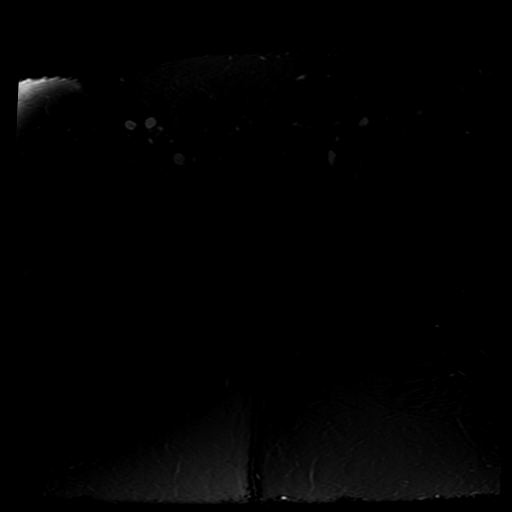
[im 17/33]
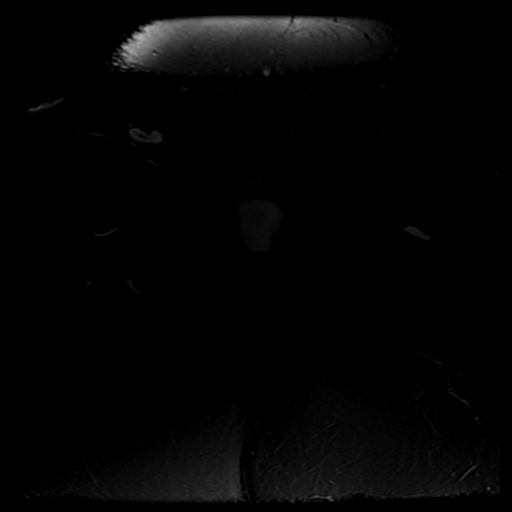
[im 33/33]
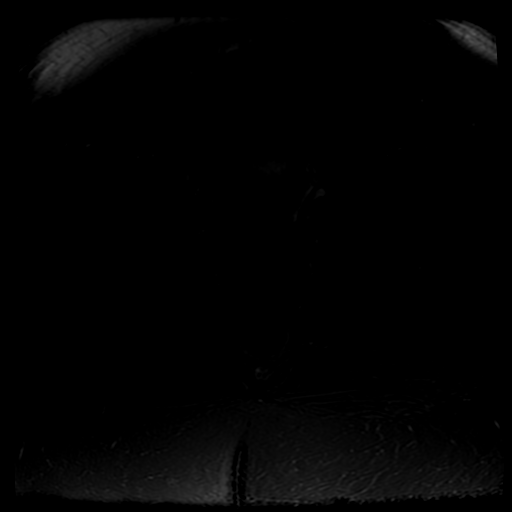

[PD fat-sat · sagittal · 4.0mm · 0.27mm/px · 5 of 34 slices shown (2 of 2)]
[im 1/34]
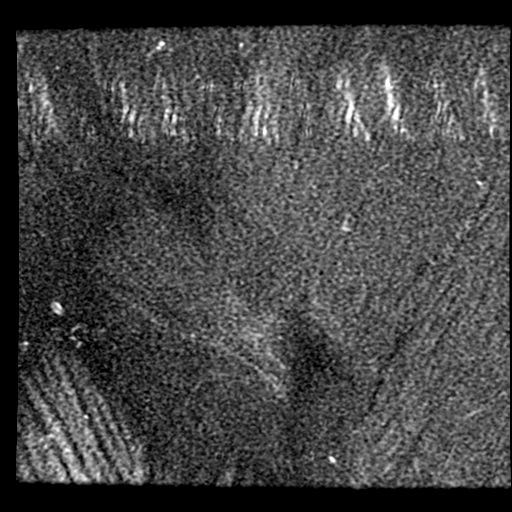
[im 9/34]
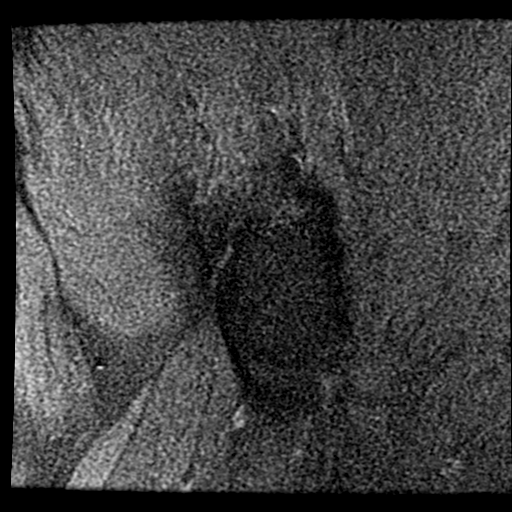
[im 17/34]
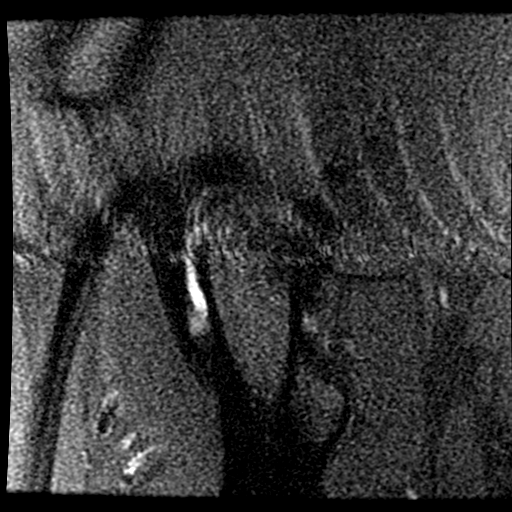
[im 25/34]
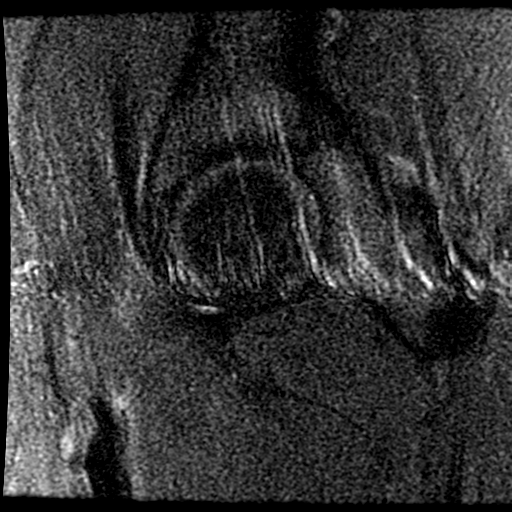
[im 34/34]
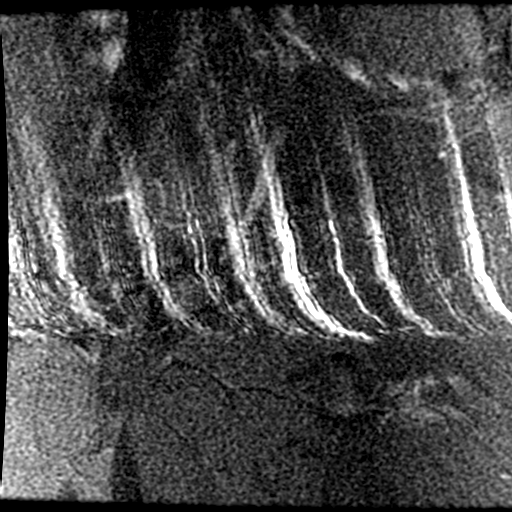

[19 of 40 positions shown; findings below may reference images not displayed]

FINDINGS: Bones: There is no evidence of acute fracture, dislocation or
avascular necrosis. The visualized bony pelvis appears normal. The
visualized sacroiliac joints and symphysis pubis appear normal. Disc
desiccation at L5-S1.

Articular cartilage and labrum

Articular cartilage: No focal chondral defect or subchondral signal
abnormality identified.

Labrum: There is no gross labral tear or paralabral abnormality.

Joint or bursal effusion

Joint effusion: No significant hip joint effusion.

Bursae: No focal periarticular fluid collection.

Muscles and tendons

Muscles and tendons: The visualized gluteus, hamstring and iliopsoas
tendons appear normal. The piriformis muscles appear symmetric.

Other findings

Miscellaneous: Mild circumferential bladder wall thickening, which
may be related to partial decompression. The visualized internal
pelvic contents otherwise appear unremarkable.
IMPRESSION: Normal MRI of the right hip.  No evidence of fracture.

These results were called by telephone at the time of interpretation
on 10/13/2017 at [DATE] to Dr. MELCHORITA BASQUEZ , who verbally
acknowledged these results.

## 2020-01-21 ENCOUNTER — Ambulatory Visit: Admit: 2020-01-21 | Payer: PRIVATE HEALTH INSURANCE | Primary: Adult Health

## 2020-01-28 ENCOUNTER — Telehealth: Admit: 2020-01-28 | Payer: MEDICAID | Attending: Gynecologic Oncology | Primary: Adult Health

## 2020-01-29 DIAGNOSIS — N8500 Endometrial hyperplasia, unspecified: Secondary | ICD-10-CM

## 2020-01-29 NOTE — Progress Notes
TELEPHONE VISIT: For this visit the clinician and patient were present via telephone (audio only).Patient counseled on available options for visit type; Patient elected telephone visit; Patient consent given for telephone visit: YesPatient Identity was confirmed during this call.  Other individuals actively participating in the telephone encounter and their name/relation to the patient: noneTotal time spent in medical telephone consultation: 12 min Because this visit was completed over telephone, a hands-on physical exam was not performed.  Patient understands and knows to call back if condition changes. The visit type for this patient required modifications due to the COVID-19 outbreak.Re: Gina Aguilar (1996/09/08)MRN: [id number] Provider: Pricilla Riffle, MDDate of service: 2/15/2021REFERRING PROVIDER: No ReferringREASON FOR VISIT: HISTORY OF PRESENT ILLNESS: Gina Aguilar is a 24 y.o. female who [facility]/2020 for follow up. 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  She currently has IUD.  She was supposed to have ultrasound.  However, she missed the ultrasound because her roommates had COVID.  However, she is doing well with no abnormal bleeding and IUD is in place.REPRODUCTIVE HISTORY: OB History   Gravida 0  Para 0  Term 0  Preterm 0  AB 0  Living 0   SAB 0  TAB 0  Ectopic 0  Molar    Multiple 0  Live Births      PAST HISTORY: Past Medical History: Diagnosis Date ? BMI 60.0-69.9, adult (HC Code)  ? Cancer [facility] Code) 2012  Right ovarian granulosa cell carcinoma ? Depression 09/20/2012 ? OSA (obstructive sleep apnea) 10/17/2014  CPAP ? PCOS (polycystic ovarian syndrome) 09/20/2012  Past Surgical History: Procedure Laterality Date ? 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 DENTAL SURGERY   ? DILATION AND CURETTAGE OF UTERUS  04/2016  Dr Earnest Conroy ([facility]) ? KNEE ARTHROSCOPY Right 02/2017  acl repair and meniscal repair ? OVARIAN CYST REMOVAL Right 2018  Dr Elenor Quinones- benign  Current Outpatient Medications Medication Sig Dispense Refill ? desogestreL-ethinyl estradioL (APRI) 0.15-0.03 mg per tablet Take 1 tablet by mouth daily. (Patient not taking: Reported on 12/03/2019) 28 tablet 0 ? guaiFENesin (ROBITUSSIN) 100 mg/5 mL Liqd Take 5-10 mLs (100-200 mg total) by mouth every 4 (four) hours as needed for cough. (Patient not taking: Reported on 12/03/2019) 60 mL 0 ? ibuprofen (ADVIL,MOTRIN) 600 mg tablet Take 1 tablet (600 mg total) by mouth every 6 (six) hours as needed. (Patient not taking: Reported on 12/03/2019) 60 tablet 0 ? multivitamin (THERAGRAN) tablet Take 1 tablet by mouth daily. (Patient not taking: Reported on 11/30/2018) 90 tablet 3 No current facility-administered medications for this visit.  No Known Allergies Social History Tobacco Use ? Smoking status: Never Smoker ? Smokeless tobacco: Never Used Substance Use Topics ? Alcohol use: No  Family History Problem Relation Age of Onset ? Hypertension Mother  ? Stroke Mother 39      HTN related WUJ:[id number] any abnormal breathing.PHYSICAL EXAM:There were no vitals taken for this visit.  ASSESSMENT/PLAN: I had a long discussion with the patient.  Ultrasound was ordered in about 4 weeks and she will see me after that, either in person or in televisit.  Will recheck the results of lining.  If the lining is thicker, she will return for biopsy.  Otherwise, continue followup.  All questions were answered.Thank you for allowing me to participate in the management and care of this patient.Sincerely,Mackenzie Lia, MDElectronically Signed by Pricilla Riffle, MD, January 28, 2020

## 2020-02-22 ENCOUNTER — Inpatient Hospital Stay: Admit: 2020-02-22 | Discharge: 2020-02-22 | Payer: MEDICAID | Primary: Adult Health

## 2020-02-22 DIAGNOSIS — N8500 Endometrial hyperplasia, unspecified: Secondary | ICD-10-CM

## 2020-02-25 ENCOUNTER — Encounter: Admit: 2020-02-25 | Payer: PRIVATE HEALTH INSURANCE | Attending: Gynecologic Oncology | Primary: Adult Health

## 2020-02-25 ENCOUNTER — Ambulatory Visit: Admit: 2020-02-25 | Payer: MEDICAID | Attending: Gynecologic Oncology | Primary: Adult Health

## 2020-02-25 DIAGNOSIS — N8501 Benign endometrial hyperplasia: Secondary | ICD-10-CM

## 2020-02-25 DIAGNOSIS — C801 Malignant (primary) neoplasm, unspecified: Secondary | ICD-10-CM

## 2020-02-25 DIAGNOSIS — G4733 Obstructive sleep apnea (adult) (pediatric): Secondary | ICD-10-CM

## 2020-02-25 DIAGNOSIS — E282 Polycystic ovarian syndrome: Secondary | ICD-10-CM

## 2020-02-25 DIAGNOSIS — F32A Depression: Secondary | ICD-10-CM

## 2020-02-25 DIAGNOSIS — Z6841 Body Mass Index (BMI) 40.0 and over, adult: Secondary | ICD-10-CM

## 2020-02-25 MED ORDER — SPIRONOLACTONE ORAL
ORAL | Status: AC
Start: 2020-02-25 — End: 2020-12-31

## 2020-02-26 ENCOUNTER — Encounter: Admit: 2020-02-26 | Payer: PRIVATE HEALTH INSURANCE | Attending: Gynecologic Oncology | Primary: Adult Health

## 2020-02-26 DIAGNOSIS — F32A Depression: Secondary | ICD-10-CM

## 2020-02-26 DIAGNOSIS — E282 Polycystic ovarian syndrome: Secondary | ICD-10-CM

## 2020-02-26 DIAGNOSIS — Z6841 Body Mass Index (BMI) 40.0 and over, adult: Secondary | ICD-10-CM

## 2020-02-26 DIAGNOSIS — G4733 Obstructive sleep apnea (adult) (pediatric): Secondary | ICD-10-CM

## 2020-02-26 DIAGNOSIS — C801 Malignant (primary) neoplasm, unspecified: Secondary | ICD-10-CM

## 2020-02-27 NOTE — Progress Notes
RE:Gina Aguilar: [id number] DOB: December 04, 1997Referring Provider: Referring, NoNo address on filePrimary Care Provider: Pcp, NoProvider:Jakyah Bradby, APRNDate of Service: 3/15/2021CC: Follow-upHPI:Gina Aguilar is a 24 y.o. female who presents to the [facility]cology [facility] on 02/25/2020 for followup history of abnormal uterine bleeding, endometrial hyperplasia. ?She is an 24 year old female,?status post right ovarian cystectomy Apr 19, 2011. 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 ?Pathology was reviewed by Ursula Beath MD, Department of Pathology [facility] in [place], [place]. ?No adjuvant therapy was advised.?Her history is notable for chronic menometrorrhagia, with long periods of amenorrhea. The patient has been followed closely by [facility]. ?On?December 21, 2015, the patient underwent an endometrial biopsy for a thickened endometrium at 13.5 mm, [place] also showed a right ovarian complex cyst. Final pathology revealed endometrial biopsy,?hyperplasia without atypia (simple). Cervical cytology performed June 11, 2014; was negative for intraepithelial lesion or malignancy. ?The patient states that her bleeding pattern is as follows: ?From October to November 2016 the patient bled for approximately 30 days. ?December 21, 2015, the patient bled for approximately 2 weeks. She underwent a repeat ultrasound this past March at the end of the month that showed a persistent right ovarian cyst, somewhat larger, measuring 4 to 5 cm,?versus approximately 2 cm. ?This did not appear to have any complex internal architecture or vascularity. ?Her tumor markers in January were normal, inhibin A and inhibin B.?She has been followed by Dr. Earnest Conroy and was  identified as having non-atypical hyperplasia. She was placed on Provera 20 mg a day, and had near daily bleeding, and was placed on a Provera taper.?Inhibin A and B, stable remain within normal limits. ?She underwent a repeat endometrial biopsy with Dr. Earnest Conroy, which revealed inactive endometrium, cystically dilated glands, and pseudodecidual changes consistent with treated hyperplasia. ?She was having persistent menorrhagia/metrorrhagia, and she most recently was taken to the operating room for an evaluation under anesthesia, hysteroscopy and placement of a Mirena IUD on May 09, 2016. The endometrial curettings came back revealing weakly proliferative endometrium with marked stromal breakdown changes and fragments of benign endocervical tissue and benign squamous epithelium. ?There was no hyperplasia. ?TVUS done on 02/22/2020 revealed endometrial stripe 2.1 mm. IUD appears low lyingShe presents today for follow up. She reports irregular menses, her LMP was on 11/18/2019. Denies abdominal or pelvic pain. Reports good bowel and bladder function. ?Cancer StagingNo matching staging information was found for the patient.Oncology History  No history exists. Patient Active Problem List Diagnosis ? Juvenile granulosa cell tumor ? PCOS (polycystic ovarian syndrome) ? Morbid obesity with body mass index of 60.0-69.9 in adult [facility] Code) ? Endometrial hyperplasia without atypia, simple ? Obstructive sleep apnea ? Ovarian cyst, right ? Problem with vascular access ? S/P ovarian cystectomy ? S/P ACL reconstruction ? Abnormal uterine bleeding (AUB) ? Abnormal hair growth from preauricular area towards lateral cheekbone  PAST MEDICAL HX: Gina Aguilar  has a past medical history of BMI 60.0-69.9, adult (HC Code), Cancer (HC Code) (2012), Depression (09/20/2012), OSA (obstructive sleep apnea) (10/17/2014), and PCOS (polycystic ovarian syndrome) (09/20/2012).PAST SURGICAL HX:  She  has a past surgical history that includes Ovarian cyst removal (Right, 2018); Knee arthroscopy (Right, 02/2017); Abdominal surgery (2012); Dilation and curettage of uterus (04/2016); and Dental surgery.SOCIAL HX: Patient  reports that she has never smoked. She has never used smokeless tobacco. She reports that she does not drink alcohol or use drugs.FAMILY HX: Her family history includes Hypertension in her mother; Stroke (age of onset: 76) in her mother.ALLERGIES: Patient has  No Known Allergies.CURRENT MEDICATIONS: ? SPIRONOLACTONE ORAL Take by mouth. ? desogestreL-ethinyl estradioL Take 1 tablet by mouth daily. (Patient not taking: Reported on 12/03/2019) ? guaiFENesin Take 5-10 mLs (100-200 mg total) by mouth every 4 (four) hours as needed for cough. (Patient not taking: Reported on 12/03/2019) ? ibuprofen Take 1 tablet (600 mg total) by mouth every 6 (six) hours as needed. (Patient not taking: Reported on 12/03/2019) ? multivitamin Take 1 tablet by mouth daily. (Patient not taking: Reported on 11/30/2018) ROS: See HPIConstitutional: No weight loss, no worsening fatigue Eyes: No visual problems, no eye discharge, no eye painSkin: No rashes, no lesions, no hypo or hyperpigmentation Ears/Nose/Throat: No nasal discharge, no throat pain, no hearing loss/difficulty, no tinnitus Gastrointestinal: No nausea, no vomiting, diarrhea or refluxChest: No chest pain, no cough, no sputum productionMuscular: No muscle pain, no muscle weaknessNeurological: No dysarthria, No arm or leg weakness, no visual disturbances, no gait disturbancesGenitourinary: No vaginal discharge, bleeding, no urinary frequency, dysuria, no nocturia, no hematuriaEndocrine: No neck swelling, no polydypsia, polyphagia, no polyuria, no galactorrhea Mental Status: No confusion, no memory loss, no word finding difficulties  Immunology: No enlarged lymph nodesCardiovascular: No palpitations, no leg swelling, no SOB, no orthopnea, no paroxysmal nocturnal dyspnea . Hematology/Lymph: No syncope, no fatigue, no bone painPHYSICAL EXAM:BP (!) 138/44  - Pulse 66  - Temp 98.5 ?F (36.9 ?C) (Oral)  - Wt (!) 195 kg  - LMP 10/06/2019  - SpO2 100%  - BMI 69.40 kg/m? APPEARANCE: Appropriate for age. Body mass index is 69.4 kg/m?.ABDOMEN: Abdomen was soft, non-tender with no masses, hepatosplenomegaly or hernias. BREAST:  Deferred. PELVIC EXAM: Normal external genitalia and smooth vagina.The uterus, cervix, and adnexa are within normal limits. IUD strings visualized. MUSCULOSKELETAL: The upper and lower extremities had full range of movement and had equal muscle strength. SKIN: No rashes, lesions, or subcutaneous nodules notedNEUROLOGIC: Grossly intactMENTAL STATUS: Oriented to person, place, time. Affect was appropriate.Prescriptions written this visit:Requested Prescriptions  No prescriptions requested or ordered in this encounter Lab Results Component Value Date  WBC 12.0 (H) 09/04/2019  ANCANC 9.6 (H) 09/04/2019  HGB 13.9 09/04/2019  HCT 41.6 09/04/2019  MCV 90.2 09/04/2019  PLT 394 09/04/2019 ASSESSMENT/PLAN:24 year old?with a history of PCOS and abnormal uterine bleeding, diagnosed with non atypical endometrial hyperplasia in the past, for which she was placed on oral progestin. She had continuous uterine bleeding for several months, and underwent a hysteroscopy D&C and Mirena placement, pathology which revealed no residual hyperplasia. She continues with Mirena in place. TVUS done on 02/22/2020 revealed endometrial stripe 2.1 mm. - She will have a TVUS done prior to her next visit- Gina Aguilar has been asked to return to our clinic in 6months time for reevaluation. 		All of the patient's questions were answered and she was in agreement with this plan.Thank you for your referral of this patient and for allowing [place] to participate in her care.Rhetta Mura, APRNElectronically Signed by Rhetta Mura, APRN, 3/15/2021The plan was discussed with Dr. Pricilla Riffle, MD.The patient was seen and examined with Evert Kohl, MD.

## 2020-02-28 ENCOUNTER — Encounter: Admit: 2020-02-28 | Payer: PRIVATE HEALTH INSURANCE | Attending: Adult Health | Primary: Adult Health

## 2020-02-28 DIAGNOSIS — M25561 Pain in right knee: Secondary | ICD-10-CM

## 2020-03-06 ENCOUNTER — Ambulatory Visit: Admit: 2020-03-06 | Payer: PRIVATE HEALTH INSURANCE | Primary: Adult Health

## 2020-03-11 ENCOUNTER — Inpatient Hospital Stay: Admit: 2020-03-11 | Discharge: 2020-03-11 | Payer: MEDICAID | Primary: Adult Health

## 2020-03-11 DIAGNOSIS — M25561 Pain in right knee: Secondary | ICD-10-CM

## 2020-03-28 ENCOUNTER — Inpatient Hospital Stay: Admit: 2020-03-28 | Discharge: 2020-03-29 | Payer: MEDICAID | Primary: Adult Health

## 2020-03-28 ENCOUNTER — Emergency Department: Admit: 2020-03-28 | Payer: PRIVATE HEALTH INSURANCE | Primary: Adult Health

## 2020-03-28 DIAGNOSIS — H538 Other visual disturbances: Secondary | ICD-10-CM

## 2020-03-28 DIAGNOSIS — R519 Headache, unspecified: Secondary | ICD-10-CM

## 2020-03-28 DIAGNOSIS — R110 Nausea: Secondary | ICD-10-CM

## 2020-03-28 LAB — COMPREHENSIVE METABOLIC PANEL
BKR A/G RATIO: 1.3 (ref 1.0–2.2)
BKR ALANINE AMINOTRANSFERASE (ALT): 16 U/L (ref 10–35)
BKR ALBUMIN: 4.4 g/dL (ref 3.6–4.9)
BKR ALKALINE PHOSPHATASE: 112 U/L (ref 9–122)
BKR ANION GAP: 9 (ref 7–17)
BKR ASPARTATE AMINOTRANSFERASE (AST): 23 U/L (ref 10–35)
BKR BILIRUBIN TOTAL: 0.4 mg/dL (ref <=1.2)
BKR BLOOD UREA NITROGEN: 12 mg/dL (ref 6–20)
BKR BUN / CREAT RATIO: 19.4 (ref 8.0–23.0)
BKR CALCIUM: 9.5 mg/dL (ref 8.8–10.2)
BKR CHLORIDE: 105 mmol/L (ref 98–107)
BKR CO2: 26 mmol/L (ref 20–30)
BKR CREATININE: 0.62 mg/dL (ref 0.40–1.30)
BKR EGFR (AFR AMER): >60 mL/min/{1.73_m2} (ref >60)
BKR EGFR (NON AFRICAN AMERICAN): >60 mL/min/{1.73_m2} (ref >60)
BKR GLOBULIN: 3.4 g/dL
BKR GLUCOSE: 101 mg/dL — ABNORMAL HIGH (ref 70–100)
BKR POTASSIUM: 4 mmol/L (ref 3.3–5.1)
BKR PROTEIN TOTAL: 7.8 g/dL (ref 6.6–8.7)
BKR SODIUM: 140 mmol/L (ref 136–144)

## 2020-03-28 LAB — CBC WITH AUTO DIFFERENTIAL
BKR WAM ABSOLUTE IMMATURE GRANULOCYTES: 0 x 1000/ÂµL (ref 0.0–0.4)
BKR WAM ABSOLUTE LYMPHOCYTE COUNT: 1.9 x 1000/ÂµL (ref 0.5–5.4)
BKR WAM ABSOLUTE NRBC: 0 x 1000/ÂµL
BKR WAM ANALYZER ANC: 8.8 x 1000/ÂµL — ABNORMAL HIGH (ref 2.2–7.2)
BKR WAM BASOPHIL ABSOLUTE COUNT: 0 x 1000/ÂµL (ref 0.0–0.2)
BKR WAM BASOPHILS: 0.3 % (ref 0.0–2.0)
BKR WAM EOSINOPHIL ABSOLUTE COUNT: 0.1 x 1000/ÂµL (ref 0.0–0.4)
BKR WAM EOSINOPHILS: 0.7 % (ref 0.0–4.0)
BKR WAM HEMATOCRIT: 41.3 % (ref 36.0–48.0)
BKR WAM HEMOGLOBIN: 13.4 g/dL (ref 12.0–15.0)
BKR WAM IMMATURE GRANULOCYTES: 0.3 % (ref 0.0–0.4)
BKR WAM LYMPHOCYTES: 16.6 % (ref 10.0–50.0)
BKR WAM MCH (PG): 30 pg (ref 25.0–35.0)
BKR WAM MCHC: 32.4 g/dL — ABNORMAL LOW (ref 33.0–37.0)
BKR WAM MCV: 92.4 fL (ref 81.0–99.0)
BKR WAM MONOCYTE ABSOLUTE COUNT: 0.8 x 1000/ÂµL (ref 0.1–1.2)
BKR WAM MONOCYTES: 6.5 % (ref 3.0–11.0)
BKR WAM MPV: 9.2 fL (ref 8.0–12.0)
BKR WAM NEUTROPHILS: 75.6 % (ref 45.0–90.0)
BKR WAM NUCLEATED RED BLOOD CELLS: 0 % (ref 0.0–0.0)
BKR WAM PLATELETS: 450 x1000/ÂµL (ref 120–450)
BKR WAM RDW-CV: 14.4 % (ref 11.5–14.5)
BKR WAM RED BLOOD CELL COUNT: 4.5 M/ÂµL (ref 3.5–5.5)
BKR WAM WHITE BLOOD CELL COUNT: 11.6 x1000/ÂµL — ABNORMAL HIGH (ref 4.8–10.8)

## 2020-03-28 LAB — MAGNESIUM: BKR MAGNESIUM: 2.1 mg/dL (ref 1.7–2.4)

## 2020-03-28 MED ORDER — SODIUM CHLORIDE 0.9 % BOLUS (NEW BAG)
0.9 % | Freq: Once | INTRAVENOUS | Status: CP
Start: 2020-03-28 — End: ?
  Administered 2020-03-28: 17:00:00 0.9 mL/h via INTRAVENOUS

## 2020-03-28 MED ORDER — METOCLOPRAMIDE 5 MG/ML INJECTION SOLUTION
5 mg/mL | Freq: Once | INTRAVENOUS | Status: CP
Start: 2020-03-28 — End: ?
  Administered 2020-03-28: 17:00:00 5 mL via INTRAVENOUS

## 2020-03-28 MED ORDER — KETOROLAC 30 MG/ML (1 ML) INJECTION SOLUTION
301 mg/mL (1 mL) | Status: DC
Start: 2020-03-28 — End: 2020-03-29

## 2020-03-28 MED ORDER — IBUPROFEN 600 MG TABLET
600 mg | ORAL_TABLET | Freq: Four times a day (QID) | ORAL | 1 refills | Status: AC | PRN
Start: 2020-03-28 — End: 2020-12-31

## 2020-03-28 MED ORDER — IOHEXOL 350 MG IODINE/ML INTRAVENOUS SOLUTION
350 mg iodine/mL | Freq: Once | INTRAVENOUS | Status: CP | PRN
Start: 2020-03-28 — End: ?
  Administered 2020-03-28: 21:00:00 350 mL via INTRAVENOUS

## 2020-03-28 MED ORDER — KETOROLAC 30 MG/ML (1 ML) INJECTION SOLUTION
30 mg/mL (1 mL) | Freq: Once | INTRAVENOUS | Status: CP
Start: 2020-03-28 — End: ?
  Administered 2020-03-28: 17:00:00 30 mL via INTRAVENOUS

## 2020-03-28 MED ORDER — KETOROLAC 30 MG/ML (1 ML) INJECTION SOLUTION
301 mg/mL (1 mL) | Freq: Once | INTRAVENOUS | Status: CP
Start: 2020-03-28 — End: ?
  Administered 2020-03-29: 02:00:00 30 mL via INTRAVENOUS

## 2020-03-28 MED ORDER — ACETAMINOPHEN 325 MG TABLET
325 mg | Freq: Once | ORAL | Status: CP
Start: 2020-03-28 — End: ?
  Administered 2020-03-28: 16:00:00 325 mg via ORAL

## 2020-03-28 MED ORDER — DIPHENHYDRAMINE 50 MG/ML INJECTION SOLUTION
50 mg/mL | Freq: Once | INTRAVENOUS | Status: CP
Start: 2020-03-28 — End: ?
  Administered 2020-03-28: 17:00:00 50 mL via INTRAVENOUS

## 2020-03-28 NOTE — ED Triage Note
[employer] vaccine 1 week ago. Progressive headache since then. Nausea and vomiting. Feels like prior migraine. Blurry vision. I evaluated this patient as the Physician in triage and completed a medical screening examination.  Patient will need further care.  Initial care orders will be placed as appropriate.Lise Auer Kenneith Stief, DO11:47 AM

## 2020-03-28 NOTE — ED Notes
4:00 PM Pt moved to CC to await [place] scan.

## 2020-03-28 NOTE — ED Notes
1:26 PM pt received J&J covid vax last Tuesday and H/A continued till 3 days after the vax.  The H/A then were D/Ced. Monday morning the migraines re-started. Denies vomiting-only nausea- + diarrhea 2 times today. Provider eval pending

## 2020-03-29 DIAGNOSIS — E669 Obesity, unspecified: Secondary | ICD-10-CM

## 2020-03-29 DIAGNOSIS — Z8543 Personal history of malignant neoplasm of ovary: Secondary | ICD-10-CM

## 2020-03-29 DIAGNOSIS — Z6841 Body Mass Index (BMI) 40.0 and over, adult: Secondary | ICD-10-CM

## 2020-03-29 DIAGNOSIS — G4733 Obstructive sleep apnea (adult) (pediatric): Secondary | ICD-10-CM

## 2020-03-29 DIAGNOSIS — R519 Headache, unspecified: Secondary | ICD-10-CM

## 2020-03-29 LAB — EXTRA BLUE TOP SPECIMEN     (BH GH LMW)

## 2020-03-29 NOTE — Other
Pt was aware of findings. Will follow up with pcp for ent referral

## 2020-03-29 NOTE — ED Notes
9:34 PM In CC patiently waiting for 'a specialist or something' referring to ongoing headache which has returned.  Had resolved somewhat during time of [place] Scan.  No NV since arrival and has been NPO since arrival.

## 2020-03-29 NOTE — ED Notes
9:52 PM Requesting pain medication.  Now a 4/10 but concerned about it getting worse.

## 2020-03-29 NOTE — Discharge Instructions
Take 600 milligrams of ibuprofen every 6 hours, 500 to 1000 milligrams of acetaminophen every 4 hours for pain. Follow-up with endocrinology in primary care for the next available appointments.  Return the emergency department for any difficulty obtaining appointments or worsening symptoms.

## 2020-03-29 NOTE — ED Provider Notes
HistoryChief Complaint Patient presents with ? Headache- New Onset Or New Symptoms   nausea blurry visopn h/o migraines  24 year old female with PMHx of PCOS, OSA, obesity presents with headache x 3 days duration. Patient reports waking up this morning with a mild headache but reports sudden onset worsening around 8:00 a.m. which prompted her visit. Patient denies maximal pain at onset. Headache is associated with photophobia, phonophobia, nausea, and dizziness. Patient denies trauma, visual disturbance, numbness, weakness, confusion, neck pain/stiffness, fever, chills, vomiting, rhinorrhea, CP, SOB. Patient received the J&J COVID-19 vaccine on 03/18/20. The history is provided by the patient. HeadacheThe current episode started more than 2 days ago. Associated symptoms include headaches. Pertinent negatives include no chest pain and no shortness of breath.  Past Medical History: Diagnosis Date ? BMI 60.0-69.9, adult (HC Code)  ? Cancer [facility] Code) 2012  Right ovarian granulosa cell carcinoma ? Depression 09/20/2012 ? OSA (obstructive sleep apnea) 10/17/2014  CPAP ? PCOS (polycystic ovarian syndrome) 09/20/2012 Past Surgical History: Procedure Laterality Date ? 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 DENTAL SURGERY   ? DILATION AND CURETTAGE OF UTERUS  04/2016  Dr Earnest Conroy ([facility]) ? KNEE ARTHROSCOPY Right 02/2017  acl repair and meniscal repair ? OVARIAN CYST REMOVAL Right 2018  Dr Elenor Quinones- benign Family History Problem Relation Age of Onset ? Hypertension Mother  ? Stroke Mother 83      HTN related Social History Socioeconomic History ? Marital status: Single   Spouse name: Not on file ? Number of children: Not on file ? Years of education: Not on file ? Highest education level: Not on file Tobacco Use ? Smoking status: Never Smoker ? Smokeless tobacco: Never Used Substance and Sexual Activity ? Alcohol use: No ? Drug use: No ? Sexual activity: Not Currently   Birth control/protection: None Other Topics Concern ? Smoke Detectors in the home Yes ? Carbon Monoxide detectors Yes ? Bullying Risk No ? Guns in the home No Social History Narrative  Lives with foster mother. No smokers at home. Senior in high school. Going to [employer]  ED Other Social History ? E-cigarette status Never User  ? E-Cigarette Use Never User  ? Cannabis frequency 1-2 times/week 1-2 times/week on 09/04/2019 E-cigarette/Vaping Substances E-cigarette/Vaping Devices Review of Systems Constitutional: Negative for chills and fever. HENT: Negative for ear pain, rhinorrhea and sore throat.  Eyes: Positive for photophobia. Negative for pain and visual disturbance. Respiratory: Negative for shortness of breath.  Cardiovascular: Negative for chest pain. Gastrointestinal: Positive for nausea. Negative for vomiting. Musculoskeletal: Negative for neck pain and neck stiffness. Neurological: Positive for dizziness and headaches. Negative for syncope, weakness and numbness. Psychiatric/Behavioral: Negative for confusion.  Physical ExamED Triage Vitals [03/28/20 1138]BP: (!) 141/85Pulse: 71Pulse from  O2 sat: n/aResp: 18Temp: 98.3 ?F (36.8 ?C)Temp src: TemporalSpO2: 100 % BP (!) 163/85  - Pulse 72  - Temp 98.1 ?F (36.7 ?C) (Oral)  - Resp 18  - Wt (!) 192 kg (423 lb 4.5 oz)  - SpO2 100%  - BMI 68.32 kg/m? Physical ExamVitals signs and nursing note reviewed. Constitutional:     General: She is not in acute distress.   Appearance: She is well-developed. She is not ill-appearing or toxic-appearing. HENT:    Head: Normocephalic and atraumatic.    Right Ear: Tympanic membrane, ear canal and external ear normal.    Left Ear: Tympanic membrane, ear canal and external ear normal.    Nose:  Nose normal.    Mouth/Throat:    Mouth: Mucous membranes are moist.    Pharynx: Oropharynx is clear. Uvula midline. Eyes:    General:       Right eye: No discharge.       Left eye: No discharge.    Conjunctiva/sclera: Conjunctivae normal.    Pupils: Pupils are equal, round, and reactive to light.    Comments: No papilledema appreciated Neck:    Musculoskeletal: Normal range of motion and neck supple. Cardiovascular:    Rate and Rhythm: Normal rate and regular rhythm. Pulmonary:    Effort: Pulmonary effort is normal. No respiratory distress.    Breath sounds: Normal breath sounds. Skin:   General: Skin is warm and dry. Neurological:    Mental Status: She is alert.    Comments: CNII-XII intactTongue midlineNo VF defectSILT throughoutGrade 5/5 strength throughoutFinger-to-nose and heel-to-shin intactRapid alternating movements intactNegative Romberg and pronator driftSteady gait Psychiatric:       Mood and Affect: Mood normal.       Speech: Speech normal.       Behavior: Behavior normal.  ProceduresProcedures ED COURSEInterpreted by ED Provider: labsDiscuss the patient with other providers: yes (Dr. Rachell Cipro Reevaluation: JXB:[id number] HAIntracranial bleedCerebral venous sinus thrombosisPlan:Urine HCG, labs, IVF, pain control ordered by PITCT headCT venogram w/ IV contrastED COURSE:Urine HCG negativeNo significant lab abnormalities3:02 PMHeadache is currently 1/10, no focal neurologic deficits. Discussed performing LP to r/o SAH, risks/benefits dicussed, patient verbalizes understanding and declines. 5:00 PMSigned out to [facility] pending [place] and re-evaluation. V.Vasilia Dise PA-COn initial assessment, pt is resting comfortably, no complaints offered. CLINICAL INDICATION: Headaches x3 days. History of J&J COVID vaccine.?EXAM/TECHNIQUE: [place] venogram with contrast (100 cc Omnipaque 350). Protocol: Routine + coronal and sagittal reformatted images. Iterative reconstruction was utilized to minimize dose.?COMPARISON:: [place] 03/28/2020.?FINDINGS:?    VASCULAR?Visualized Internal Jugular Veins: Grossly patent. No discrete filling defects or stenosis.?Transverse/Sigmoid Sinuses: Grossly patent. No discrete filling defects or stenosis.?Straight/Inferior/Superior Sagittal Sinuses: Grossly patent. No discrete filling defects or stenosis.?Vein of [place]/Internal Cerebral Veins: Grossly patent. No discrete filling defects or stenosis.?     NON-VASCULAR?Brain Parenchyma: No mass, mass effect or midline shift or [place] evidence of an acute territorial infarct. Limited evaluation for hemorrhage without noncontrast images. No large hematoma.?Extra Axial Spaces: Age-appropriate. No acute or chronic collections. Indeterminate sellar/suprasellar mass, may measure up to 1.4 cm in maximal diameter, nonspecific with a differential that includes a macroadenoma, meningioma or craniopharyngioma, less likely an optic glioma, particularly in the absence of known diagnosis of neurofibromatosis, or a hypothalamic hamartoma, does not appear to be vascular, related to an underlying aneurysm?Ventricular System: Normal size and configuration.?Visualized Paranasal Sinuses: Minimal mucosal thickening. No air-fluid levels.?Bone: No acute or suspicious abnormalities.?   IMPRESSION:?No CTV evidence of definite filling defects/stenosis in dural venous sinuses.?Indeterminate sellar / suprasellar mass, 1.4 cm. ENT consult recommended.?Reported and signed by:  Charlean Sanfilippo, MDI spoke with on call ENT who recommended neurosurgery. We discussed case with on call neurosurgeon, they recommended outpt MRI on non-emergent basis, f/u with endocrinology. I wrote an ambulatory referral to [facility] endo, pt also has contact with [facility] pedi endo. PCP at [facility]. Pt agrees with plan for discharge, close f/u with endocrinology, PCP for next available appt. Strict return precautions given. Pt expressed understanding of instructions. All questions addressed and answered.Patient progress: stableClinical Impressions as of Mar 29 902 Nonintractable headache, unspecified chronicity pattern, unspecified headache type Suprasellar mass ---I reviewed the history and physical examination and discussed the management with  PA Maxine Glenn.  I agree with the findings and plan of care, with the following noted exceptions and additions based on my independent history and physical exam. 24 y.o. presents with to the ED for headache.  Patient is concerned given that her symptom onset was 1 day after she received the J and J COVID vaccine.  At time of my evaluation, patient is resting comfortably and states that her headache is a 1/10 on the pain scale.  Neuro intact.  No meningeal signs.  To me denied thunderclap quality of headache, pain was not maximal at onset.DDx considered includes:  Tension headache, migraine headache, low suspicion for venous thrombosis though this is the patient's primary concern in the setting of recent vaccine, SAH consideredPlan: [place] head/venogramLP discussed with extensive risk benefit discussion--LP declined by patientManagement decisions made amidst ongoing COVID-19 pandemic and take that reality into account.Koren Shiver, MD7:57 PMSigned out to incoming attg ED DispositionDischarge Maxine Glenn, PA04/17/21 0904 Lorin Picket, PA-C04/17/21 1348

## 2020-03-29 NOTE — ED Notes
9:57 PM Pt updated to results and she is aware of waiting for further critique of [place] Scan results per herself.PA Oswaldo Done texted for request of pain medication.

## 2020-04-12 ENCOUNTER — Encounter: Admit: 2020-04-12 | Payer: PRIVATE HEALTH INSURANCE | Attending: Adult Health | Primary: Adult Health

## 2020-04-12 DIAGNOSIS — G9389 Other specified disorders of brain: Secondary | ICD-10-CM

## 2020-04-15 ENCOUNTER — Encounter: Admit: 2020-04-15 | Payer: PRIVATE HEALTH INSURANCE | Attending: Adult Health | Primary: Adult Health

## 2020-04-15 DIAGNOSIS — G9389 Other specified disorders of brain: Secondary | ICD-10-CM

## 2020-04-28 ENCOUNTER — Encounter: Admit: 2020-04-28 | Payer: PRIVATE HEALTH INSURANCE | Attending: Adult Health | Primary: Adult Health

## 2020-04-28 DIAGNOSIS — G9389 Other specified disorders of brain: Secondary | ICD-10-CM

## 2020-04-29 ENCOUNTER — Ambulatory Visit
Admit: 2020-04-29 | Payer: PRIVATE HEALTH INSURANCE | Attending: Endocrinology, Diabetes & Metabolism | Primary: Adult Health

## 2020-05-06 ENCOUNTER — Telehealth
Admit: 2020-05-06 | Payer: PRIVATE HEALTH INSURANCE | Attending: Endocrinology, Diabetes & Metabolism | Primary: Adult Health

## 2020-05-06 ENCOUNTER — Ambulatory Visit: Admit: 2020-05-06 | Payer: MEDICAID | Attending: Endocrinology, Diabetes & Metabolism | Primary: Adult Health

## 2020-05-06 DIAGNOSIS — E236 Other disorders of pituitary gland: Secondary | ICD-10-CM

## 2020-05-06 NOTE — Patient Instructions
1) Get bloodwork and urine tests done2) Follow-up with opthalmology 3) Follow-up visit in 6 weeks

## 2020-05-06 NOTE — Telephone Encounter
Lm to make sure she does lab tests and urine tests, looking at the lesion it will probably need to be removed so I would like to get things done as soon as possible.

## 2020-05-06 NOTE — Progress Notes
Patient Name: Onnika Siebel of Birth: October 15, 1997History of Present Illness:Ms. 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 Patient presented to the ED on 03/28/20 with complaints of headache for 3 days duration which was associated with . photophobia, phonophobia, nausea, and dizziness. Patient denied trauma, visual disturbance, numbness, weakness, confusion, neck pain/stiffness at that time. CTV head showed Indeterminate sellar / suprasellar mass, 1.4 cm. ENT was contacted who recommended neurosurgery. Case was discussed with on call neurosurgeon who recommended outpt MRI on non-emergent basis, f/u with endocrinology. Mri shows 1.8 cm sellar/supra-sellar mass. However Mri images have not been uploaded to Epic yet.Since her ED visit, her headache has slightly gotten worse. Patient endorses slightly worsening of vision in the past few weeks however she denied problems with her field of vision. Patient has had weight gain since she was diagnosed with PCOS in 2011. Patient endorses increased sweating, tremors and mood swings in the past few weeks. Patient also endorsed having an increase in shoe sizes around 2 weeks back. Patient is currently on Minera IUD since 2 years back and usually has periods every 3 months. Patient also endorses increased water intake and nocturia. However she hasnt had a period since Jan. She denies breast discharge, heat/cold intolerance, skin rash, diarrhea. Patient was taking spironolactone for hirsutism but stopped taking several weeks ago since her headaches started. Her last hba1c was 5.3 back in 2020.Past Medical History: She  has a past medical history of BMI 60.0-69.9, adult (HC Code), Cancer (HC Code) (2012), Depression (09/20/2012), OSA (obstructive sleep apnea) (10/17/2014), and PCOS (polycystic ovarian syndrome) (09/20/2012). Past Surgical History: She  has a past surgical history that includes Ovarian cyst removal (Right, 2018); Knee arthroscopy (Right, 02/2017); Abdominal surgery (2012); Dilation and curettage of uterus (04/2016); and Dental surgery.Current Outpatient Medications: ?  desogestreL-ethinyl estradioL, 1 tablet, Oral, Daily (Patient not taking: Reported on 12/03/2019)?  guaiFENesin, 100-200 mg, Oral, Q4H PRN (Patient not taking: Reported on 12/03/2019)?  ibuprofen, 600 mg, Oral, Q6H PRN (Patient not taking: Reported on 12/03/2019)?  ibuprofen, 600 mg, Oral, Q6H PRN?  multivitamin, 1 tablet, Oral, Daily (Patient not taking: Reported on 11/30/2018)?  SPIRONOLACTONE ORAL, Take by mouth.Allergies: She has No Known Allergies.Social History:She  reports no history of drug use.She  reports no history of alcohol use.She  reports that she has never smoked. She has never used smokeless tobacco.Review of SystemsConstitutional  Neg Endocrine  Neg HENT   Neg GU  Neg Eyes  Neg Allergy/Immuno  Neg Respiratory  Neg Neurological  Neg Cardiovascular  Neg Hematologic  Neg Gastrointestinal  Neg Psychiatric  Neg There were no vitals taken for this visit.Physical Exam:Physical ExamConstitutional:     Appearance: Normal appearance. She is obese. HENT:    Head: Normocephalic. Eyes:    General: Vision grossly intact. No visual field deficit.   Extraocular Movements: Extraocular movements intact. Neck:    Thyroid: No thyroid mass. Cardiovascular:    Rate and Rhythm: Normal rate and regular rhythm.    Pulses: Normal pulses.    Heart sounds: Normal heart sounds. Pulmonary:    Breath sounds: Normal breath sounds. Abdominal:    General: Bowel sounds are normal. Musculoskeletal: Normal range of motion. Lymphadenopathy:    Cervical: No cervical adenopathy. Skin:   General: Skin is dry. Neurological:    General: No focal deficit present. Mental Status: She is alert. MRI indicating a 1.3 x 1.8 x 1.3  centimeter sellar/suprasellar mass, with a central nonenhancing component measuring 0.8 x 1.1 x 1.4 centimeter, there is displacement of the pituitary anteriorly and superiorly.Assessment/Plan: Ms. 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 Patient has suprasellar/sellar mass measuring 1.8 cm. Differentials include pituitary macroadenoma, Craniopharyngioma, meningioma. Patient reports increased blurry vision. However, physical exam did not reveal any gross visual field deficits. However, patient would benefit from a formal visual field study from an ophthalmologist. If patient has visual field defects 2/2 mass, patient would benefit from surgical removal of mass. Patient's h/o increasing shoe size, increased sweating, headaches, and sleep apnea raises concern for possible acromegaly. Patient has h/o obesity which could be secondary to Cushing disease and hypothyroidism but in the context of PCOS, it is difficult to determine clinically. Patient has menstrual irregularities but in the context of her IUD use and PCOS, it is difficult to clinically determine if she has hypogonadotrophic hypogonadism. Patient also has increased h/o increased water intake and nocturia increasing suspicion of DI. Patient would benefit from checking following levels:  Prolactin, TSH, FSH, LH, 24 hour urinary cortisol, random cortisol, IGF-1 , estrogen, serum and urine osmolality, BMP. Once the following labs are back, patient can be evaluated for whether patient would benefit from surgical removal of the intra-cranial massPlan:- Referral to ophthalmology for fomal visual field testing - Follow-up MRI images - Prolactin, TSH, FSH, LH, 24 hour urinary cortisol, random cortisol, IGF-1 , estrogen, serum and urine osmolality, BMP. - Follow-up visit in 6 weeks Electronically Signed by Jacklyn Shell, MD, May 25, 2021I have seen and examined the patient and have reviewed the case with Dr. Burnett Corrente. I agree with the assessment and plan as outlined and have the following additional comments. A 24 year old woman with a history of morbid obesity who presented with recent headaches, some difficulty in her vision but no acute visual deficits, and a sellar mass discovered on imaging.She has a history of excess weight gain developing in her teenage years, out of proportion to the rest of her family members, she was diagnosed with PCOS and has hirsutism, a history of irregular menses, but after placement of an IUD it is somewhat difficult to have a clear history recently.  She does describe some diminished vision but no visual field cuts, she has headaches that began recently and have persisted, she gets up at night to urinate about twice and reports herself feeling thirsty often, she does not have any breast discharge, intending reason in shoe size from 11 to 12 as an adult recently, she has a history of sleep apnea, reported some tremors and sweating at times, constipation, and continued weight gain.  No thinning of the skin but she reports easy bruising and some loss in muscle strength.On physical exam her visual fields were grossly intact as were her extraocular movements, she had acanthosis nigricans, no tremor, palms were dry, she had a ring which fit loosely and she reported that it was looser in the past, but she did not have hand or foot edema.  Skin was of normal thickness, no violaceous striae, no prominent supraclavicular fullness or dorsocervical fullness though she did striae from hyperpigmentation and skin expansion.We reviewed this with the radiologist today and it looks more like a cystic structure, and it is my impression that she will need to have this surgically resected, will need to do an evaluation for hormonal excess and deficiency.  Not clear if the urination overnight and thirst represent diabetes insipidus, but her pituitary stalk is certainly being pressed upwards so I would not be surprised if she at least had hyperprolactinemia from that alone.  Will plan for visual field testing and then neurosurgical evaluation, but will need to at least get the prolactin and other labs back 1st.Sachin Pricilla Riffle, MD

## 2020-05-07 DIAGNOSIS — G9389 Other specified disorders of brain: Secondary | ICD-10-CM

## 2020-05-08 ENCOUNTER — Encounter: Admit: 2020-05-08 | Payer: PRIVATE HEALTH INSURANCE | Attending: Neurological Surgery | Primary: Adult Health

## 2020-05-08 ENCOUNTER — Inpatient Hospital Stay: Admit: 2020-05-08 | Discharge: 2020-05-08 | Payer: MEDICAID | Primary: Adult Health

## 2020-05-11 LAB — IGF I, LC/MS     (Q)
IGF I, ECL: 156 ng/mL (ref 83–456)
Z SCORE (FEMALE): -0.7 {STDV} (ref ?–2.0)

## 2020-05-11 LAB — BASIC METABOLIC PANEL
BLOOD UREA NITROGEN: 13 mg/dL (ref 7–25)
CALCIUM: 9.5 mg/dL (ref 8.6–10.2)
CHLORIDE: 105 mmol/L (ref 98–110)
CO2: 26 mmol/L (ref 20–32)
CREATININE: 0.71 mg/dL (ref 0.50–1.10)
EGFR (AFR AMER): 140 mL/min/{1.73_m2} (ref >=60)
EGFR (NON AFRICAN AMERICAN): 121 mL/min/{1.73_m2} (ref >=60)
GLUCOSE: 77 mg/dL (ref 65–99)
POTASSIUM: 4.4 mmol/L (ref 3.5–5.3)
SODIUM: 139 mmol/L (ref 135–146)

## 2020-05-11 LAB — TEST AUTHORIZATION     (LMW Q)

## 2020-05-11 LAB — GROWTH HORMONE: GROWTH HORMONE: 0.6 ng/mL (ref <=7.1)

## 2020-05-11 LAB — ESTRADIOL: ESTRADIOL LEVEL: 194 pg/mL

## 2020-05-11 LAB — PROLACTIN, DILUTION STUDY     (BH GH Q): PROLACTIN, UNDILUTED: 13.4 ng/mL

## 2020-05-11 LAB — CORTISOL: CORTISOL TOTAL: 10.6 ug/dL

## 2020-05-11 LAB — ACTH: ACTH, PLASMA: 12 pg/mL (ref 6–50)

## 2020-05-11 LAB — LUTEINIZING HORMONE: LH: 8.4 m[IU]/mL

## 2020-05-11 LAB — OSMOLALITY, URINE, RANDOM: OSMOLALITY URINE: 904 mosm/kg (ref 50–1200)

## 2020-05-11 LAB — OSMOLALITY: OSMOLALITY: 287 mosm/kg (ref 278–305)

## 2020-05-11 LAB — T4, FREE: FREE T4: 1.1 ng/dL (ref 0.8–1.8)

## 2020-05-11 LAB — PROLACTIN: PROLACTIN: 14 ng/mL

## 2020-05-13 ENCOUNTER — Encounter: Admit: 2020-05-13 | Payer: PRIVATE HEALTH INSURANCE | Attending: Neurological Surgery | Primary: Adult Health

## 2020-05-13 ENCOUNTER — Ambulatory Visit: Admit: 2020-05-13 | Payer: MEDICAID | Attending: Neurological Surgery | Primary: Adult Health

## 2020-05-13 DIAGNOSIS — Z6841 Body Mass Index (BMI) 40.0 and over, adult: Secondary | ICD-10-CM

## 2020-05-13 DIAGNOSIS — C801 Malignant (primary) neoplasm, unspecified: Secondary | ICD-10-CM

## 2020-05-13 DIAGNOSIS — F32A Depression: Secondary | ICD-10-CM

## 2020-05-13 DIAGNOSIS — G4733 Obstructive sleep apnea (adult) (pediatric): Secondary | ICD-10-CM

## 2020-05-13 DIAGNOSIS — G9389 Other specified disorders of brain: Secondary | ICD-10-CM

## 2020-05-13 DIAGNOSIS — E282 Polycystic ovarian syndrome: Secondary | ICD-10-CM

## 2020-05-14 LAB — CORTISOL FREE, TIMED URINE W/CREATININE
CORTISOL, FREE, URINE: 104.1 ug/(24.h) — ABNORMAL HIGH (ref 4.0–50.0)
CORTISOL, FREE, URINE: 43.9 ug/g{creat} — ABNORMAL HIGH
CREATININE, URINE: 2.37 g/(24.h) — ABNORMAL HIGH (ref 0.50–2.15)
TOTAL VOLUME (85993218): 2500 mL

## 2020-05-15 ENCOUNTER — Telehealth
Admit: 2020-05-15 | Payer: PRIVATE HEALTH INSURANCE | Attending: Endocrinology, Diabetes & Metabolism | Primary: Adult Health

## 2020-05-15 DIAGNOSIS — E236 Other disorders of pituitary gland: Secondary | ICD-10-CM

## 2020-05-15 NOTE — Telephone Encounter
Spoke with patient, urine cortisol mildly elevated, would like to repeat the collection, and do late night salivary cortisol, and when that is all done I will do a dexamethasone suppression test.

## 2020-05-19 NOTE — Progress Notes
Patient Name: Gina Aguilar of Birth: [birth year]Chief Complaint:  Headaches, incidental find of Pituitary massReferred by:  Dr. Pricilla Riffle (endo)History of Present Illness:Gina Aguilar is a 24 y.o., right handed female, who presents with a newly diagnosed sellar mass. Past Medical History: She  has a past medical history of BMI 60.0-69.9, adult (HC Code), Cancer (HC Code) (2012), Depression (09/20/2012), OSA (obstructive sleep apnea) (10/17/2014), and PCOS (polycystic ovarian syndrome) (09/20/2012). Past Surgical History: She  has a past surgical history that includes Ovarian cyst removal (Right, 2018); Knee arthroscopy (Right, 02/2017); Abdominal surgery (2012); Dilation and curettage of uterus (04/2016); and Dental surgery.Current Outpatient Medications: ?  ibuprofen, 600 mg, Oral, Q6H PRN (Patient not taking: Reported on 05/06/2020)?  SPIRONOLACTONE ORAL, Take by mouth. Allergies: She has No Known Allergies.Social History:She  reports no history of drug use.She  reports no history of alcohol use.She  reports that she has never smoked. She has never used smokeless tobacco. Review of Systems Constitutional: Positive for fatigue (works full time and schooling). HENT: Positive for congestion, postnasal drip, sinus pressure and sinus pain.  Eyes: Negative.  Respiratory: Negative.  Cardiovascular: Negative.  Gastrointestinal: Negative.  Endocrine: Negative.  Genitourinary: Negative.  Musculoskeletal: Negative.       Left knee weaknessSometimes right knee Allergic/Immunologic: Negative.  Neurological: Positive for headaches. Hematological: Negative.  Psychiatric/Behavioral: Positive for sleep disturbance. I have reviewed the review of systems, as noted by the clinical support staff.BP 118/75 (Site: r a, Position: Sitting, Cuff Size: Large)  - Pulse 79  - Temp 97.4 ?F (36.3 ?C) (Temporal)  - Ht 5' 6 (1.676 m)  - Wt (!) 194.2 kg  - SpO2 97% - BMI 69.11 kg/m?  Exam:Awake, alert, oriented x3EOMI, PERRLAVF and VA is grossly within N limitsFace symmetricNo drift Imaging: Sellar suprasellar lesion likley representing a RCC or a cystic adenoma Assessment/Plan:  Patient has a newly diagnosed pituitary lesion. Endocrine workup is currently being done.Will plan to obtain a neurooptho eval and repeat MRI in 3 months,Will d/w Dr Pricilla Riffle regarding the final results of the endo workup.I spent 60 minutes including review and analysis of chart, data and imaging as well as counseling and coordination of care.Electronically signed ZO:XWRUE Lenise Herald, MD

## 2020-05-20 ENCOUNTER — Encounter
Admit: 2020-05-20 | Payer: PRIVATE HEALTH INSURANCE | Attending: Endocrinology, Diabetes & Metabolism | Primary: Adult Health

## 2020-05-20 DIAGNOSIS — E236 Other disorders of pituitary gland: Secondary | ICD-10-CM

## 2020-05-21 ENCOUNTER — Encounter
Admit: 2020-05-21 | Payer: PRIVATE HEALTH INSURANCE | Attending: Endocrinology, Diabetes & Metabolism | Primary: Adult Health

## 2020-05-31 LAB — CORTISOL, LC/MS/MS, SALIVA, 3 SAMPLES (Q)
CORTISOL, SALIVA SAMPLE 1: 0.54 ug/dL
CORTISOL, SALIVA SAMPLE 2: 0.15 ug/dL
CORTISOL, SALIVA SAMPLE 3: 0.13 ug/dL
DRAW DATE 1: 60921
DRAW DATE 2: 61021
DRAW DATE 3: 61121
DRAW TIME 2: 0.13 ng/dL

## 2020-05-31 LAB — CORTISOL FREE, TIMED URINE W/CREATININE
CORTISOL, FREE, URINE: 103.1 ug/(24.h) — ABNORMAL HIGH (ref 4.0–50.0)
CORTISOL, FREE, URINE: 40.6 ug/g{creat}
CREATININE, URINE: 2.54 g/(24.h) — ABNORMAL HIGH (ref 0.50–2.15)
TOTAL VOLUME (85993218): 2700 mL

## 2020-06-06 ENCOUNTER — Telehealth
Admit: 2020-06-06 | Payer: PRIVATE HEALTH INSURANCE | Attending: Endocrinology, Diabetes & Metabolism | Primary: Adult Health

## 2020-06-06 DIAGNOSIS — E236 Other disorders of pituitary gland: Secondary | ICD-10-CM

## 2020-06-06 NOTE — Telephone Encounter
Spoke with patient, she did the salivary cortisol in the morning, and not prior to bedtime.  I asked her to do it again, and then do a dex suppression test, she reports worsening headaches and some rash on her scalp.

## 2020-06-13 ENCOUNTER — Telehealth
Admit: 2020-06-13 | Payer: PRIVATE HEALTH INSURANCE | Attending: Endocrinology, Diabetes & Metabolism | Primary: Adult Health

## 2020-06-13 NOTE — Telephone Encounter
Per last note from providerSpoke with patient, she did the salivary cortisol in the morning, and not prior to bedtime.  I asked her to do it again, and then do a dex suppression test, she reports worsening headaches and some rash on her scalpWill confirm POC-

## 2020-06-13 NOTE — Telephone Encounter
Patient states a medication had to be sent to pharmacy prior to a test. Not sure if this is the test dex suppression test. She states she did the Cortisol test.

## 2020-06-13 NOTE — Telephone Encounter
Yes, she needs to do three bedtime samples of saliva first. Each on a different day. If she takes that other medicine it will ruin these tests, so I will prescribe it when she's done. Thanks!

## 2020-06-13 NOTE — Telephone Encounter
Spoke with ptShe states she did the  3 saliva tests at bedtime and dropped then off 6/29 Routing back to provider

## 2020-06-17 ENCOUNTER — Ambulatory Visit: Admit: 2020-06-17 | Payer: MEDICAID | Attending: Endocrinology, Diabetes & Metabolism | Primary: Adult Health

## 2020-06-17 DIAGNOSIS — E236 Other disorders of pituitary gland: Secondary | ICD-10-CM

## 2020-06-17 DIAGNOSIS — R519 Headache, unspecified: Secondary | ICD-10-CM

## 2020-06-17 DIAGNOSIS — G8929 Other chronic pain: Secondary | ICD-10-CM

## 2020-06-17 LAB — CORTISOL, LC/MS/MS, SALIVA, 3 SAMPLES (Q)
CORTISOL, SALIVA SAMPLE 1: <0.03 ug/dL
CORTISOL, SALIVA SAMPLE 2: <0.03 mcg/dL — ABNORMAL LOW
CORTISOL, SALIVA SAMPLE 3: <0.03 ug/dL

## 2020-06-17 MED ORDER — DEXAMETHASONE 1 MG TABLET
1 mg | ORAL_TABLET | Freq: Once | ORAL | 1 refills | Status: AC
Start: 2020-06-17 — End: ?

## 2020-06-17 NOTE — Patient Instructions
Get Dexamehtasone suppression test as advised.1 mg Dexamethasone at 11 PM, check next morning labs at 8 AM.Follow up in 3 weeks.

## 2020-06-17 NOTE — Telephone Encounter
Excellent, thanks. 

## 2020-06-19 ENCOUNTER — Encounter
Admit: 2020-06-19 | Payer: PRIVATE HEALTH INSURANCE | Attending: Endocrinology, Diabetes & Metabolism | Primary: Adult Health

## 2020-06-19 DIAGNOSIS — G9389 Other specified disorders of brain: Secondary | ICD-10-CM

## 2020-06-19 DIAGNOSIS — E236 Other disorders of pituitary gland: Secondary | ICD-10-CM

## 2020-06-19 NOTE — Progress Notes
[facility] HealthAmbulatory Progress NoteEndocrinologyDate: 7/6/2021[name] is a 24 y.o. 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 Has some blurring of vision iso refractory errors requiring glasses.  Scheduled for perimetry/visual field charting. Based on symptoms there was concern for acromegaly, cushing's disease, hypothyroidism, hyperprolactinemia, or hypogonadotropic hypogonadism.Labs were ordered, based on results, there is increased excretion of urine free cortisol. Saliva for 11 PM cortisol, 3 samples, in process (Previous samples mistakenly taken in the morning).Planned for DST.Has a headache for 2 days, left sided, without any acute visual disturbances. Initially 7/10, now 3/10. No nausea or vomiting.Review of history, medications and allergies: Past medical history Past surgical history Past Medical History: Diagnosis Date ? BMI 60.0-69.9, adult (HC Code)  ? Cancer [facility] Code) 2012  Right ovarian granulosa cell carcinoma ? Depression 09/20/2012 ? OSA (obstructive sleep apnea) 10/17/2014  CPAP ? PCOS (polycystic ovarian syndrome) 09/20/2012  Past Surgical History: Procedure Laterality Date ? 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 DENTAL SURGERY   ? DILATION AND CURETTAGE OF UTERUS  04/2016  Dr Earnest Conroy ([facility]) ? KNEE ARTHROSCOPY Right 02/2017  acl repair and meniscal repair ? OVARIAN CYST REMOVAL Right 2018  Dr Elenor Quinones- benign  Social history Family history Social History Tobacco Use ? Smoking status: Never Smoker ? Smokeless tobacco: Never Used Substance Use Topics ? Alcohol use: No Social History Substance and Sexual Activity Alcohol Use No  Social History Substance and Sexual Activity Drug Use No  Occupational History ? Not on file  Marital status: Single Family History Problem Relation Age of Onset ? Hypertension Mother  ? Stroke Mother 65      HTN related  Health maintenance Immunization History Administered Date(s) Administered Comments ? COVID-19 Vaccine - JANSSEN (J&J) 03/18/2020    Medications Current Outpatient Medications on File Prior to Visit Medication Sig Dispense Refill ? ibuprofen (ADVIL,MOTRIN) 600 mg tablet Take 1 tablet (600 mg total) by mouth every 6 (six) hours as needed. (Patient not taking: Reported on 05/06/2020) 30 tablet 0 ? SPIRONOLACTONE ORAL Take by mouth.   No current facility-administered medications on file prior to visit.    Allergies No Known Allergies Patient goals:  Goals  None  Review of systems:  Unremarkable except as mentioned above.Vital signs: Vitals:  06/17/20 1051 Temp: 98.3 ?F (36.8 ?C) Physical examination: General: Awake and alert Obese habitusHEENT: PERRLA, anicteric, oral mucosa moistNeck: supple, JVP 1+5 cm H2O, no JVD, no bruits.Chest: CTA B/L, no W/R/C.Heart: RRR, S1/S2,  no S3/S4,  no M/G/R.Abdominal: BS normoactive, soft, ND, NT, no hepatosplenomegaly.Skin/Extremities: no rash, radial pulse 2+ B/L, DP 2+ B/L, capillary refill 2, no LE edema. Striae presentNeurological: CN II-XII grossly intact, power 5/5 throughout, no focal deficits.Laboratory studies: Complete Blood Count:Lab Results Component Value Date  WBC 11.6 (H) 03/28/2020  WBC 8.9 01/06/2016  RBC 4.5 03/28/2020  RBC 4.26 01/06/2016  HGB 13.4 03/28/2020  HGB 13.4 04/27/2019  HCT 41.3 03/28/2020  HCT 38.8 04/27/2019  MCV 92.4 03/28/2020  MCV 88.5 01/06/2016  MCH 30.0 03/28/2020  MCH 28.8 01/06/2016  MCHC 32.4 (L) 03/28/2020 MCHC 32.6 01/06/2016  RDW 14.2 04/27/2019  PLT 450 03/28/2020  PLT 446 (H) 01/06/2016  MPV 9.2 03/28/2020  MPV 6.8 01/06/2016 Comprehensive Metabolic Panel:Lab Results Component Value Date  GLU 77  05/06/2020  GLU 101 (H) 03/28/2020  BUN 13 05/06/2020  BUN 12 03/28/2020  CREATININE 0.71 05/06/2020  CREATININE 0.62 03/28/2020  NA 139 05/06/2020  NA 140 03/28/2020  K 4.4 05/06/2020  K 4.0 03/28/2020  CL 105 05/06/2020  CL 105 03/28/2020  CO2 26 05/06/2020  CO2 26 03/28/2020  ALBUMIN 4.4 03/28/2020  PROT 7.8 03/28/2020  BILITOT 0.4 03/28/2020  ALKPHOS 112 03/28/2020  ALT 16 03/28/2020  ALT 12 04/07/2015  GLOB 3.4 03/28/2020  CALCIUM 9.5 05/06/2020  CALCIUM 9.5 03/28/2020 Metabolic Panel:Hemoglobin A1c Date Value 04/27/2019 5.3 % of total Hgb 03/20/2016 5.7 % of total Hgb (H) 01/06/2016 5.8 % 04/07/2015 5.7 %  Thyroid Stimulating Hormone (U/mL) Date Value 01/06/2016 0.766 TSH (uU/mL) Date Value 04/07/2015 cancelled 04/07/2015 1.39  Free T4 (ng/dL) Date Value 16/09/9603 1.1  LDL Calculated (mg/dL) Date Value 54/08/8118 77 04/07/2015 67 LDL Cholesterol (mg/dL (calc)) Date Value 14/78/2956 78 LDL Direct (mg/dL) Date Value 21/30/8657 94 Direct LDL (mg/dL) Date Value 84/69/6295 93  HDL (mg/dL) Date Value 28/41/3244 41 (L) 03/20/2016 37 01/06/2016 40 04/07/2015 39 (L) 08/26/2014 36  No components found for: T3 Cholesterol (mg/dL) Date Value 12/15/7251 132 04/07/2015 123 Cholesterol, Total (mg/dL) Date Value 66/44/0347 152 03/20/2016 138 08/26/2014 128  Triglycerides (mg/dL) Date Value 42/59/5638 118 03/20/2016 57 01/06/2016 73 04/07/2015 87 08/26/2014 70   Diagnostic Studies:There were no new diagnostic studies of note. Problem list: Patient Active Problem List  Diagnosis Date Noted ? Abnormal hair growth from preauricular area towards lateral cheekbone 04/13/2019 ? Abnormal uterine bleeding (AUB) 07/12/2017 ? S/P ACL reconstruction 05/25/2017 ? S/P ovarian cystectomy 07/13/2016 ? Problem with vascular access 06/06/2016 ? Ovarian cyst, right 03/30/2016 ? Obstructive sleep apnea 03/17/2016 ? Endometrial hyperplasia without atypia, simple 01/06/2016 ? Morbid obesity with body mass index of 60.0-69.9 in adult (HC Code) 08/06/2014   BMI 60. ? PCOS (polycystic ovarian syndrome) 09/20/2012 ? Juvenile granulosa cell tumor 04/19/2011   Exploratory laparotomy, right ovarian cystectomy.04/20/2011 Assessment & plan: 24 y.o. female with h/o morbid obesity, pituitary mass, increased urine free cortisol presented for follw up.She is currently planned for 1 mg DST.- To take 1 mg dexamethasone at 11 PM, check 8 AM serum dexamethasone and cortisol levels.- Check visual fields on 06/25/20 as scheduled.- Neurosurgery review for resection of tumor.# Follow-up:- Return in about 3 weeks (around 07/08/2020). Discussed with Dr Pricilla Riffle. Please refer addendum.Signed:Joyce Heitman MDPGY - 17/6/2021I have seen and examined the patient and have reviewed the case with Dr. Hassie Bruce. I agree with the assessment and plan as outlined and have the following additional comments. A 24 year old woman with history of morbid obesity who noted the onset of headaches recently and has been found to have a sellar mass on imaging.I went over her headaches a bit more specifically.  She reports that they started sometime in April/May, but had a history of headaches for years, but these became worse, so bad that sometimes she gets nausea, they occur on the left side mostly, and since the last month or 2 they have gotten progressively worse and are not help with conservative treatments like ibuprofen or Tylenol.  She feels something really needs to be done about this and would favor surgery if it could help her.She does not have any overt features of Cushing's disease but she has had 2 urinary cortisol levels that have been elevated, however, her late-night salivary cortisol values were undetectable, and she had an appropriate response to dexamethasone.At this point I do not see any evidence of hyperfunction, but  given the progressive headaches, the size of the lesion, and her age I think resection would be reasonable, but after initial consultation by neuro surgery the thought was to repeat an MRI in 3 months which would be early August.  I did explain the risks of pituitary hypofunction that could occur in the setting of pituitary surgery.If this is a Rathke's cleft cyst, in the absence of symptoms it would be important toShe has a visual field exam scheduled for July 14th and I think we should get another MRI sooner, and I would like to have her see Neurology just to make sure we are not missing a different headache syndrome.  If there is some other explanation then we can go ahead and treated and perhaps observe further, but if there is a visual field impairment, and increase in size on MRI, or no better explanation or treatment that would satisfy things, then surgery would become more compelling.Pollie Meyer, MD

## 2020-06-23 ENCOUNTER — Encounter
Admit: 2020-06-23 | Payer: PRIVATE HEALTH INSURANCE | Attending: Endocrinology, Diabetes & Metabolism | Primary: Adult Health

## 2020-06-25 ENCOUNTER — Ambulatory Visit: Admit: 2020-06-25 | Payer: MEDICAID | Attending: Neurology | Primary: Adult Health

## 2020-06-25 ENCOUNTER — Encounter: Admit: 2020-06-25 | Payer: PRIVATE HEALTH INSURANCE | Attending: Neurology | Primary: Adult Health

## 2020-06-25 ENCOUNTER — Ambulatory Visit: Admit: 2020-06-25 | Payer: MEDICAID | Primary: Adult Health

## 2020-06-25 DIAGNOSIS — Z6841 Body Mass Index (BMI) 40.0 and over, adult: Secondary | ICD-10-CM

## 2020-06-25 DIAGNOSIS — E282 Polycystic ovarian syndrome: Secondary | ICD-10-CM

## 2020-06-25 DIAGNOSIS — G4733 Obstructive sleep apnea (adult) (pediatric): Secondary | ICD-10-CM

## 2020-06-25 DIAGNOSIS — R930 Abnormal findings on diagnostic imaging of skull and head, not elsewhere classified: Secondary | ICD-10-CM

## 2020-06-25 DIAGNOSIS — H538 Other visual disturbances: Secondary | ICD-10-CM

## 2020-06-25 DIAGNOSIS — F32A Depression: Secondary | ICD-10-CM

## 2020-06-25 DIAGNOSIS — C801 Malignant (primary) neoplasm, unspecified: Secondary | ICD-10-CM

## 2020-06-25 DIAGNOSIS — G43909 Migraine, unspecified, not intractable, without status migrainosus: Secondary | ICD-10-CM

## 2020-06-25 MED ORDER — TOPIRAMATE 25 MG TABLET
25 mg | ORAL_TABLET | Freq: Every evening | ORAL | 6 refills | Status: AC
Start: 2020-06-25 — End: 2020-12-31

## 2020-06-25 MED ORDER — SUMATRIPTAN 100 MG TABLET
100 mg | ORAL_TABLET | Freq: Once | ORAL | 4 refills | Status: AC
Start: 2020-06-25 — End: ?

## 2020-06-26 LAB — DEXAMETHASONE: DEXAMETHASONE, SERUM: 211 ng/dL

## 2020-06-26 LAB — CORTISOL: CORTISOL TOTAL: 0.8 ug/dL — ABNORMAL LOW

## 2020-06-28 ENCOUNTER — Encounter: Admit: 2020-06-28 | Payer: PRIVATE HEALTH INSURANCE | Attending: Neurology | Primary: Adult Health

## 2020-06-28 DIAGNOSIS — E282 Polycystic ovarian syndrome: Secondary | ICD-10-CM

## 2020-06-28 DIAGNOSIS — C801 Malignant (primary) neoplasm, unspecified: Secondary | ICD-10-CM

## 2020-06-28 DIAGNOSIS — F32A Depression: Secondary | ICD-10-CM

## 2020-06-28 DIAGNOSIS — Z6841 Body Mass Index (BMI) 40.0 and over, adult: Secondary | ICD-10-CM

## 2020-06-28 DIAGNOSIS — G4733 Obstructive sleep apnea (adult) (pediatric): Secondary | ICD-10-CM

## 2020-06-29 NOTE — Progress Notes
Thank you very much for asking me to see Gina Aguilar in neuro-ophthalmology consultation at the [facility]. As you know, she is a 24 y.o. 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 Ocular history is notable for some blurred vision and refractory error requiring glasses. She received the J&J COVID-19 vaccine on 03-18-20. On 03-28-20, she presented to the ED for her headaches that had been worsening over the past several weeks. 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 In June, seen by Dr. Jani Files (Neurosurgery). No surgical interventions have been planned at this time. You requested a neuro-ophthalmology consultation. Today, she is concerned about her longstanding headaches and the possibility of vision loss. Her last headache was yesterday. She describes light sensitivity and noise sensitivity with her headaches. They do not resolve after sleep. She was diagnosed with sleep apnea in high school. She states that she never started therapy because of difficulty scheduling a repeat sleep study while away for school in [place]. She had plans to undergo weight loss surgery that were disrupted due to COVID. Her last eye examination was earlier this year in [place]. She began wearing glasses for distance vision in 2019. Her PCP is APRN Enriqueta Shutter. ExaminationOn examination today, she was alert, oriented and appropriate. Her blood pressure is 127/74 and her pulse is 71.  Corrected distance visual acuity was 20/20 in the right eye and 20/25 in the left eye. Uncorrected near visual acuity was J1+ in the right eye and J1+ in the left eye.. Slit lamp examination performed: YesDirect ocular fundus examination performed: YesIndirect ocular fundus examination performed: YesNotable Examination findings-- pupils are 6 mm, symmetric, and briskly reactive to light-- normal eye movements-- normal right and left optic discsTesting obtained in officeHumphrey visual fields performed on the 24-2 SITA Standard program had acceptable reliability indices and were full in both eyes.Optical Coherence Tomography (OCT) of the peripapillary retinal nerve fiber layers showed average thicknesses of 101 microns for the right eye and 108 microns for the left eye. Temporal sector thicknesses were 72 microns for the right eye and 72 microns for the left eye. Optical Coherence Tomography (OCT) of the maculae showed normal architecture of the inner and outer retinal segments in both eyes.Imaging reviewedI personally reviewed the MRI of the brain with and without contrast from Apr 24, 2020 which showed the enlarged pituitary.ImpressionIn the context of a headache workup a sellar mass was found.  She has been evaluated by an endocrinologist and a neurosurgeon.  I discussed with her today that the visual function was completely normal.  Visual fields were normal.Diagnosis(es):1. Migraine2. Pituitary mass3. Obstructive Sleep Apnea4. Obesity Recommendations:--- Start topiramate 100 mg nightly--- Sumatriptan 100 mg PRN for headache abortionFollow up plan: She has an appointment with Dr. Lonia Mad on September 30, 2020 for neurology follow-up.  At this time I do not need to see [name] on a routine basis in my neuro-ophthalmology clinic, although I certainly remain available as the need arises.------------------------------------------------------------------------------------------------------------------------------She has a past medical history of BMI 60.0-69.9, adult (HC Code), Cancer (HC Code) (2012), Depression (09/20/2012), OSA (obstructive sleep apnea) (10/17/2014), and PCOS (polycystic ovarian syndrome) (09/20/2012). She has a past surgical history that includes Ovarian cyst removal (Right, 2018); Knee arthroscopy (Right, 02/2017); Abdominal surgery (2012); Dilation and curettage of uterus (04/2016); and Dental surgery. Current medications include ibuprofen,SPIRONOLACTONE ORAL,SUMAtriptan,topiramate She has No Known Allergies. She reports  that she has never smoked. She has never used smokeless tobacco. Her family history includes Hypertension in her mother; Stroke (age of onset: 38) in her mother. Full review of systems (constitutional, eyes, cardiovascular, respiratory, neurological, HENT, skin, musculoskeletal, gastrointestinal, genitourinary, endocrine, psychiatric, heme/lymph and allergy/immunology) was performed and positive for headache. Otherwise negative.------------------------------------------------------------------------------------------------------------------------------Scribed for Hebert Soho, MD by Prudencio Burly, medical scribe July 14, 2021The documentation recorded by the scribe accurately reflects the services I personally performed and the decisions made by me. I reviewed and confirmed all material entered and/or pre-charted by the scribe.Electronically Signed by Prudencio Burly, June 25, 2020

## 2020-07-02 ENCOUNTER — Encounter: Admit: 2020-07-02 | Payer: PRIVATE HEALTH INSURANCE | Attending: Gynecologic Oncology | Primary: Adult Health

## 2020-07-07 ENCOUNTER — Telehealth
Admit: 2020-07-07 | Payer: PRIVATE HEALTH INSURANCE | Attending: Endocrinology, Diabetes & Metabolism | Primary: Adult Health

## 2020-07-07 NOTE — Telephone Encounter
Called patient on 07/07/2020 at 3:29pm.  Regarding her upcoming visit on 07/10/20 at 10:45am.  She will will need blood work done prior to visit. There was no answer so had to leave a message to call back the clinic at [phone].

## 2020-07-10 ENCOUNTER — Ambulatory Visit
Admit: 2020-07-10 | Payer: PRIVATE HEALTH INSURANCE | Attending: Endocrinology, Diabetes & Metabolism | Primary: Adult Health

## 2020-07-15 ENCOUNTER — Telehealth
Admit: 2020-07-15 | Payer: PRIVATE HEALTH INSURANCE | Attending: Endocrinology, Diabetes & Metabolism | Primary: Adult Health

## 2020-07-15 NOTE — Telephone Encounter
Called on 07/15/20 at 4:00pm regarding upcoming appointment on 07/24/20 at 9:45am.  Was calling to remind of appointment date and time and also To let her know she has pending blood work to be done prior to the visit.  There was no answer so left a message to call the clinic back at [phone].

## 2020-07-24 ENCOUNTER — Ambulatory Visit: Admit: 2020-07-24 | Payer: MEDICAID | Attending: Endocrinology, Diabetes & Metabolism | Primary: Adult Health

## 2020-07-24 ENCOUNTER — Encounter
Admit: 2020-07-24 | Payer: PRIVATE HEALTH INSURANCE | Attending: Endocrinology, Diabetes & Metabolism | Primary: Adult Health

## 2020-07-24 DIAGNOSIS — G4733 Obstructive sleep apnea (adult) (pediatric): Secondary | ICD-10-CM

## 2020-07-24 DIAGNOSIS — G9389 Other specified disorders of brain: Secondary | ICD-10-CM

## 2020-07-24 DIAGNOSIS — R631 Polydipsia: Secondary | ICD-10-CM

## 2020-07-24 DIAGNOSIS — E282 Polycystic ovarian syndrome: Secondary | ICD-10-CM

## 2020-07-24 DIAGNOSIS — R3589 Polyuria: Secondary | ICD-10-CM

## 2020-07-24 DIAGNOSIS — Z6841 Body Mass Index (BMI) 40.0 and over, adult: Secondary | ICD-10-CM

## 2020-07-24 DIAGNOSIS — F32A Depression: Secondary | ICD-10-CM

## 2020-07-24 DIAGNOSIS — C801 Malignant (primary) neoplasm, unspecified: Secondary | ICD-10-CM

## 2020-07-24 NOTE — Patient Instructions
MRI as soon as possible, please stop eating and drinking by 8 pm, do fasting labs tomorrow

## 2020-07-24 NOTE — Progress Notes
[facility] CLINICSUBJECTIVE:Gina Aguilar is a 24 y.o. female presenting today for follow-up from neuroophthalmology clinic with complaints of persistent headaches progression of visual blurriness, more prominently in the right eye, and polydipsia/polyuria that has worsened over the last month in setting of a supresellar mass first noted on MRI in April of 2021.In the neurophthalmology clinic, she was prescribed topiramate and sumatriptan for her headaches but reports these do not provide any relief. She notes the headaches last upwards of five hours and are associated with photophobia as well as phonophobia. At the time of this encounter she reports she experiences headaches at least three times a week.With respect to polydipsia/polyuria, she reports drinking a gallon of water within two hours and craving more cold water. This is accompanied by frequent urination, including nocturia (3-4 times per night). As noted below, her PMH is notable for juvenile granulosa cell tumor s/p ovarian cystectomy, obstructive sleep apnea, depression, obesity and suprasellar mass. Review of Systems Constitutional: Positive for unexpected weight change. Eyes: Positive for photophobia, pain, discharge, redness, itching and visual disturbance. Cardiovascular: Positive for chest pain. Gastrointestinal: Positive for nausea. Negative for vomiting. Endocrine: Positive for polydipsia and polyuria. Genitourinary: Positive for frequency and urgency. Skin: Negative.  Neurological: Positive for headaches. Negative for dizziness, syncope and light-headedness. Past Medical History: has a past medical history of BMI 60.0-69.9, adult (HC Code), Cancer (HC Code) (2012), Depression (09/20/2012), OSA (obstructive sleep apnea) (10/17/2014), and PCOS (polycystic ovarian syndrome) (09/20/2012).Allergies: No Known AllergiesCurrent Outpatient Medications: ?  ibuprofen, 600 mg, Oral, Q6H PRN (Patient not taking: Reported on 05/06/2020)?  SPIRONOLACTONE ORAL, Take by mouth.?  topiramate, 100 mg, Oral, NightlyOBJECTIVE:BP 129/84 (Site: o, Position: Sitting, Cuff Size: Large)  - Pulse 72  - Ht 5' 6 (1.676 m)  - Wt (!) 195.3 kg  - SpO2 96%  - BMI 69.50 kg/m? Physical Exam Constitutional: She is oriented to person, place, and time. She appears well-developed and well-nourished. No distress. HENT: Head: Normocephalic and atraumatic. Right Ear: External ear normal. Left Ear: External ear normal. Eyes: Pupils are equal, round, and reactive to light. Conjunctivae and EOM are normal. Right eye exhibits discharge. Left eye exhibits no discharge. No scleral icterus. Cardiovascular: Normal rate, regular rhythm and normal heart sounds. Exam reveals no gallop and no friction rub. No murmur heard.Pulmonary/Chest: Effort normal. No respiratory distress. She has no wheezes. She has no rales. She exhibits no tenderness. Neurological: She is alert and oriented to person, place, and time. LABS:Lab Results Component Value Date  WBC 11.6 (H) 03/28/2020  HGB 13.4 03/28/2020  HCT 41.3 03/28/2020  MCV 92.4 03/28/2020  PLT 450 03/28/2020 Lab Results Component Value Date  CREATININE 0.71 05/06/2020  BUN 13 05/06/2020  NA 139 05/06/2020  K 4.4 05/06/2020  CL 105 05/06/2020  CO2 26 05/06/2020 Lab Results Component Value Date  GLU 77 05/06/2020  CREATININE 0.71 05/06/2020 Lab Results Component Value Date  HGBA1C 5.3 04/27/2019  HGBA1C 5.7 (H) 03/20/2016  HGBA1C 5.8 01/06/2016 Immunization History Administered Date(s) Administered ? COVID-19 Vaccine Linwood Dibbles (J&J) 03/18/2020 Health Maintenance Topic Date Due ? MMR Vaccines (1 of 1 - Standard series) Never done ? Varicella Vaccines (1 of 2 - 2-dose childhood series) Never done ? DTaP/TDaP Vaccines (1 - Tdap) Never done ? HPV vaccine series (1 - 2-dose series) Never done ? HIV screening  Never done ? Chlamydia screening  08/29/2015 ? Hepatitis C screening  Never done ? Tetanus adult (Td q 10,TDAP once)  Never done ? Cervical cancer  screening (Pap Smear)  12/25/2018 ? Influenza vaccine  08/13/2020 ? Covid-19 vaccine series  Completed ? Pneumococcal vaccine,PCV 13 (Prevnar) 0-39Yrs  Aged Out ? Meningococcal Vaccine Peds  Aged Out ? HIB Vaccines  Aged Out ? IPV Vaccines  Aged Out ? Hepatitis A Vaccines  Aged Out ? Rotavirus Vaccines  Aged Out ? Hepatitis B vaccine series  Aged Out ASSESSMENT & PLAN:Gina Aguilar was seen today for follow-up. Given the progression in severity and frequency of headaches, as well as the new onset of polydipsia/polyuria, there is concern for an increase in size of the suprasellar mass with accompanying diabetes insipidus. She has been advised to contact the neurophthalmology clinic to reassess this issue. Though less likely, Hgb A1c and blood glucose levels have also been ordered to evaluate for the possibility of diabetes mellitus. Pending the results of the upcoming MRI, may refer to neurosurgery for consult regarding management of suprasellar mass.Diagnoses and all orders for this visit:Suprasellar mass-     MRI Pituitary with and without IV Contrast; FuturePolyuria-     Comprehensive metabolic panel; Future-     Hemoglobin A1c; Future-     Osmolality; Future-     Osmolality, urine, random; FuturePolydipsia-     Comprehensive metabolic panel; Future-     Hemoglobin A1c; Future-     Osmolality; Future-     Osmolality, urine, random; FutureFollow up: Return in 2 month(s).Discussed plan of care with Attending Physician: Dr. Louretta Shorten MajumdarSIGNED:Zurisadai Helminiak McLean8/11/2020 10:41 AM I have seen and examined the patient and have reviewed the case with MS3 Allena Earing . I agree with the assessment and plan as outlined and have the following additional comments. A 24 year old woman with headaches and a sellar mass who returns for follow-up.So far our evaluation has not shown any evidence of pituitary excess, or pituitary deficiency aside from the fact that now she mentions having irregular menses and we are not sure that is related to pituitary status right now.  However, more recently she reports drinking up to a gallon of water a day, craving ice initially and then a cold water, and urinating frequently, with a baseline overnight urination frequency of 2 times at night, to 3 or 4 times more recently.  She has not lost weight.Her prior evaluation showed 2 elevated urinary free cortisol values, but late night salivary cortisol, and dexamethasone suppression testing did not confirm hypercortisolism.  She had visual field evaluation and Ophthalmology evaluation on July 14th revealing normal visual fields and visual status.  She was tried on topiramate and p.r.n. sumatriptan but these have not helped her headaches.  She also reports her headaches getting worse.She had quit her job and is having some difficulty concentrating in school because of the headaches.  She thinks they are increasing in frequency.At this point I would like to get an MRI of the pituitary again to assess for changes in size, and test her for both diabetes mellitus and diabetes insipidus.Pollie Meyer, MD

## 2020-07-25 DIAGNOSIS — R358 Other polyuria: Secondary | ICD-10-CM

## 2020-08-20 ENCOUNTER — Telehealth
Admit: 2020-08-20 | Payer: PRIVATE HEALTH INSURANCE | Attending: Endocrinology, Diabetes & Metabolism | Primary: Adult Health

## 2020-08-20 DIAGNOSIS — E236 Other disorders of pituitary gland: Secondary | ICD-10-CM

## 2020-08-20 NOTE — Telephone Encounter
[facility] endo 8/12MRI scheduled 9/29Endo f/u 10/12 Labs ordered, not drawn as of yetSpoke with ptShe endorses worsening of headaches since last week in both frequency and intensityShe stopped taking her headache meds awhile ago because they do not workPt plans on having labs done same day as MRI She is requesting to speak with Dr Pricilla Riffle -routing to provider

## 2020-08-20 NOTE — Telephone Encounter
Reports feeling worsening headaches, to the point where she is not able to do anything for them except lay down and wait till they go away over a matter of hours.  I asked her to speak with the neuro ophthalmologist who prescribed medications for her before but otherwise would like to get the MRI as soon as possible and get her in with Dr. Caswell Corwin.

## 2020-08-20 NOTE — Telephone Encounter
Patient stating dr Pricilla Riffle follows her for headaches,Increased headaches, requesting call back to discussPlease call advise thank you

## 2020-08-22 ENCOUNTER — Encounter: Admit: 2020-08-22 | Payer: PRIVATE HEALTH INSURANCE | Attending: Cardiovascular Disease | Primary: Adult Health

## 2020-08-22 DIAGNOSIS — N8500 Endometrial hyperplasia, unspecified: Secondary | ICD-10-CM

## 2020-08-22 NOTE — Telephone Encounter
Great, Thanks so much [place].

## 2020-08-29 ENCOUNTER — Inpatient Hospital Stay: Admit: 2020-08-29 | Discharge: 2020-08-29 | Payer: MEDICAID | Primary: Adult Health

## 2020-08-29 DIAGNOSIS — G9389 Other specified disorders of brain: Secondary | ICD-10-CM

## 2020-08-29 MED ORDER — GADOTERATE MEGLUMINE 0.5 MMOL/ML (376.9 MG/ML) INTRAVENOUS SOLUTION
0.5 mmol/mL (376.9 mg/mL) | Freq: Once | INTRAVENOUS | Status: CP | PRN
Start: 2020-08-29 — End: ?
  Administered 2020-08-29: 14:00:00 0.5 mL via INTRAVENOUS

## 2020-09-10 ENCOUNTER — Ambulatory Visit: Admit: 2020-09-10 | Payer: PRIVATE HEALTH INSURANCE | Primary: Adult Health

## 2020-09-11 ENCOUNTER — Inpatient Hospital Stay: Admit: 2020-09-11 | Discharge: 2020-09-11 | Payer: MEDICAID | Primary: Adult Health

## 2020-09-11 ENCOUNTER — Encounter: Admit: 2020-09-11 | Payer: PRIVATE HEALTH INSURANCE | Attending: Cardiovascular Disease | Primary: Adult Health

## 2020-09-11 DIAGNOSIS — E282 Polycystic ovarian syndrome: Secondary | ICD-10-CM

## 2020-09-11 DIAGNOSIS — N8500 Endometrial hyperplasia, unspecified: Secondary | ICD-10-CM

## 2020-09-13 LAB — HEMOGLOBIN A1C: HEMOGLOBIN A1C: 5.3 % of total Hgb (ref <5.7)

## 2020-09-13 LAB — OSMOLALITY: OSMOLALITY: 293 mOsm/kg (ref 278–305)

## 2020-09-13 LAB — OSMOLALITY, URINE, RANDOM: OSMOLALITY URINE: 686 mOsm/kg (ref 50–1200)

## 2020-09-15 ENCOUNTER — Encounter: Admit: 2020-09-15 | Payer: PRIVATE HEALTH INSURANCE | Attending: Gynecologic Oncology | Primary: Adult Health

## 2020-09-15 ENCOUNTER — Ambulatory Visit: Admit: 2020-09-15 | Payer: MEDICAID | Attending: Gynecologic Oncology | Primary: Adult Health

## 2020-09-15 DIAGNOSIS — G4733 Obstructive sleep apnea (adult) (pediatric): Secondary | ICD-10-CM

## 2020-09-15 DIAGNOSIS — E282 Polycystic ovarian syndrome: Secondary | ICD-10-CM

## 2020-09-15 DIAGNOSIS — Z6841 Body Mass Index (BMI) 40.0 and over, adult: Secondary | ICD-10-CM

## 2020-09-15 DIAGNOSIS — N8501 Benign endometrial hyperplasia: Secondary | ICD-10-CM

## 2020-09-15 DIAGNOSIS — F32A Depression: Secondary | ICD-10-CM

## 2020-09-15 DIAGNOSIS — C801 Malignant (primary) neoplasm, unspecified: Secondary | ICD-10-CM

## 2020-09-16 ENCOUNTER — Encounter
Admit: 2020-09-16 | Payer: PRIVATE HEALTH INSURANCE | Attending: Endocrinology, Diabetes & Metabolism | Primary: Adult Health

## 2020-09-16 ENCOUNTER — Telehealth
Admit: 2020-09-16 | Payer: PRIVATE HEALTH INSURANCE | Attending: Endocrinology, Diabetes & Metabolism | Primary: Adult Health

## 2020-09-16 DIAGNOSIS — E282 Polycystic ovarian syndrome: Secondary | ICD-10-CM

## 2020-09-16 DIAGNOSIS — C801 Malignant (primary) neoplasm, unspecified: Secondary | ICD-10-CM

## 2020-09-16 DIAGNOSIS — G4733 Obstructive sleep apnea (adult) (pediatric): Secondary | ICD-10-CM

## 2020-09-16 DIAGNOSIS — F32A Depression: Secondary | ICD-10-CM

## 2020-09-16 DIAGNOSIS — Z6841 Body Mass Index (BMI) 40.0 and over, adult: Secondary | ICD-10-CM

## 2020-09-16 NOTE — Telephone Encounter
Spoke with her, headaches have been slightly worse over the last month, last few days have been better though, she has an appointment with Neurosurgery, then Neurology, I can see her back in November after those appointments.

## 2020-09-17 NOTE — Telephone Encounter
LVM with # to call for apt

## 2020-09-20 NOTE — Progress Notes
Re: Gina Aguilar (1996-03-28)MRN: [id number] Provider: Pricilla Riffle, MDDate of service: 10/4/2021REFERRING PROVIDER: Latanya Maudlin FOR VISIT: 1. Endometrial hyperplasia without atypia, simple  [place] [facility] Trans[facility] ) HISTORY OF PRESENT ILLNESS: Gina Aguilar is a 24 y.o. female who presents to the Oncology Center on 09/15/2020 for follow up. Patient with history of endometrial hyperplasia.  She currently has IUD and her IUD was placed in 2017, is due to come out and change in 2022.  Ultrasound showed lining to be adequate.  She denies any bleeding.  Overall, doing very well.  She is trying to lose weight, but however, has not been successful and she continues to do that.  Denies any bleeding.  Denies nausea, vomiting.  She is here for followup.  An ultrasound was.REPRODUCTIVE HISTORY: OB History   Gravida 0  Para 0  Term 0  Preterm 0  AB 0  Living 0   SAB 0  TAB 0  Ectopic 0  Molar    Multiple 0  Live Births      PAST HISTORY: Past Medical History: Diagnosis Date ? BMI 60.0-69.9, adult (HC Code) (HC CODE)  ? Cancer (HC Code) [facility] CODE) 2012  Right ovarian granulosa cell carcinoma ? Depression 09/20/2012 ? OSA (obstructive sleep apnea) 10/17/2014  CPAP ? PCOS (polycystic ovarian syndrome) 09/20/2012  Past Surgical History: Procedure Laterality Date ? 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 DENTAL SURGERY   ? DILATION AND CURETTAGE OF UTERUS  04/2016  Dr Earnest Conroy ([facility]) ? KNEE ARTHROSCOPY Right 02/2017  acl repair and meniscal repair ? OVARIAN CYST REMOVAL Right 2018  Dr Elenor Quinones- benign  Current Outpatient Medications Medication Sig Dispense Refill ? ibuprofen (ADVIL,MOTRIN) 600 mg tablet Take 1 tablet (600 mg total) by mouth every 6 (six) hours as needed. (Patient not taking: Reported on 05/06/2020) 30 tablet 0 ? SPIRONOLACTONE ORAL Take by mouth. (Patient not taking: Reported on 09/15/2020)   ? topiramate (TOPAMAX) 25 mg tablet Take 4 tablets (100 mg total) by mouth nightly. (Patient not taking: Reported on 09/15/2020) 120 tablet 5 No current facility-administered medications for this visit. No Known Allergies Social History Tobacco Use ? Smoking status: Never Smoker ? Smokeless tobacco: Never Used Substance Use Topics ? Alcohol use: No  Family History Problem Relation Age of Onset ? Hypertension Mother  ? Stroke Mother 67      HTN related JXB:JYNWGN any abnormal bleeding.  Denies nausea and vomiting.  She is trying to.PHYSICAL EXAM:BP 134/83  - Pulse 69  - Temp 98 ?F (36.7 ?C) (Oral)  - Wt (!) 200 kg  - SpO2 100%  - BMI 71.18 kg/m? Abdomen was obese  tenderness.  Pelvic examination was not done.  ASSESSMENT/PLAN: I had a long discussion with the patient.  I emphasized the importance of the weight loss and she understands this.  She will increase her activity and try to do dieting.  If she has any abnormal bleeding, she will call me.  Otherwise, she will have ultrasound in 6 months and as long as lining is okay and no bleeding, will continue followup, but however, in a year when she needs an IUD change, then at that point we will do a biopsy also.  The biopsy will be done sooner if there is indication.  All her questions were answered and home instruction was given.Thank you for allowing me to participate in the management and care of this patient.Sincerely,Tirza Senteno, MDElectronically Signed  by Pricilla Riffle, MD, September 15, 2020

## 2020-09-23 ENCOUNTER — Ambulatory Visit
Admit: 2020-09-23 | Payer: PRIVATE HEALTH INSURANCE | Attending: Endocrinology, Diabetes & Metabolism | Primary: Adult Health

## 2020-09-24 ENCOUNTER — Encounter: Admit: 2020-09-24 | Payer: PRIVATE HEALTH INSURANCE | Attending: Neurological Surgery | Primary: Adult Health

## 2020-09-24 ENCOUNTER — Ambulatory Visit: Admit: 2020-09-24 | Payer: MEDICAID | Attending: Neurological Surgery | Primary: Adult Health

## 2020-09-24 DIAGNOSIS — E282 Polycystic ovarian syndrome: Secondary | ICD-10-CM

## 2020-09-24 DIAGNOSIS — Z6841 Body Mass Index (BMI) 40.0 and over, adult: Secondary | ICD-10-CM

## 2020-09-24 DIAGNOSIS — G4733 Obstructive sleep apnea (adult) (pediatric): Secondary | ICD-10-CM

## 2020-09-24 DIAGNOSIS — C801 Malignant (primary) neoplasm, unspecified: Secondary | ICD-10-CM

## 2020-09-24 DIAGNOSIS — E236 Other disorders of pituitary gland: Secondary | ICD-10-CM

## 2020-09-24 DIAGNOSIS — F32A Depression: Secondary | ICD-10-CM

## 2020-09-30 ENCOUNTER — Ambulatory Visit: Admit: 2020-09-30 | Payer: MEDICAID | Attending: Neurology | Primary: Adult Health

## 2020-09-30 ENCOUNTER — Encounter: Admit: 2020-09-30 | Payer: PRIVATE HEALTH INSURANCE | Attending: Neurology | Primary: Adult Health

## 2020-09-30 DIAGNOSIS — G4733 Obstructive sleep apnea (adult) (pediatric): Secondary | ICD-10-CM

## 2020-09-30 DIAGNOSIS — Z6841 Body Mass Index (BMI) 40.0 and over, adult: Secondary | ICD-10-CM

## 2020-09-30 DIAGNOSIS — F32A Depression: Secondary | ICD-10-CM

## 2020-09-30 DIAGNOSIS — R519 Headache: Secondary | ICD-10-CM

## 2020-09-30 DIAGNOSIS — E282 Polycystic ovarian syndrome: Secondary | ICD-10-CM

## 2020-09-30 DIAGNOSIS — C801 Malignant (primary) neoplasm, unspecified: Secondary | ICD-10-CM

## 2020-09-30 DIAGNOSIS — D352 Benign neoplasm of pituitary gland: Secondary | ICD-10-CM

## 2020-09-30 DIAGNOSIS — G43009 Migraine without aura, not intractable, without status migrainosus: Secondary | ICD-10-CM

## 2020-09-30 MED ORDER — UBRELVY 100 MG TABLET
100 mg | ORAL_TABLET | Freq: Every day | ORAL | 1 refills | Status: AC | PRN
Start: 2020-09-30 — End: 2020-10-10

## 2020-09-30 MED ORDER — ATENOLOL 25 MG TABLET
25 mg | ORAL_TABLET | Freq: Every day | ORAL | 12 refills | Status: AC
Start: 2020-09-30 — End: 2020-12-31

## 2020-09-30 NOTE — Other
Dr. Jilda Roche has requested neurological consultation on this 24 year old female.She has a history of OSA, PCOS and obesity.She has had headache since she was in HS.  She got the J&J Covid-19 vaccine in 03/18/20 and her headache became much worse after wards.  She would get the abrupt on of severe HA on both sides of her head which was a pounding sensation and associated with nausea and photophobia.  Some of the HA was unprovoked and sometimes the HA seemed to occur with intimate activities.    She can get HA 3 times a week.She had [place] head 05/28/20 which showed a pituitary mass.  [place] venogram head 05/28/20 did not show a venous obstruction.She was evaluated by Dr. Forest Gleason, neuro-ophthalmologist, 06/25/20 who documented normal vision and normal eye exam.MRI 08/29/20 did show an 1.0 x 1.0 cm pituitary mass with out compression of the optic chiasm.The HA have persisted.  She was tried on Topiramate 100 mg daily for a month without a favorable response.  She tried sumatriptan 100 mg which did not help at all.PMHObesityPCOSSleep ApneaOvarian granulosa cell tumorMed NoneFH  Mother HTN, strokeFather well11 sibs OKSHCollege seniorSingleNo childrenNo tobaccoNo alcoholMarijuanaGeneral ExaminationHEENT:  normalNeck:  Supple, non-tender, no carotid bruitsHeart:  Normal rate and rhythm, no murmurs            Good pedal pulses, no edemaChest:Back:  Extremities:  Full ROM, no deformitiesNeurological ExaminationMental Status:   Alert, cooperative, oriented to name, place, time, situation                          Intact recent and remote memory                          Normal mood and affect                          Normal language and thoughtCraneal Nerves:	II  Visual fields intact to confrontation, normal fundoscopy, 	III, IV, VI   EOMI, No nystagmus, PERRL, 	V  symmetrical facial sensation	VII  symmetrical facial expression	VIII  intact hearing and balance	IX, X  good gag, normal swallow, no dysarthria	XI  Intact shoulder shrug and head rotation	XII   Tongue to midlineMotor:  normal bulk, tone, strength, no pronator driftSensory:  Intact to LT,  Romberg negativeCoordination:  Normal RAM, FFM, FtoN, HtoS, gait, tandemDTRs:  2+ and symmetrical at biceps, triceps, brachioradialis,  Patella and AchillesImpression:Gina Aguilar is a 25 year old female with 2 neurological problems:1.  She has a common migraine disorder which seemed to have worsen after she got the change a COVID-19 vaccine.  Her neurological examination and imaging of the head did not show significant abnormalities.  Her pituitary adenoma is not the cause of her headache.  She did not respond to topiramate as a prevention medication or sumatriptan has a symptomatic migraine treatment.  She will go on a 10 loss 25 milligrams daily as a prevention and you will the 100 milligrams as needed for headache.2. 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  She is already been seen by neuro surgery and is undergoing endocrine evaluation.  I reviewed the condition with her informing her that indications for surgery included pituitary dysfunction, visual disturbance and progressive enlargement of the pituitary tumor.  Told her that it did not seem necessary to proceed with the surgical intervention at the present time.  I reassured her that I  did not think that the pituitary enlargement was the cause of her migraine headaches.

## 2020-09-30 NOTE — Progress Notes
The following is the transcribed Review of Systems completed by the patient at intake.Review of Systems Neurological: Positive for tremors and headaches. All other systems reviewed and are negative.

## 2020-10-03 ENCOUNTER — Ambulatory Visit: Admit: 2020-10-03 | Payer: PRIVATE HEALTH INSURANCE | Attending: Neurological Surgery | Primary: Adult Health

## 2020-10-03 ENCOUNTER — Encounter: Admit: 2020-10-03 | Payer: PRIVATE HEALTH INSURANCE | Attending: Neurological Surgery | Primary: Adult Health

## 2020-10-03 DIAGNOSIS — G9389 Other specified disorders of brain: Secondary | ICD-10-CM

## 2020-10-03 DIAGNOSIS — R519 Persistent headaches: Secondary | ICD-10-CM

## 2020-10-03 NOTE — Progress Notes
MULTIDISCIPLINARY PITUITARY TUMOR CONFERENCE:  10/01/2020 DISCUSSION OF:  Imaging	TREATMENT PLAN:  Refer to Neurology for headaches, continue to monitor with imaging

## 2020-10-06 NOTE — Progress Notes
Patient Name: Gina Aguilar  Date of Birth: 1996/08/12     Endo:  Dr Pricilla Riffle    Chief Complaint:  Headaches and pituitary lesion      History of Present Illness:  Gina Aguilar is a 24 y.o., right handed female, who presents with  progressive headaches in the setting of a known sellar lesion.    Past Medical History: She  has a past medical history of BMI 60.0-69.9, adult (HC Code) (HC CODE), Cancer (HC Code) (HC CODE) (2012), Depression (09/20/2012), OSA (obstructive sleep apnea) (10/17/2014), and PCOS (polycystic ovarian syndrome) (09/20/2012).   Past Surgical History: She  has a past surgical history that includes Ovarian cyst removal (Right, 2018); Knee arthroscopy (Right, 02/2017); Abdominal surgery (2012); Dilation and curettage of uterus (04/2016); and Dental surgery.      Current Outpatient Medications:   ?  ibuprofen, 600 mg, Oral, Q6H PRN (Patient not taking: Reported on 05/06/2020)  ?  SPIRONOLACTONE ORAL, Take by mouth. (Patient not taking: Reported on 09/15/2020)  ?  topiramate, 100 mg, Oral, Nightly (Patient not taking: Reported on 09/15/2020)  Allergies: She has No Known Allergies.    Social History:  She  reports no history of drug use.  She  reports no history of alcohol use.  She  reports that she has never smoked. She has never used smokeless tobacco.  Review of Systems    I have reviewed the review of systems, as noted by the clinical support staff.    There were no vitals taken for this visit.     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  Is unclear whether the headaches are related to the sellar lesion but I think it is reasonable to obtain follow-up imaging.  If the lesion grows, we do have an indication to intervene.  Patient understands and agrees with the plan.      Electronically signed by:  Lula Olszewski, MD

## 2020-10-07 ENCOUNTER — Encounter: Admit: 2020-10-07 | Payer: PRIVATE HEALTH INSURANCE | Attending: Neurology | Primary: Adult Health

## 2020-10-07 DIAGNOSIS — G43009 Migraine without aura, not intractable, without status migrainosus: Secondary | ICD-10-CM

## 2020-10-10 MED ORDER — UBRELVY 100 MG TABLET
100 mg | ORAL_TABLET | 1 refills | Status: AC
Start: 2020-10-10 — End: 2020-12-31

## 2020-10-10 NOTE — Telephone Encounter
Medication Refill Safety Checks:Medication order: Ubrelvy 100 mg tabletIs this a new order? NoIf yes, was the previous order discontinued? Not ApplicableIf the order is being updated, did you update the instruction box? Not ApplicableWere the medication safety checks including the 5 rights of medication administration (Patient, medication name, dose, route, time/frequency) completed prior to sending the request to pharmacy? YesFor tapering/titrating dosages, were the medication dosages and schedules verified with a second nurse? Not ApplicableCorrect pharmacy? yes

## 2020-11-11 ENCOUNTER — Ambulatory Visit
Admit: 2020-11-11 | Payer: PRIVATE HEALTH INSURANCE | Attending: Endocrinology, Diabetes & Metabolism | Primary: Adult Health

## 2020-11-24 ENCOUNTER — Encounter
Admit: 2020-11-24 | Payer: PRIVATE HEALTH INSURANCE | Attending: Endocrinology, Diabetes & Metabolism | Primary: Adult Health

## 2020-11-24 ENCOUNTER — Encounter: Admit: 2020-11-24 | Payer: PRIVATE HEALTH INSURANCE | Attending: Neurology | Primary: Adult Health

## 2020-11-25 ENCOUNTER — Encounter: Admit: 2020-11-25 | Payer: PRIVATE HEALTH INSURANCE | Attending: Neurology | Primary: Adult Health

## 2020-12-16 ENCOUNTER — Encounter: Admit: 2020-12-16 | Payer: PRIVATE HEALTH INSURANCE | Primary: Adult Health

## 2020-12-30 ENCOUNTER — Inpatient Hospital Stay: Admit: 2020-12-30 | Discharge: 2020-12-30 | Payer: MEDICAID

## 2020-12-30 MED ORDER — IBUPROFEN 800 MG TABLET
800 mg | ORAL_TABLET | Freq: Four times a day (QID) | ORAL | 1 refills | Status: AC | PRN
Start: 2020-12-30 — End: 2020-12-31

## 2020-12-30 MED ORDER — DICLOFENAC 3 % TOPICAL GEL
3 % | Freq: Two times a day (BID) | TOPICAL | 1 refills | Status: AC
Start: 2020-12-30 — End: 2020-12-31

## 2020-12-30 MED ORDER — OXYCODONE-ACETAMINOPHEN 5 MG-325 MG TABLET
5-325 mg | Freq: Once | ORAL | Status: CP
Start: 2020-12-30 — End: ?
  Administered 2020-12-30: 06:00:00 5-325 mg via ORAL

## 2020-12-30 MED ORDER — KETOROLAC 30 MG/ML (1 ML) INJECTION SOLUTION
30 mg/mL (1 mL) | Freq: Once | INTRAMUSCULAR | Status: CP
Start: 2020-12-30 — End: ?
  Administered 2020-12-30: 06:00:00 30 mL via INTRAMUSCULAR

## 2020-12-30 MED ORDER — OXYCODONE-ACETAMINOPHEN 5 MG-325 MG TABLET
5-325 mg | ORAL_TABLET | Freq: Three times a day (TID) | ORAL | 1 refills | Status: AC | PRN
Start: 2020-12-30 — End: 2020-12-31

## 2020-12-30 NOTE — Discharge Instructions
I am sorry that you have been having painAs we discussed, your discomfort is most likely related to a very superficial burn. However, feel reassured that you are not exhibiting any red flag symptoms.For this, we recommend; Taking ibuprofen every 6 hours for the next few days. Ibuprofen builds in your system so even if that first dose feels like it is not working, the subsequent doses should begun to improve your symptoms over the next few days tylenol as needed I will also prescribe diclofenac gel, which can help symptomatic reliefpercocet is for breakthrough pain, or when your pain is really bad and ibuprofen isnt helping Please see your pcp in 3-4 days to make sure you are improvingAs always, if your symptoms worsen, or if you experience new symptoms, please seek immediate medical help.

## 2020-12-30 NOTE — ED Provider Notes
HistoryChief Complaint Patient presents with ? Thermal Burn   Pt states that hot oil spilled onto her left hand 30 minutes ago, no blistering assessed at this time  Gina Aguilar is a 25 y.o. female with a medical history of right ovarian cancer, headache, pcos, osa on cpap an depression, presenting to the [facility] today with complaints of a burn.The patient complains of burn to her left hand, that began about 30 minutes prior to arrival, describing that she spilt hot oil onto her left hand, has been constant and worsening since. Associated symptoms include redness, while denying any blistering, numbness, tingling, pallor, nor any  fever, chills, shortness of breath, chest pain, nausea, emesis, abdominal pain, or other complaints. Currently, they rate their pain as a 10/10, with a burning quality, while denying radiation. They note palpation as provoking, while denying any palliating factors.  The history is provided by the patient.  Past Medical History: Diagnosis Date ? BMI 60.0-69.9, adult (HC Code) (HC CODE)  ? Cancer (HC Code) [facility] CODE) 2012  Right ovarian granulosa cell carcinoma ? Depression 09/20/2012 ? Headache  ? OSA (obstructive sleep apnea) 10/17/2014  CPAP ? PCOS (polycystic ovarian syndrome) 09/20/2012 Past Surgical History: Procedure Laterality Date ? 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 DENTAL SURGERY   ? DILATION AND CURETTAGE OF UTERUS  04/2016  Dr Earnest Conroy ([facility]) ? KNEE ARTHROSCOPY Right 02/2017  acl repair and meniscal repair ? OVARIAN CYST REMOVAL Right 2018  Dr Elenor Quinones- benign Family History Problem Relation Age of Onset ? Hypertension Mother  ? Stroke Mother 20      HTN related ? Dementia Paternal Grandmother  Social History Socioeconomic History ? Marital status: Single   Spouse name: Not on file ? Number of children: Not on file ? Years of education: Not on file ? Highest education level: Not on file Tobacco Use ? Smoking status: Never Smoker ? Smokeless tobacco: Never Used Vaping Use ? Vaping Use: Never used Substance and Sexual Activity ? Alcohol use: Yes   Comment: socially ? Drug use: Yes   Types: Marijuana ? Sexual activity: Not Currently   Birth control/protection: None Social History Narrative  Lives with foster mother. No smokers at home. Senior in high school. Going to [employer]  ED Other Social History ? E-cigarette status Never User  ? E-Cigarette Use Never User  ? Cannabis frequency 1-2 times/week 1-2 times/week on 09/04/2019 E-cigarette/Vaping Substances E-cigarette/Vaping Devices Review of Systems Constitutional: Negative for chills and fever. Respiratory: Negative for shortness of breath.  Cardiovascular: Negative for chest pain. Gastrointestinal: Negative for abdominal pain, diarrhea, nausea and vomiting. Musculoskeletal:      Left hand pian  Neurological: Negative for weakness and numbness. All other systems reviewed and are negative. Physical ExamED Triage Vitals [12/30/20 0016]BP: (!) 149/78Pulse: 77Pulse from  O2 sat: n/aResp: 20Temp: 97 ?F (36.1 ?C)Temp src: TemporalSpO2: 100 % BP (!) 149/78  - Pulse 77  - Temp 97 ?F (36.1 ?C) (Temporal)  - Resp 20  - Wt (!) 191.3 kg (421 lb 11.8 oz)  - SpO2 100%  - BMI 68.07 kg/m? Physical ExamVitals and nursing note reviewed. Constitutional:     General: She is not in acute distress.   Appearance: She is not ill-appearing. HENT:    Head: Normocephalic and atraumatic. Cardiovascular:    Rate and Rhythm: Normal rate.    Pulses: Normal pulses. Pulmonary:    Effort: Pulmonary effort is normal. No respiratory distress.  Skin:   General: Skin is warm and dry.    Capillary Refill: Capillary refill takes less than 2 seconds.    Findings: Erythema present.    Comments: Erythema to left hand 1st and 2nd digits, without and purulence or blistering.  Neurological:    Mental Status: She is alert. Mental status is at baseline. Psychiatric:       Behavior: Behavior normal.   ProceduresProcedures ED COURSEInterpreted by ED Provider: pulse oximetryPatient Reevaluation: Gina Aguilar is a 24 y.o. female with a medical history of right ovarian cancer, headache, pcos, osa on cpap an depression, presenting to the [facility] today with complaints of a burn.:::::DDX:::::My working diagnosis at this time is a partial thickness burn, given lack of numbness, tingling, or lack of rom. However, my ddx includes allergic reaction, medication reaction,cellulitis, among others. However, as patient has reassuring exam I find these very unlikely. wil consult burn. :::::WORK UP:::::EKG:Deferred LABS:Deferred IMAGING:Deferred MEDS:ketorolac (TORadol) injection 15 mg (15 mg Intramuscular Given 12/30/20 0052)oxyCODONE-acetaminophen (PERCOCET) 5-325 mg per tablet 1 tablet (1 tablet Oral Given 12/30/20 0052) :::::ED COURSE / PLAN:::::12:38 AM  I reviewed the patient's chart including Narx Score12:41 AM I met with the patient to go over an initial history and physical exam 12:47 AM paged burn 1:04 AM spoke with burn 1:16 AM burn clears patient2:04 AM I rechecked on the patient. Pt cleared by burn. Will send home with symptomatic cares including percocet, and they can follow up in clinic on Friday. 2:14 AM At discharge, patient was tolerating a regular diet, their?symptoms were well-controlled with oral medication, and they were able to void spontaneously. Patient was educated and provided information on their diagnosis. Medications and side effects were also discussed with patient, and it is recommended that they read all package inserts to the meds. Patients will follow up with their primary care provider as needed on an out patient basis. Strict return precautions were discussed. Patient verbalizes understanding, agrees to the plan, and acknowledges to return with new or worsening symptoms.  Disclaimer: Portions of this document may have been transcribed using speech recognition software. Despite attempts at proofreading this document may contain transcription errors. Please do not hesitate to contact this writer to request clarification.??Patient progress: stableClinical Impressions as of Dec 30 416 Thermal burn  ED DispositionDischarge Octavia Heir, PA01/18/22 [phone]

## 2020-12-30 NOTE — ED Notes
Into Complaining pain left 1st and 2nd digits after hot oil spilled.  15 mg IM toradol and 1 tab percocet.Seen by PA Adelfa Koh.Discharge order in.Patient went home ambulatory and stable.

## 2020-12-30 NOTE — Other
[facility] Aguilar HealthBurn Surgery Consult NoteAttending Provider: No att. providers Fidencia Mccloud [id number] Hospital Day: 1 Consult attending: TsungConsult from: No att. providers foundConsult question: Hand burn HPI Gina Aguilar is a 25 y.o. female with a no Significant PMHx who presents with <1% TBSA splash burn to L hand from cooking oil. Came to ED and received a dose of toradol and perococet. In the ED, [name] was hemodynamically stable.   Review of Allergies/Meds/Hx PMHx:  has a past medical history of BMI 60.0-69.9, adult (HC Code) (HC CODE), Cancer (HC Code) (HC CODE) (2012), Depression (09/20/2012), Headache, OSA (obstructive sleep apnea) (10/17/2014), and PCOS (polycystic ovarian syndrome) (09/20/2012). PSHx:  has a past surgical history that includes Ovarian cyst removal (Right, 2018); Knee arthroscopy (Right, 02/2017); Abdominal surgery (2012); Dilation and curettage of uterus (04/2016); and Dental surgery. FHx:  Family History Problem Relation Age of Onset  Hypertension Mother   Stroke Mother 1      HTN related  Dementia Paternal Grandmother  Review of Medications: No current facility-administered medications on file prior to encounter. Current Outpatient Medications on File Prior to Encounter Medication Sig Dispense Refill  atenoloL (TENORMIN) 25 mg tablet Take 1 tablet (25 mg total) by mouth daily. 30 tablet 11  UBRELVY 100 mg tablet TAKE 1 TABLET(100 MG) BY MOUTH DAILY AS NEEDED 10 tablet 0  ibuprofen (ADVIL,MOTRIN) 600 mg tablet Take 1 tablet (600 mg total) by mouth every 6 (six) hours as needed. (Patient not taking: Reported on 05/06/2020) 30 tablet 0  SPIRONOLACTONE ORAL Take by mouth. (Patient not taking: Reported on 09/15/2020)    topiramate (TOPAMAX) 25 mg tablet Take 4 tablets (100 mg total) by mouth nightly. (Patient not taking: Reported on 09/15/2020) 120 tablet 5 Allergies: Patient has no known allergies.Review of Systems: All other systems negative except per HPI.Objective: Temp:  [97 ?F (36.1 ?C)] 97 ?F (36.1 ?C)Pulse:  [77] 77Resp:  [20] 20BP: (149)/(78) 149/78SpO2:  [100 %] 100 %Device (Oxygen Therapy): room airNo intake/output data recorded.Physical ExamGEN: NADNEURO: Alert/oriented x 3, resting in bed comfortably, responding appropriatelyHEENT:  NCATPULM: No respiratory distress, breathing comfortably on RACV: Regular rateABD: Soft, nondistended, nontender, no rebound/guardingEXT: Moves all extremities equally, warm and well perfused. L hand burn <1% TBSA, no obvious open blistering or wound. Tender and sensitive to splash regions.Studies: Labs:CBC:No results for input(s): WBC, HGB, HCT, PLT in the last 72 hours.BMP:No results for input(s): NA, K, CL, CO2, BUN, CREATININE, CALCIUM, MG, PHOS in the last 72 hours.Coags:No results for input(s): LABPT, INR, PTT in the last 72 hours.Invalid input(s): PTBlood GluNo results for input(s): GLU in the last 72 hours.LFTNo results for input(s): BILITOT, BILIDIR, AST, ALT, ALKPHOS in the last 72 hours. Diagnostics:No results found.Assessment: Gina Aguilar is a 25 y.o. female with a no Significant PMHx who presents with <1% TBSA splash burn to L hand from cooking oil. Came to ED and received a dose of toradol and perococet. In the ED, [name] was hemodynamically stable.  Plan:  - Follow up in burn clinic as needed- No dressings needed as no obvious burn woundPatient seen and discussed with chief resident, Dr. Lorel Monaco and attending, Dr. Freada Bergeron.  Final plan per attending addendumPlease page/call Burn Surgery with questions.Signed:David Su, MDGeneral Surgery Resident PGY2Reachable by Mobile Heartbeat1/18/2022Attending:  Pt discussed w/ me in detail. I agree with the above assessment.Patience Musca, MD

## 2020-12-31 ENCOUNTER — Encounter: Admit: 2020-12-31 | Payer: PRIVATE HEALTH INSURANCE | Attending: Neurology | Primary: Adult Health

## 2020-12-31 ENCOUNTER — Encounter: Admit: 2020-12-31 | Payer: MEDICAID | Attending: Neurology | Primary: Adult Health

## 2020-12-31 ENCOUNTER — Encounter: Admit: 2020-12-31 | Payer: PRIVATE HEALTH INSURANCE | Primary: Adult Health

## 2020-12-31 DIAGNOSIS — E282 Polycystic ovarian syndrome: Secondary | ICD-10-CM

## 2020-12-31 DIAGNOSIS — G43009 Migraine without aura, not intractable, without status migrainosus: Secondary | ICD-10-CM

## 2020-12-31 DIAGNOSIS — C801 Malignant (primary) neoplasm, unspecified: Secondary | ICD-10-CM

## 2020-12-31 DIAGNOSIS — R519 Headache: Secondary | ICD-10-CM

## 2020-12-31 DIAGNOSIS — F32A Depression: Secondary | ICD-10-CM

## 2020-12-31 DIAGNOSIS — G4733 Obstructive sleep apnea (adult) (pediatric): Secondary | ICD-10-CM

## 2020-12-31 DIAGNOSIS — Z6841 Body Mass Index (BMI) 40.0 and over, adult: Secondary | ICD-10-CM

## 2020-12-31 MED ORDER — UBRELVY 100 MG TABLET
100 mg | ORAL_TABLET | ORAL | 6 refills | Status: AC | PRN
Start: 2020-12-31 — End: 2021-07-16

## 2020-12-31 NOTE — Progress Notes
Patient name: Gina Aguilar MRN:	 [id number] Date of birth:	 [birth year]Date of visit:	 1/19/2022Provider:	 Susa Day, PAHistory of present illness:Gina Aguilar is a 25 y.o. female who returns for follow up. Last seen 09/30/2020 .VIDEO TELEHEALTH VISIT: This clinician is part of the telehealth program and is conducting this visit in a currently approved location. For this visit the clinician and patient were present via interactive audio & video telecommunications system that permits real-time communications, via the [employer].Patient's use of the telehealth platform followed consent and acknowledges agreement to permit telehealth for this visit. State patient is located in: [place]The clinician is appropriately licensed in the above state to provide care for this visit. Other individuals present during the telehealth encounter and their role/relation: noneIf billing based on time, please complete (Not required if billing based on MDM):                           Total time spent in medical video consultation: 20 min; Total time spent by the provider on the day of service, which includes time spent on chart review, medical video consultation, education, coordination of care/services and counseling Because this visit was completed over video, a hands-on physical exam was not performed.  Patient/parent or guardian understands and knows to call back if condition changes.Hx OSA, PCOS, migraines. Having increased headache frequency following J&J Covid-19 vaccine last year. Describes severe throbbing HA with associated nausea, photophobia. Worse with stress. No improvement with topiramate or sumatriptan. 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 Not felt to be related to her headaches. At her initial visit with Dr. Michaell Cowing, she was prescribed atenolol for headache prevention as well as Ubrelvy PRN. She took atenolol daily for a few weeks but stopped a few months ago. Now headaches have decreased, maybe once a week. Ubrelvy PRN works well. I have reviewed the review of systems, and it is listed in the clinical support section of the record.   Reviewed and updated is the past medical history, past surgical history, and past social history.  They are as follows: She  has a past medical history of BMI 60.0-69.9, adult (HC Code) (HC CODE), Cancer (HC Code) (HC CODE) (2012), Depression (09/20/2012), Headache, OSA (obstructive sleep apnea) (10/17/2014), and PCOS (polycystic ovarian syndrome) (09/20/2012).She  has a past surgical history that includes Ovarian cyst removal (Right, 2018); Knee arthroscopy (Right, 02/2017); Abdominal surgery (2012); Dilation and curettage of uterus (04/2016); and Dental surgery.Her family history includes Dementia in her paternal grandmother; Hypertension in her mother; Stroke (age of onset: 38) in her mother.Social History Occupational History ? Not on file Tobacco Use ? Smoking status: Never Smoker ? Smokeless tobacco: Never Used Vaping Use ? Vaping Use: Never used Substance and Sexual Activity ? Alcohol use: Yes   Comment: socially ? Drug use: Yes   Types: Marijuana ? Sexual activity: Not Currently   Birth control/protection: None Allergies: Patient has no known allergies.Reviewed and updated is the medication list:She has a current medication list which includes the following prescription(s): ubrelvy. Neurologic Exam Mental Status Oriented to person, place, and time. Cranial Nerves Cranial nerves II through XII intact. Physical ExamConstitutional:     Appearance: Normal appearance. Neurological:    Mental Status: She is alert and oriented to person, place, and time.   Impression and plan:Encounter Diagnosis Name Primary? ? Migraine without aura and without status migrainosus, not intractable Fortunately headaches have improved in terms of frequency without medication, now maybe once  a week. Will not start any new preventive therapy today, though would consider restarting atenolol in the future if warranted. Will use Ubrelvy PRN which has worked well for her. Electronically signed by: Marland Kitchen

## 2021-01-06 ENCOUNTER — Ambulatory Visit
Admit: 2021-01-06 | Payer: PRIVATE HEALTH INSURANCE | Attending: Endocrinology, Diabetes & Metabolism | Primary: Adult Health

## 2021-03-13 ENCOUNTER — Ambulatory Visit: Admit: 2021-03-13 | Payer: PRIVATE HEALTH INSURANCE | Primary: Adult Health

## 2021-03-23 ENCOUNTER — Ambulatory Visit: Admit: 2021-03-23 | Payer: PRIVATE HEALTH INSURANCE | Attending: Gynecologic Oncology | Primary: Adult Health

## 2021-04-03 ENCOUNTER — Encounter: Admit: 2021-04-03 | Payer: PRIVATE HEALTH INSURANCE | Attending: Adult Health | Primary: Adult Health

## 2021-08-10 ENCOUNTER — Encounter: Admit: 2021-08-10 | Payer: PRIVATE HEALTH INSURANCE | Attending: Gynecologic Oncology | Primary: Adult Health

## 2021-08-10 ENCOUNTER — Ambulatory Visit: Admit: 2021-08-10 | Payer: MEDICAID | Attending: Gynecologic Oncology | Primary: Adult Health

## 2021-08-10 DIAGNOSIS — F32A Depression: Secondary | ICD-10-CM

## 2021-08-10 DIAGNOSIS — R519 Headache: Secondary | ICD-10-CM

## 2021-08-10 DIAGNOSIS — Z6841 Body Mass Index (BMI) 40.0 and over, adult: Secondary | ICD-10-CM

## 2021-08-10 DIAGNOSIS — C801 Malignant (primary) neoplasm, unspecified: Secondary | ICD-10-CM

## 2021-08-10 DIAGNOSIS — N8501 Benign endometrial hyperplasia: Secondary | ICD-10-CM

## 2021-08-10 DIAGNOSIS — G4733 Obstructive sleep apnea (adult) (pediatric): Secondary | ICD-10-CM

## 2021-08-10 DIAGNOSIS — E282 Polycystic ovarian syndrome: Secondary | ICD-10-CM

## 2021-08-10 MED ORDER — LEVONORGESTREL 21 MCG/24 HOURS (8 YRS) 52 MG INTRAUTERINE DEVICE
21 mcg/24 hours (8 yrs) 52 mg | Freq: Once | INTRAUTERINE | Status: CP
Start: 2021-08-10 — End: ?
  Administered 2021-08-10: 17:00:00 21 mcg/24 hours (8 yrs) 52 mg via INTRAUTERINE

## 2021-08-17 NOTE — Progress Notes
Re: Melvin Marmo (Sep 10, 1996)MRN: [id number] Provider: Pricilla Riffle, MDDate of service: 8/29/2022REFERRING PROVIDER: Latanya Maudlin FOR VISIT: 1. Endometrial hyperplasia without atypia, simple  Surgical pathology     [facility]) HISTORY OF PRESENT ILLNESS: Gina Aguilar is a 25 y.o. female who presents to the [facility]cology Center on 08/10/2021 for follow up. Patient with morbid obesity, BMI of 65, and also a history of hyperplasia, status post IUD placement, which overall has been stable and the bleeding has been managed.  Doing overall very well, and she is trying to do weight losing and exercises.  However, she has had the IUD for more than 5 years, and she is here for exchange of IUD.  Pregnancy test was negative.  The patient is here for removal of IUD and placement of a new IUD.REPRODUCTIVE HISTORY: OB History   Gravida 0  Para 0  Term 0  Preterm 0  AB 0  Living 0   SAB 0  IAB 0  Ectopic 0  Molar    Multiple 0  Live Births      PAST HISTORY: Past Medical History: Diagnosis Date ? BMI 60.0-69.9, adult (HC Code) (HC CODE) (HC Code)  ? Cancer (HC Code) (HC CODE) (HC Code) 2012  Right ovarian granulosa cell carcinoma ? Depression 09/20/2012 ? Headache  ? OSA (obstructive sleep apnea) 10/17/2014  CPAP ? PCOS (polycystic ovarian syndrome) 09/20/2012  Past Surgical History: Procedure Laterality Date ? 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 DENTAL SURGERY   ? DILATION AND CURETTAGE OF UTERUS  04/2016  Dr Earnest Conroy ([facility]) ? KNEE ARTHROSCOPY Right 02/2017  acl repair and meniscal repair ? OVARIAN CYST REMOVAL Right 2018  Dr Elenor Quinones- benign  Current Outpatient Medications Medication Sig Dispense Refill ? clindamycin (CLEOCIN T) 1 % Swab Apply topically 2 (two) times daily. 60 each 6 ? selenium sulfide (SELSUN) 2.5 % lotion Apply topically daily as needed for itching. 120 mL 6 ? ubrogepant (UBRELVY) 100 mg tablet Take 1 tablet (100 mg total) by mouth as needed (migraine). 10 tablet 5 No current facility-administered medications for this visit. No Known Allergies Social History Tobacco Use ? Smoking status: Never Smoker ? Smokeless tobacco: Never Used Substance Use Topics ? Alcohol use: Yes   Comment: socially  Family History Problem Relation Age of Onset ? Hypertension Mother  ? Stroke Mother 12      HTN related ? Dementia Paternal Grandmother  WUJ:WJXBJYN overall doing well with the IUD and is comfortable and not interested in fertility right now.  However, she wants to maintain future fertility.  She is trying to lose weight.  She is here for exchange of her IUD.PHYSICAL EXAM:BP (!) 145/73  - Pulse 85  - Temp 97.5 ?F (36.4 ?C) (Temporal)  - Resp 20  - Wt (!) 182.8 kg  - SpO2 98%  - BMI 65.05 kg/m? PHYSICAL EXAMINATION:ABDOMEN:  Obese.  EXTREMITIES:  Nontender.  BACK:  Showed no CVA tenderness.INDICATIONS:I had a long discussion with the patient.  The pros and cons of the removal of IUD, including bleeding, infection, risk of perforation, infection, and etc. were discussed.  Placement of a new IUD and the side effects were discussed in detail.  Progestin-releasing IUD was opted.  Informed consent was signed, after all of her questions were answered.  The risk of bleeding was discussed.PROCEDURE NOTE:Patient was placed in dorsal lithotomy position, after a time-out was taken and her consent was reviewed.  The speculum examination  was used.  The large speculum needed to be used, secondary to the cervix to be high in the vagina.  The IUD string was noticed, and this area was prepped.  The string was grasped, and the IUD was removed.  However, there was moderate difficulty, but we were able to remove the IUD intact.  It was checked, and it was completely intact.  At this point, the area was prepped again.  Using the progestin-releasing IUD, Mirena IUD was placed adequately, and the string was cut about 4 cm from the from the exocervix.  The patient tolerated that very well, and there was no bleeding.  The exam showed uterus to be nontender. The patient tolerate the procedure very well.  ASSESSMENT/PLAN: Patient with a history of endometrial hyperplasia and also obesity.  The old IUD was removed, and a new IUD was placed.  Patient tolerated that very well.  The home-going instructions were given.  She will follow up with [place].  The post-IUD placement instructions were given.Thank you for allowing me to participate in the management and care of this patient.Sincerely,Keifer Habib, MDElectronically Signed by Pricilla Riffle, MD, August 17, 2021

## 2021-09-11 ENCOUNTER — Telehealth: Admit: 2021-09-11 | Payer: PRIVATE HEALTH INSURANCE | Attending: Gynecologic Oncology | Primary: Adult Health

## 2021-09-11 ENCOUNTER — Encounter: Admit: 2021-09-11 | Payer: PRIVATE HEALTH INSURANCE | Attending: Cardiovascular Disease | Primary: Adult Health

## 2021-09-11 DIAGNOSIS — Z30431 Encounter for routine checking of intrauterine contraceptive device: Secondary | ICD-10-CM

## 2021-09-11 DIAGNOSIS — R102 Pelvic and perineal pain: Secondary | ICD-10-CM

## 2021-09-11 NOTE — Telephone Encounter
Patient called. Re: pt having bad cramping and spotting since she had her IUD replacement from August. Please advise.

## 2021-09-11 NOTE — Telephone Encounter
Spoke with pt.  She reports cramping and spotting since IUD placement on 8/29.  She has had IUDs in the past and knows these are common side effects but she did not expect them to last this long.  The cramping is affecting her ability to work.  She states she has tried Advil 200mg  and Tylenol with little relief.  Recommended to pt she rotate Ibuprofen 600mg  q6h with Tylenol ES q8h NTE 4g/day.  Will also route to provider for further recommendation.  Pt verbalized good understanding and is in agreement with plan.

## 2021-09-11 NOTE — Telephone Encounter
Wyvonna Plum, PA  You; [facility] App Pool 5 minutes ago (4:28 PM) MFThat's all we can really do is alternate like you said. This can happen shortly after IUD placement until one adjusts to it. I do recommend that we also check an [place] to make sure there is proper placement. I will place order now. Melissa  Message text  Scheduling to contact pt with [place] appt.  No answer at time of call back to pt.  LM on VM informing her of the above.  Call back # left.

## 2021-10-21 ENCOUNTER — Telehealth: Admit: 2021-10-21 | Payer: PRIVATE HEALTH INSURANCE | Attending: Gynecologic Oncology | Primary: Adult Health

## 2021-10-21 NOTE — Telephone Encounter
Gina Aguilar would like to have IUD removed due to some pain that she is having Please call pt back or advise for scheduling

## 2021-11-12 ENCOUNTER — Encounter
Admit: 2021-11-12 | Payer: PRIVATE HEALTH INSURANCE | Attending: Endocrinology, Diabetes & Metabolism | Primary: Adult Health

## 2021-11-27 ENCOUNTER — Telehealth
Admit: 2021-11-27 | Payer: PRIVATE HEALTH INSURANCE | Attending: Endocrinology, Diabetes & Metabolism | Primary: Adult Health

## 2021-11-27 NOTE — Telephone Encounter
Routing to available attending for review.Per Dr. Pricilla Riffle 12/1/22You need to have a pituitary MRI done to see if that lesion is growing larger.

## 2021-11-27 NOTE — Telephone Encounter
Patient states she is experiencing headaches Requesting to have MRI done before appt on 1/126/23Please advise.

## 2021-12-01 ENCOUNTER — Telehealth: Admit: 2021-12-01 | Payer: PRIVATE HEALTH INSURANCE | Attending: Neurological Surgery | Primary: Adult Health

## 2021-12-01 NOTE — Telephone Encounter
[facility] MESSAGETime of call:   5:25 PMCaller:   KimoneCaller's relationship to patient:  self  Calling from (pharmacy, hospital, agency, etc.):  n/a   Reason for call:   Patient calling asking for a order to be put in for a MRI patient is asking for a phone call back to go over why she need a MRI Please Advise.If not feeling well, what are symptoms:  n/a   If having symptoms, how long have the symptoms been present:  n/a   Does caller request to speak to someone urgently?  no   If yes, warm transferred to:  Nurse - name: n/aBest telephone number for callback:   [phone] Best time to return call:   Any Permission to leave message:  yes   [facility] Referral Specialist

## 2021-12-02 ENCOUNTER — Encounter
Admit: 2021-12-02 | Payer: PRIVATE HEALTH INSURANCE | Attending: Endocrinology, Diabetes & Metabolism | Primary: Adult Health

## 2021-12-02 NOTE — Telephone Encounter
Pending with Almira Coaster to please advise patient.

## 2021-12-02 NOTE — Telephone Encounter
Called and spoke to pt at [phone]. Advised pt Dr.Majumdar ordered a pituitary MRI and would like pt to have it done to see if lesion is growing larger. Provided pt phone number for MRI scheduling [phone] also centralized scheduling [phone].

## 2021-12-11 NOTE — Telephone Encounter
Patient added to list.  Will call patient and schedule accordingly.

## 2021-12-31 ENCOUNTER — Encounter: Admit: 2021-12-31 | Payer: MEDICAID | Attending: Neurology | Primary: Adult Health

## 2022-01-07 ENCOUNTER — Ambulatory Visit: Admit: 2022-01-07 | Payer: MEDICAID | Primary: Adult Health

## 2022-01-25 ENCOUNTER — Encounter
Admit: 2022-01-25 | Payer: PRIVATE HEALTH INSURANCE | Attending: Endocrinology, Diabetes & Metabolism | Primary: Adult Health

## 2022-01-25 ENCOUNTER — Inpatient Hospital Stay: Admit: 2022-01-25 | Discharge: 2022-01-25 | Payer: MEDICAID | Primary: Adult Health

## 2022-01-25 DIAGNOSIS — E236 Other disorders of pituitary gland: Secondary | ICD-10-CM

## 2022-01-27 ENCOUNTER — Ambulatory Visit: Admit: 2022-01-27 | Payer: MEDICAID | Attending: Endocrinology, Diabetes & Metabolism | Primary: Adult Health

## 2022-02-08 ENCOUNTER — Ambulatory Visit: Admit: 2022-02-08 | Payer: MEDICAID | Attending: Gynecologic Oncology | Primary: Adult Health

## 2022-02-08 ENCOUNTER — Encounter: Admit: 2022-02-08 | Payer: PRIVATE HEALTH INSURANCE | Attending: Gynecologic Oncology | Primary: Adult Health

## 2022-02-08 DIAGNOSIS — R102 Pelvic and perineal pain: Secondary | ICD-10-CM

## 2022-02-08 DIAGNOSIS — E282 Polycystic ovarian syndrome: Secondary | ICD-10-CM

## 2022-02-08 DIAGNOSIS — F32A Depression: Secondary | ICD-10-CM

## 2022-02-08 DIAGNOSIS — N8501 Benign endometrial hyperplasia: Secondary | ICD-10-CM

## 2022-02-08 DIAGNOSIS — C801 Malignant (primary) neoplasm, unspecified: Secondary | ICD-10-CM

## 2022-02-08 DIAGNOSIS — G4733 Obstructive sleep apnea (adult) (pediatric): Secondary | ICD-10-CM

## 2022-02-08 DIAGNOSIS — R519 Headache: Secondary | ICD-10-CM

## 2022-02-08 DIAGNOSIS — Z6841 Body Mass Index (BMI) 40.0 and over, adult: Secondary | ICD-10-CM

## 2022-02-09 DIAGNOSIS — N83201 Unspecified ovarian cyst, right side: Secondary | ICD-10-CM

## 2022-02-13 NOTE — Progress Notes
RE:Gina Aguilar: [id number] DOB: [birth year]Referring Provider: Enriqueta Shutter, [address],  [place] [id number] Care Provider: Heide Guile JeanProvider: Pricilla Riffle, MDDate of Service: 2/27/2023CC: Endometrial hyperplasia without atypia. [id number] Gina Aguilar is a 26 y.o. female who [facility] on 02/08/2022 for follow up of endometrial hyperplasia without atypia. Patient with morbid obesity, BMI of 65, and also a history of hyperplasia, status post IUD placement, which overall has been stable and the bleeding has been managed.  Doing overall very well, and she is trying to do weight losing and exercises. The patient has an IUD placed since 08/10/21. No results found for: CA125Y, CA125Q, CA125L, CA125BH, CA125GH Cancer Staging No matching staging information was found for the patient.Oncology History  No history exists. Patient Active Problem List Diagnosis ? Juvenile granulosa cell tumor ? PCOS (polycystic ovarian syndrome) ? Morbid obesity with body mass index of 60.0-69.9 in adult (HC Code) (HC CODE) (HC Code) ? Endometrial hyperplasia without atypia, simple ? Obstructive sleep apnea ? Ovarian cyst, right ? Problem with vascular access ? S/P ovarian cystectomy ? S/P ACL reconstruction ? Abnormal uterine bleeding (AUB) ? Abnormal hair growth from preauricular area towards lateral cheekbone ? Pituitary adenoma (HC Code) (HC CODE) (HC Code) ? Migraine without aura and without status migrainosus, not intractable ? Seborrheic dermatitis ? Hidradenitis suppurativa PAST MEDICAL HX: Gina Aguilar  has a past medical history of BMI 60.0-69.9, adult (HC Code) (HC CODE) (HC Code), Cancer (HC Code) (HC CODE) (HC Code) (2012), Depression (09/20/2012), Headache, OSA (obstructive sleep apnea) (10/17/2014), and PCOS (polycystic ovarian syndrome) (09/20/2012).PAST SURGICAL HX:  She has a past surgical history that includes Ovarian cyst removal (Right, 2018); Knee arthroscopy (Right, 02/2017); Abdominal surgery (2012); Dilation and curettage of uterus (04/2016); and Dental surgery.SOCIAL HX: Patient  reports that she has never smoked. She has never used smokeless tobacco. She reports current alcohol use. She reports current drug use. Drug: Marijuana.FAMILY HX: Her family history includes Dementia in her paternal grandmother; Hypertension in her mother; Stroke (age of onset: 66) in her mother.ALLERGIES: Patient has No Known Allergies.CURRENT MEDICATIONS: ? clindamycin Apply topically 2 (two) times daily. ? selenium sulfide Apply topically daily as needed for itching. ? Bernita Raisin Take 1 tablet (100 mg total) by mouth as needed (migraine). ROS: See HPIPHYSICAL EXAM:BP 138/79  - Pulse 70  - Temp 97.6 ?F (36.4 ?C) (Temporal)  - Resp 18  - Wt (!) 184.2 kg  - LMP 01/18/2022  - SpO2 100%  - BMI 65.53 kg/m? APPEARANCE: Appropriate for age. Body mass index is 65.53 kg/m?.CHEST: Lungs were clear to auscultation bilaterally. Normal respiratory effort. CARDIOVASCULAR: Heart regular rate and rhythm. No evidence of murmurs.  No lower extremity varicosities were noted. No peripheral edema noted. ABDOMEN: Abdomen was soft, non-tender with no masses, hepatosplenomegaly or hernias.   The bowel sounds were normal. PELVIC EXAM: IUD string seen. LABSLab Results Component Value Date  WBC 11.6 (H) 03/28/2020  ANCANC 8.8 (H) 03/28/2020  HGB 13.4 03/28/2020  HCT 41.3 03/28/2020  MCV 92.4 03/28/2020  PLT 450 03/28/2020 ASSESSMENT/PLAN:26 y.o. female who presents for follow up of endometrial hyperplasia without atypia. Patient with morbid obesity, BMI of 65, and also a history of hyperplasia, status post IUD placement, which overall has been stable and the bleeding has been managed.  Doing overall very well, and she is trying to do weight losing and exercises. The patient has an IUD placed since 08/10/21. - Repeat ultrasound in 3 months.- Call with ultrasound results. - Advised  to loose weight.  - Follow up in 4 months. All of the patient's questions were answered and she was in agreement with this plan.Thank you for your referral of this patient and for allowing [place] to participate in her care.The plan was discussed with Dr. Pricilla Riffle, MD.The patient was seen and examined by Dr. Pricilla Riffle, MD and his fellow. Scribed for Pricilla Riffle, MD by Clent Jacks, medical scribe February 08, 2022  The documentation recorded by the scribe accurately reflects the services I personally performed and the decisions made by me. I reviewed and confirmed all material entered and/or pre-charted by the scribe. Patient was seen along with the fellow.  She is a 26 year old female morbidly obese and she had a complex hyperplasia, status post IUD change in biopsy which showed the stromal breakdown with no evidence of hyperplasia.  An IUD was recently changed.  Overall, doing very well.  She has some discomfort.  Examination done, string was seen.  Patient will have a repeat ultrasound in 2 months.  Patient had an ultrasound to make sure there is no reason for her pain and we will call for results.  As long as that is adequate, she will see me in 4 months and will continue followup.  She may need biopsies in future. ?We discussed the weight loss as well.?Electronically Signed by Pricilla Riffle, MD, February 08, 2022??

## 2022-02-13 NOTE — Progress Notes
Re: Gina Aguilar (03/31/1996)MRN: [id number] Provider: Pricilla Riffle, MDReferring Provider: Enriqueta Shutter [address],   [id number] Care Provider: Tamala Fothergill of service: 2/27/2023REASON FOR VISIT: hyperplasia follow Gina Aguilar is a 26 y.o. female who presents [facility] on 02/08/2022 for follow up of endometrial hyperplasia without atypia. Patient with morbid obesity, BMI of 65, and also a history of hyperplasia, status post IUD placement, which overall has been stable and the bleeding has been managed. ?Doing overall very well, and she is trying to do weight losing and exercises. The patient has an IUD placed since 08/10/21. ?Last bx was negativeNo AUBHas RLQ pain intermittently Cancer Staging No matching staging information was found for the patient. PAST HISTORY: Past Medical History: Diagnosis Date ? BMI 60.0-69.9, adult (HC Code) (HC CODE) (HC Code)  ? Cancer (HC Code) (HC CODE) (HC Code) 2012  Right ovarian granulosa cell carcinoma ? Depression 09/20/2012 ? Headache  ? OSA (obstructive sleep apnea) 10/17/2014  CPAP ? PCOS (polycystic ovarian syndrome) 09/20/2012  Past Surgical History: Procedure Laterality Date ? 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 DENTAL SURGERY   ? DILATION AND CURETTAGE OF UTERUS  04/2016  Dr Earnest Conroy ([facility]) ? KNEE ARTHROSCOPY Right 02/2017  acl repair and meniscal repair ? OVARIAN CYST REMOVAL Right 2018  Dr Elenor Quinones- benign  Current Outpatient Medications Medication Sig Dispense Refill ? clindamycin (CLEOCIN T) 1 % Swab Apply topically 2 (two) times daily. 60 each 6 ? selenium sulfide (SELSUN) 2.5 % lotion Apply topically daily as needed for itching. 120 mL 6 ? ubrogepant (UBRELVY) 100 mg tablet Take 1 tablet (100 mg total) by mouth as needed (migraine). 10 tablet 5 No current facility-administered medications for this visit. No Known Allergies Social History Tobacco Use ? Smoking status: Never ? Smokeless tobacco: Never Substance Use Topics ? Alcohol use: Yes   Comment: socially  Family History Problem Relation Age of Onset ? Hypertension Mother  ? Stroke Mother 2      HTN related ? Dementia Paternal Grandmother  ROS:All ROS are negative except as noted in HPI and below :General: no fevers, chills, unexplained weight lossHEENT: no sore throat, neck adenopathyRespiratory: no shortness of breath, dyspnea, hemoptysis or other symptomsCardiac: no palpitations, chest pain or tightnessGastrointestinal: no nausea, vomiting or abdominal painGU: no vaginal bleeding or spotting, no dysuriaMusculoskeletal: no range of motion limitations, joint or muscle painExtremities: no leg swelling or edemaNeurologic: no numbness, tingling,motor weaknessPsychiatric: no depression or mood swingsPHYSICAL EXAM:BP 138/79  - Pulse 70  - Temp 97.6 ?F (36.4 ?C) (Temporal)  - Resp 18  - Wt (!) 184.2 kg  - LMP 01/18/2022  - SpO2 100%  - BMI 65.53 kg/m?  APPEARANCE: Appropriate for age. EYES: Conjunctivae and lids were normal.EARS/NOSE/MOUTH/THROAT: Oropharynx mucosa without erythema, discharge.NECK: Supple. No evidence of thyromegaly. CHEST: Normal respiratory effort. CARDIOVASCULAR: Heart regular rate.LYMPHATICS: Lymph node survey including the supraclavicular, axillary, and inguinal nodes was negative. MUSCULOSKELETAL: The upper and lower extremities had full range of movement.SKIN: No rashes, lesions, or subcutaneous nodules noted. MENTAL STATUS: Oriented to person, place, time. ABDOMEN: Abdomen was soft, non-tender with no masses, hepatosplenomegaly or hernias. There was no CVA tenderness. The bowel sounds were normal. Pelvic exam: limited visibility 2/2 body habitus strings not visualized  ASSESSMENT/PLAN: Gina Aguilar is a 26 y.o. female who presents [facility] on 02/08/2022 for follow up of endometrial hyperplasia without atypia. Patient with morbid obesity, BMI  of 65, and also a history of hyperplasia, status post IUD placement, which overall has been stable and the bleeding has been managed. ?Doing overall very well, and she is trying to do weight losing and exercises. The patient has an IUD placed since 08/10/21.  Last bx negativeRLQ painWill obtain TVUS to assess for adnexal mass? Electronically Signed:Levent Mutlu, MDYale Umatilla HospitalDivision of Gynecologic Oncology2/27/202312:01 PMPatient was seen along with the fellow.  She is a 26 year old female morbidly obese and she had a complex hyperplasia, status post IUD change in biopsy which showed the stromal breakdown with no evidence of hyperplasia.  An IUD was recently changed.  Overall, doing very well.  She has some discomfort.  Examination done, string was seen.  Patient will have a repeat ultrasound in 2 months.  Patient had an ultrasound to make sure there is no reason for her pain and we will call for results.  As long as that is adequate, she will see me in 4 months and will continue followup.  She may need biopsies in future. ?We discussed the weight loss as well.?Electronically Signed by Pricilla Riffle, MD, February 08, 2022??

## 2022-05-03 ENCOUNTER — Telehealth: Admit: 2022-05-03 | Payer: PRIVATE HEALTH INSURANCE | Attending: Gynecologic Oncology | Primary: Adult Health

## 2022-05-03 NOTE — Telephone Encounter
Received call from Pt.Re: [place] and follow-upPt saw Dr. Elenor Quinones 2/27 states the office was supposed to call her to schedule her [place] appt.There is an order in her chart from 2022. Is this the [place] that needs to be scheduled. Please advise.Once [place] is scheduled she also needs a follow-up appt with Dr. Elenor Quinones.

## 2022-05-18 ENCOUNTER — Inpatient Hospital Stay: Admit: 2022-05-18 | Discharge: 2022-05-18 | Payer: MEDICAID | Primary: Adult Health

## 2022-05-18 DIAGNOSIS — R102 Pelvic and perineal pain: Secondary | ICD-10-CM

## 2022-05-18 DIAGNOSIS — N8501 Benign endometrial hyperplasia: Secondary | ICD-10-CM

## 2022-05-18 DIAGNOSIS — N83201 Unspecified ovarian cyst, right side: Secondary | ICD-10-CM

## 2022-07-26 ENCOUNTER — Ambulatory Visit: Admit: 2022-07-26 | Payer: MEDICAID | Attending: Gynecologic Oncology | Primary: Adult Health

## 2022-08-23 ENCOUNTER — Ambulatory Visit: Admit: 2022-08-23 | Payer: MEDICAID | Attending: Gynecologic Oncology | Primary: Adult Health

## 2022-08-30 ENCOUNTER — Ambulatory Visit: Admit: 2022-08-30 | Payer: MEDICAID | Attending: Gynecologic Oncology | Primary: Adult Health

## 2022-08-30 ENCOUNTER — Encounter: Admit: 2022-08-30 | Payer: PRIVATE HEALTH INSURANCE | Attending: Gynecologic Oncology | Primary: Adult Health

## 2022-08-30 DIAGNOSIS — G4733 Obstructive sleep apnea (adult) (pediatric): Secondary | ICD-10-CM

## 2022-08-30 DIAGNOSIS — F32A Depression: Secondary | ICD-10-CM

## 2022-08-30 DIAGNOSIS — Z6841 Body Mass Index (BMI) 40.0 and over, adult: Secondary | ICD-10-CM

## 2022-08-30 DIAGNOSIS — R519 Headache: Secondary | ICD-10-CM

## 2022-08-30 DIAGNOSIS — C801 Malignant (primary) neoplasm, unspecified: Secondary | ICD-10-CM

## 2022-08-30 DIAGNOSIS — E282 Polycystic ovarian syndrome: Secondary | ICD-10-CM

## 2022-08-30 MED ORDER — METRONIDAZOLE 500 MG TABLET
500 mg | ORAL_TABLET | Freq: Two times a day (BID) | ORAL | 1 refills | Status: AC
Start: 2022-08-30 — End: ?

## 2022-08-31 DIAGNOSIS — N8501 Benign endometrial hyperplasia: Secondary | ICD-10-CM

## 2022-09-05 NOTE — Progress Notes
RE:Gina Aguilar: [id number] DOB: Dec 29, 1997Referring Provider: Enriqueta Shutter, [address]  [place] [id number] Care Provider: Heide Guile JeanProvider: Pricilla Riffle, MDDate of Service: 9/18/2023CC: Endometrial hyperplasia without atypia. [id number] [name] is a 26 y.o. female who presents [facility] on 08/30/2022 for follow up of endometrial hyperplasia without atypia. Patient with morbid obesity, BMI of 65, and also a history of hyperplasia, status post IUD placement, which overall has been stable and the bleeding has been managed.  Doing overall very well, and she is trying to do weight losing and exercises. The patient has an IUD placed since 08/10/21. ---------------------------------------------------------------------- Female Pelvis                      (Signed Final 05/18/2022 03:45 pm)----------------------------------------------------------------------PATIENT INFO:? ID #:       [id number]                     D.O.B.:  1996-05-26 (26 yrs)(F) Name:       Gina Aguilar             Visit Date: 05/18/2022 10:50 am----------------------------------------------------------------------PERFORMED BY:? Performed By:     Paris Lore, RDMS Attending:        Enid Derry MD Referred By:      Pricilla Riffle MD Location:         [facility] [address]----------------------------------------------------------------------SERVICE(S) PROVIDED:? US Pelvic and Transvaginal                            [phone] - [id number] 3-D Ultrasound                                        [id number]----------------------------------------------------------------------INDICATIONS:? Pelvic Pain                                    R10.2 Endometrial hyperplasia, unspecified-N85.00    N85.00----------------------------------------------------------------------CLINICAL INFORMATION:? Age:   84              G: 0 LMP:   05/02/22            Day Of Cycle:   17 Hormone Treatment:     Birth control - IUD----------------------------------------------------------------------UTERUS:? Size (cm)    L:  6.3       W:   6.53       H:  3.45 Position:    Anteverted----------------------------------------------------------------------ENDOMETRIUM:? Thickness:   1.8     mm?----------------------------------------------------------------------RIGHT OVARY:? Size(cm):      4.53     x  2.98     x  4.1       Vol(ml):  28.98?----------------------------------------------------------------------RIGHT ADNEXA:? Comment:    No abnormality visualized.----------------------------------------------------------------------LEFT OVARY:? Size(cm):      3.96     x  2.73     x  3.26      Vol(ml):  18.45----------------------------------------------------------------------LEFT ADNEXA:? Comment:    No abnormality visualized.----------------------------------------------------------------------CUL-DE-SAC:? No fluid collections.----------------------------------------------------------------------BLADDER:? Both kidneys seen and normal in appearance----------------------------------------------------------------------COMMENTS:? Thank you for referring your patient for pelvic ultrasound due to RLQ pain. She has a history of abnormal uterine bleeding and endometrial hyperplasia.  Patient's BMI is 69.5 kg/m^2.? She is s/p right ovarian cystectomy in 2012 for a juvenile granulosa cell tumor.  She also has a history of PCOS. Her last menstrual  period was 05/02/22.  She had a Mirena IUD.? Transabdominal and transvaginal ultrasounds were performed.  The uterus is midline, anteverted, with normal contour and texture.  The endometrial thickness is normal at 1.8 mm.  Both ovaries are visualized abdominally and appear normal.  There is minimal vascularity on Doppler interrogation.  There is no free fluid in the cul de sac.? 3D imaging of the coronal plane was used to assess IUD placement and uterine anatomy.    The external and internal contours of the uterus are regular and normal. The IUD is seen  in the lower uterus/upper cervix area.? The pelvic organs move freely when pressure is applied with the vaginal probe (sliding-organ sign). The rectovaginal septum is smooth and adhesive disease is not suspected.  The patient was not tender during the exam?  Crystal Gleason, ACA II was present as a chaperone during transvaginal ultrasound procedure.----------------------------------------------------------------------IMPRESSION:IMPRESSION:? ?        Normal appearing pelvic ultrasound      IUD is low in the LUS/cervix ?        Endometrium above the IUD appears normal. ?        The ovaries are visualized and appear normal.? Recommendations: F/u as clinically indicatedNo results found for: CA125Y, CA125Q, CA125L, CA125BH, CA125GH Cancer Staging No matching staging information was found for the patient.Oncology History  No history exists. Patient Active Problem List Diagnosis ? Juvenile granulosa cell tumor ? PCOS (polycystic ovarian syndrome) ? Morbid obesity with body mass index of 60.0-69.9 in adult [facility] Code) ? Endometrial hyperplasia without atypia, simple ? Obstructive sleep apnea ? Ovarian cyst, right ? Problem with vascular access ? S/P ovarian cystectomy ? S/P ACL reconstruction ? Abnormal uterine bleeding (AUB) ? Abnormal hair growth from preauricular area towards lateral cheekbone ? Pituitary adenoma (HC Code) ? Migraine without aura and without status migrainosus, not intractable ? Seborrheic dermatitis ? Hidradenitis suppurativa PAST MEDICAL HX: Gina Aguilar  has a past medical history of BMI 60.0-69.9, adult (HC Code), Cancer (HC Code) (HC CODE) (HC Code) (2012), Depression (09/20/2012), Headache, OSA (obstructive sleep apnea) (10/17/2014), and PCOS (polycystic ovarian syndrome) (09/20/2012).PAST SURGICAL HX:  She  has a past surgical history that includes Ovarian cyst removal (Right, 2018); Knee arthroscopy (Right, 02/2017); Abdominal surgery (2012); Dilation and curettage of uterus (04/2016); and Dental surgery.SOCIAL HX: Patient  reports that she has never smoked. She has never used smokeless tobacco. She reports current alcohol use. She reports current drug use. Drug: Marijuana.FAMILY HX: Her family history includes Dementia in her paternal grandmother; Hypertension in her mother; Stroke (age of onset: 57) in her mother.ALLERGIES: Patient has No Known Allergies.CURRENT MEDICATIONS: ? clindamycin Apply topically 2 (two) times daily. (Patient not taking: Reported on 08/30/2022) ? Bernita Raisin Take 1 tablet (100 mg total) by mouth as needed (migraine). (Patient not taking: Reported on 08/30/2022) ROS: See HPIPHYSICAL EXAM:BP (!) 143/79  - Pulse 71  - Temp 98.2 ?F (36.8 ?C) (Oral)  - Wt (!) 186.4 kg  - SpO2 100%  - BMI 66.34 kg/m? APPEARANCE: Appropriate for age. Body mass index is 66.34 kg/m?.CHEST: Lungs were clear to auscultation bilaterally. Normal respiratory effort. CARDIOVASCULAR: Heart regular rate and rhythm. No evidence of murmurs.  No lower extremity varicosities were noted. No peripheral edema noted. ABDOMEN: Abdomen was soft, non-tender with no masses, hepatosplenomegaly or hernias.   The bowel sounds were normal. PELVIC EXAM: IUD string seen. Ingrown hair abscess seen on the vulvar areaLABSLab Results Component Value Date  WBC 11.6 (H) 03/28/2020  ANCANC 8.8 (  H) 03/28/2020  HGB 13.4 03/28/2020  HCT 41.3 03/28/2020  MCV 92.4 03/28/2020  PLT 450 03/28/2020 ASSESSMENT/Aguilar:26 y.o. female who presents for follow up of endometrial hyperplasia without atypia. Patient with morbid obesity, BMI of 65, and also a history of hyperplasia, status post IUD placement, which overall has been stable and the bleeding has been managed.  Doing overall very well, and she is trying to do weight losing and exercises. The patient has an IUD placed since 08/10/21. - Advised to have consultation with primary care for weight loss. - Use warm compress for the ingrown hair area. - Ultrasound in 6 months. - Discussed the option for biopsy in the future. - Advised to take flagyl for bacterial vaginosis.- Follow up in 6 months. All of the patient's questions were answered and she was in agreement with this Aguilar.Thank you for your referral of this patient and for allowing [place] to participate in her care.The Aguilar was discussed with Dr. Pricilla Riffle, MD.The patient was seen and examined by Dr. Pricilla Riffle, MD and his fellow. Scribed for Pricilla Riffle, MD by Clent Jacks, medical scribe August 30, 2022  The documentation recorded by the scribe accurately reflects the services I personally performed and the decisions made by me. I reviewed and confirmed all material entered and/or pre-charted by the scribe. Patient was seen and examined along with the fellow.  She is a 26 year old female with a BMI around the morbid obesity category and the patient had a hyperplasia, so the IUD had been placed, recently it was switched and the lining is very thin and no abnormal bleeding.  However, she had tried weight loss and has not been successful and I have referred the patient to primary care physician for evaluation to make sure she is not diabetic and also see if she can work on a weight loss program and the IUD is in place.  We will see her in 6 months with another ultrasound.  At some point, she will need a biopsy.  Also, she has some ingrown hair on the vulvar area.  Instruction of that was also given.  All the questions were answered. ?Thank you very much for the referral.?Electronically Signed by Pricilla Riffle, MD, August 30, 2022?? ?

## 2022-09-05 NOTE — Progress Notes
Re: Gina Aguilar (13-Sep-1996)MRN: [id number] Provider: Pricilla Riffle, MDReferring Provider: Enriqueta Shutter [address],  [place] [id number] Care Provider: Tamala Fothergill of service: 9/18/2023REASON FOR VISIT: 26 year old female with history of simple hyperplasia in 2017 obesity is presenting for follow-up appointment.  She had IUD is since then and lesser it was renewed most recently she underwent transvaginal ultrasound shows endometrial stripe of 1.8 millimeters.Chief Complaint: Encounter Diagnoses Name Primary? ? Endometrial hyperplasia without atypia, simple Yes ? Morbid obesity (HC Code)  INTERIM HISTORY:  Cancer Staging No matching staging information was found for the patient. PAST HISTORY: Past Medical History: Diagnosis Date ? BMI 60.0-69.9, adult (HC Code)  ? Cancer (HC Code) (HC CODE) (HC Code) 2012  Right ovarian granulosa cell carcinoma ? Depression 09/20/2012 ? Headache  ? OSA (obstructive sleep apnea) 10/17/2014  CPAP ? PCOS (polycystic ovarian syndrome) 09/20/2012  Past Surgical History: Procedure Laterality Date ? 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 DENTAL SURGERY   ? DILATION AND CURETTAGE OF UTERUS  04/2016  Dr Earnest Conroy ([facility]) ? KNEE ARTHROSCOPY Right 02/2017  acl repair and meniscal repair ? OVARIAN CYST REMOVAL Right 2018  Dr Elenor Quinones- benign  Current Outpatient Medications Medication Sig Dispense Refill ? clindamycin (CLEOCIN T) 1 % Swab Apply topically 2 (two) times daily. (Patient not taking: Reported on 08/30/2022) 60 each 6 ? metroNIDAZOLE (FLAGYL) 500 mg tablet Take 1 tablet (500 mg total) by mouth 2 (two) times daily for 7 days. 30 tablet 0 ? ubrogepant (UBRELVY) 100 mg tablet Take 1 tablet (100 mg total) by mouth as needed (migraine). (Patient not taking: Reported on 08/30/2022) 10 tablet 5 No current facility-administered medications for this visit. No Known Allergies Social History Tobacco Use ? Smoking status: Never ? Smokeless tobacco: Never Substance Use Topics ? Alcohol use: Yes   Comment: socially  Family History Problem Relation Age of Onset ? Hypertension Mother  ? Stroke Mother 65      HTN related ? Dementia Paternal Grandmother  ROS:All ROS are negative except as noted in HPI and below :General: no fevers, chills, unexplained weight lossHEENT: no sore throat, neck adenopathyRespiratory: no shortness of breath, dyspnea, hemoptysis or other symptomsCardiac: no palpitations, chest pain or tightnessGastrointestinal: no nausea, vomiting or abdominal painGU: no vaginal bleeding or spotting, no dysuriaMusculoskeletal: no range of motion limitations, joint or muscle painExtremities: no leg swelling or edemaNeurologic: no numbness, tingling,motor weaknessPsychiatric: no depression or mood swingsPHYSICAL EXAM:BP (!) 143/79  - Pulse 71  - Temp 98.2 ?F (36.8 ?C) (Oral)  - Wt (!) 186.4 kg  - SpO2 100%  - BMI 66.34 kg/m?  APPEARANCE: Appropriate for age. EYES: Conjunctivae and lids were normal.EARS/NOSE/MOUTH/THROAT: Oropharynx mucosa without erythema, discharge.NECK: Supple. No evidence of thyromegaly. CHEST: Normal respiratory effort. CARDIOVASCULAR: Heart regular rate.LYMPHATICS: Lymph node survey including the supraclavicular, axillary, and inguinal nodes was negative. MUSCULOSKELETAL: The upper and lower extremities had full range of movement.SKIN: No rashes, lesions, or subcutaneous nodules noted. MENTAL STATUS: Oriented to person, place, time. ABDOMEN: Abdomen was soft, non-tender with no masses, hepatosplenomegaly or hernias. There was no CVA tenderness. The bowel sounds were normal.   ASSESSMENT/PLAN: 26 year old female with simple hyperplasia status post IUD placement is presenting for management recommendations.  Ultrasound is normal at this time and will continue to follow-up conservatively with endometrial sampling prn Electronically Signed:Baneen Wieseler, MDYale Marshfield Hills HospitalDivision of Gynecologic Oncology9/18/20237:03 PMPatient was seen and examined along with the fellow.  She is a 26 year old female  with a BMI around the morbid obesity category and the patient had a hyperplasia, so the IUD had been placed, recently it was switched and the lining is very thin and no abnormal bleeding.  However, she had tried weight loss and has not been successful and I have referred the patient to primary care physician for evaluation to make sure she is not diabetic and also see if she can work on a weight loss program and the IUD is in place.  We will see her in 6 months with another ultrasound.  At some point, she will need a biopsy.  Also, she has some ingrown hair on the vulvar area.  Instruction of that was also given.  All the questions were answered. ?Thank you very much for the referral.?Electronically Signed by Pricilla Riffle, MD, August 30, 2022??

## 2023-03-28 ENCOUNTER — Encounter: Admit: 2023-03-28 | Payer: PRIVATE HEALTH INSURANCE | Attending: Family | Primary: Adult Health

## 2023-03-28 DIAGNOSIS — N8501 Benign endometrial hyperplasia: Secondary | ICD-10-CM

## 2023-04-08 ENCOUNTER — Inpatient Hospital Stay: Admit: 2023-04-08 | Discharge: 2023-04-08 | Payer: MEDICAID | Primary: Adult Health

## 2023-04-08 DIAGNOSIS — N8501 Benign endometrial hyperplasia: Secondary | ICD-10-CM

## 2023-04-18 ENCOUNTER — Ambulatory Visit: Admit: 2023-04-18 | Payer: MEDICAID | Attending: Gynecologic Oncology | Primary: Adult Health

## 2023-04-18 DIAGNOSIS — N8501 Benign endometrial hyperplasia: Secondary | ICD-10-CM

## 2023-04-24 NOTE — Progress Notes
A video visit with the patient.  She is a 27 year old female with the obesity with the hyperplasia.  IUD is in place with EMT of only 3 mm.  She still has regular menses but not heavy.  Overall, doing very well and she is trying to undergo gastric surgery for weight loss which will help significantly.  Finding and ultrasound was reviewed and discussed.  All the questions were answered.  She will have an ultrasound in six months.  At that point, she will see me.  We will discuss the results and possible biopsy, if needed. Thank you very much for the referral.Electronically Signed by Pricilla Riffle, MD, Apr 18, 2023

## 2023-04-24 NOTE — Progress Notes
This clinician is part of the telehealth program and is conducting this visit in a currently approved location. For this visit the clinician and patient were present via interactive audio & video telecommunications system that permits real-time communications.Patient consent given for video visit: YesState patient is located in: CTThe clinician is appropriately licensed in the above state to provide care for this visit.Other individuals present during the telehealth encounter and their role/relation: noneIf billing based on time, please complete (Not required if billing based on MDM):                           Total time spent in medical video consultation:    Total time spent by the provider on the day of service, which includes time spent on chart review, medical video consultation, education, coordination of care/services and counseling Because this visit was completed over video, a hands-on physical exam was not performed.  Patient understands and knows to call back if condition changes. TD:DUKGUR R Secord MR: [id number] DOB: Nov 19, 1996 Referring Provider: Michaelle Birks [address] [place],  [place] [address] Primary Care Provider: Enriqueta Shutter Provider: Dr. Pricilla Riffle, MD Date of Service: 04/18/2023 CC: New Patient HPI: Gina Aguilar is a 27 y.o. female who [facility]18[facility]2023 for follow up of BMI morbid obesity category and simple hyperplasia. Ultrasound was requested with results below. Today, 04/18/2023, she has regular heavy menstrual bleeding lasting for 5-6 days. She has cramps as well. She denies lightheadedness. 04/08/2023 [place] Non OB transvaginal FINDINGS: Limited examination due to body habitus and gas Uterus: Measures 7.8 x 3.5 x 5.1 cm. The uterus demonstrates normal in size and echotexture. The endometrium measures up to 3 mm. There is an IUD seen, once again in the lower uterine segment. Right Ovary:  Measures  3.0 x 1.8 x 1.4 cm. The ovary is unremarkable. There is normal vascularity. Left Ovary:  Measures 4.6 x 2.9 x 3.0 cm. The ovary is unremarkable. There is normal vascularity. Peritoneum: There is small volume free fluid seen in the cul-de-sac.  IMPRESSION: IUD visualized within the lower uterine segment. Otherwise, unremarkable. Patient Active Problem List Diagnosis  Juvenile granulosa cell tumor  PCOS (polycystic ovarian syndrome)  Morbid obesity with body mass index of 60.0-69.9 in adult [facility] Code)  Endometrial hyperplasia without atypia, simple  Obstructive sleep apnea  Ovarian cyst, right  Problem with vascular access  S/P ovarian cystectomy  S/P ACL reconstruction  Abnormal uterine bleeding (AUB)  Abnormal hair growth from preauricular area towards lateral cheekbone  Pituitary adenoma (HC Code)  Migraine without aura and without status migrainosus, not intractable  Seborrheic dermatitis  Hidradenitis suppurativa    ECOG Performance Status: 0 Fully active, able to carry on all pre-disease performance without restriction 1 Restricted in physically strenuous activity but ambulatory and able to carry out work of a light or sedentary nature, e.g., light housework, office work 2 Ambulatory and capable of all self-care but unable to carry out any work activities. Up and about more than 50% of waking hours 3 Capable of only limited self-care, confined to bed or chair more than 50% of waking hours 4 Completely disabled, cannot carry on any self-care. Totally confined to bed or chair. PAST MEDICAL HX: Elysse  has a past medical history of BMI 60.0-69.9, adult (HC Code), Cancer (HC Code) (HC CODE) (HC Code) (2012), Depression (09/20/2012), Headache, OSA (obstructive sleep apnea) (10/17/2014), and PCOS (polycystic  ovarian syndrome) (09/20/2012). PAST SURGICAL HX:  She  has a past surgical history that includes Ovarian cyst removal (Right, 2018); Knee arthroscopy (Right, 02/2017); Abdominal surgery (2012); Dilation and curettage of uterus (04/2016); and Dental surgery. SOCIAL HX: Patient  reports that she has never smoked. She has never used smokeless tobacco. She reports current alcohol use. She reports current drug use. Drug: Marijuana. FAMILY HX: Her family history includes Dementia in her paternal grandmother; Hypertension in her mother; Stroke (age of onset: 43) in her mother. ALLERGIES: Patient has No Known Allergies.   CURRENT MEDICATIONS:   clindamycin Apply topically 2 (two) times daily. (Patient not taking: Reported on 08/30/2022)  Bernita Raisin Take 1 tablet (100 mg total) by mouth as needed (migraine). (Patient not taking: Reported on 08/30/2022)   ROS: See HPI   PHYSICAL EXAM: ?Physical exam was not done because the patient was seen over video. ?Prescriptions written this visit: Requested Prescriptions  No prescriptions requested or ordered in this encounter    LABS Lab Results Component Value Date  WBC 11.6 (H) 03/28/2020  ANCANC 8.8 (H) 03/28/2020  HGB 13.4 03/28/2020  HCT 41.3 03/28/2020  MCV 92.4 03/28/2020  PLT 450 03/28/2020  No results found for requested labs within last 7 days.   ASSESSMENT/PLAN: Gina Aguilar is a 27 y.o. female here for follow up of simple endometrial hyperplasia. Her ultrasound on 04/08/2023 shows a visualized IUD within the lower uterine segment and otherwise unremarkable.  I assured her that her IUD is low lying but still in place. -schedule for a repeat TVUS in 6 months -discussed that depending on her scan, we may do biopsy in the future - RTC in 6 months.  All of the patient's questions were answered and she was in agreement with this plan.   The patient was seen and examined by Dr. Pricilla Riffle, MD. Scribed for Pricilla Riffle, MD by Rudean Curt, medical scribe Apr 18, 2023 The documentation recorded by the scribe accurately reflects the services I personally performed and the decisions made by me. I reviewed and confirmed all material entered and/or pre-charted by the scribe.A video visit with the patient.  She is a 27 year old female with the obesity with the hyperplasia.  IUD is in place with EMT of only 3 mm.  She still has regular menses but not heavy.  Overall, doing very well and she is trying to undergo gastric surgery for weight loss which will help significantly.  Finding and ultrasound was reviewed and discussed.   All the questions were answered.  She will have an ultrasound in six months.  At that point, she will see me.  We will discuss the results and possible biopsy, if needed.  Thank you very much for the referral. Electronically Signed by Pricilla Riffle, MD, Apr 18, 2023

## 2023-06-08 ENCOUNTER — Emergency Department: Admit: 2023-06-08 | Payer: MEDICAID | Primary: Adult Health

## 2023-06-08 ENCOUNTER — Ambulatory Visit: Admit: 2023-06-08 | Payer: MEDICAID | Primary: Adult Health

## 2023-06-08 ENCOUNTER — Inpatient Hospital Stay: Admit: 2023-06-08 | Discharge: 2023-06-08 | Payer: MEDICAID | Attending: Emergency Medicine | Primary: Adult Health

## 2023-06-08 DIAGNOSIS — R220 Localized swelling, mass and lump, head: Secondary | ICD-10-CM

## 2023-06-08 LAB — CBC WITH AUTO DIFFERENTIAL
BKR WAM ABSOLUTE IMMATURE GRANULOCYTES.: 0.05 x 1000/ÂµL (ref 0.00–0.30)
BKR WAM ABSOLUTE LYMPHOCYTE COUNT.: 1.34 x 1000/ÂµL (ref 0.60–3.70)
BKR WAM ABSOLUTE NRBC (2 DEC): 0 x 1000/ÂµL (ref 0.00–1.00)
BKR WAM ANALYZER ANC: 6.52 x 1000/ÂµL (ref 2.00–7.60)
BKR WAM BASOPHIL ABSOLUTE COUNT.: 0.02 x 1000/ÂµL (ref 0.00–1.00)
BKR WAM BASOPHILS: 0.2 % (ref 0.0–1.4)
BKR WAM EOSINOPHIL ABSOLUTE COUNT.: 0.08 x 1000/ÂµL (ref 0.00–1.00)
BKR WAM EOSINOPHILS: 0.9 % (ref 0.0–5.0)
BKR WAM HEMATOCRIT (2 DEC): 38.3 % (ref 35.00–45.00)
BKR WAM HEMOGLOBIN: 13.4 g/dL (ref 11.7–15.5)
BKR WAM IMMATURE GRANULOCYTES: 0.5 % (ref 0.0–1.0)
BKR WAM LYMPHOCYTES: 14.3 % — ABNORMAL LOW (ref 17.0–50.0)
BKR WAM MCH (PG): 31.3 pg (ref 27.0–33.0)
BKR WAM MCHC: 35 g/dL (ref 31.0–36.0)
BKR WAM MCV: 89.5 fL (ref 80.0–100.0)
BKR WAM MONOCYTE ABSOLUTE COUNT.: 1.34 x 1000/ÂµL (ref 0.00–1.00)
BKR WAM MONOCYTES: 14.3 % — ABNORMAL HIGH (ref 4.0–12.0)
BKR WAM MPV: 9.9 fL (ref 8.0–12.0)
BKR WAM NEUTROPHILS: 69.8 % (ref 39.0–72.0)
BKR WAM NUCLEATED RED BLOOD CELLS: 0 % (ref 0.0–1.0)
BKR WAM PLATELETS: 298 x1000/ÂµL (ref 150–420)
BKR WAM RDW-CV: 14.5 % (ref 11.0–15.0)
BKR WAM RED BLOOD CELL COUNT.: 4.28 M/ÂµL (ref 4.00–6.00)
BKR WAM WHITE BLOOD CELL COUNT: 9.4 x1000/ÂµL (ref 4.0–11.0)

## 2023-06-08 LAB — BASIC METABOLIC PANEL
BKR ANION GAP: 15 (ref 7–17)
BKR BLOOD UREA NITROGEN: 7 mg/dL (ref 6–20)
BKR BUN / CREAT RATIO: 12.3 % (ref 8.0–23.0)
BKR CALCIUM: 9.5 mg/dL (ref 8.8–10.2)
BKR CHLORIDE: 98 mmol/L (ref 98–107)
BKR CO2: 23 mmol/L (ref 20–30)
BKR CREATININE: 0.57 mg/dL (ref 0.40–1.30)
BKR EGFR, CREATININE (CKD-EPI 2021): >60 mL/min/{1.73_m2} (ref >=60–12.0)
BKR GLUCOSE: 81 mg/dL (ref 70–100)
BKR POTASSIUM: 2.8 mmol/L (ref 3.3–5.3)
BKR SODIUM: 136 mmol/L (ref 136–144)

## 2023-06-08 LAB — FOLATE: BKR FOLATE: 5.4 ng/mL

## 2023-06-08 LAB — VITAMIN B12: BKR VITAMIN B12: 737 pg/mL (ref 232–1245)

## 2023-06-08 MED ORDER — LIDOCAINE HCL 2 % MUCOSAL SOLUTION
2 % | Freq: Once | ORAL | Status: DC
Start: 2023-06-08 — End: 2023-06-09

## 2023-06-08 MED ORDER — MORPHINE 4 MG/ML INTRAVENOUS SOLUTION
4 mg/mL | Freq: Once | SUBCUTANEOUS | Status: DC
Start: 2023-06-08 — End: 2023-06-09

## 2023-06-08 MED ORDER — POTASSIUM CHLORIDE ER 20 MEQ TABLET,EXTENDED RELEASE(PART/CRYST)
20 MEQ | Freq: Once | ORAL | Status: DC
Start: 2023-06-08 — End: 2023-06-08

## 2023-06-08 MED ORDER — SODIUM CHLORIDE 0.9 % BOLUS (NEW BAG)
0.9 % | Freq: Once | INTRAVENOUS | Status: CP
Start: 2023-06-08 — End: ?
  Administered 2023-06-08: 21:00:00 0.9 mL/h via INTRAVENOUS

## 2023-06-08 MED ORDER — AMOXICILLIN 875 MG-POTASSIUM CLAVULANATE 125 MG TABLET
875-125 mg | Freq: Once | ORAL | Status: CP
Start: 2023-06-08 — End: ?
  Administered 2023-06-08: 875-125 mg via ORAL

## 2023-06-08 MED ORDER — PANTOPRAZOLE IV PUSH 40 MG VIAL & NS (ADULTS)
Freq: Once | INTRAVENOUS | Status: DC
Start: 2023-06-08 — End: 2023-06-08

## 2023-06-08 MED ORDER — IBUPROFEN 600 MG TABLET
600 mg | ORAL_TABLET | Freq: Four times a day (QID) | ORAL | 1 refills | Status: AC | PRN
Start: 2023-06-08 — End: ?

## 2023-06-08 MED ORDER — IOHEXOL 350 MG IODINE/ML INTRAVENOUS SOLUTION
350 mg iodine/mL | Freq: Once | INTRAVENOUS | Status: CP | PRN
Start: 2023-06-08 — End: ?
  Administered 2023-06-08: 22:00:00 350 mL via INTRAVENOUS

## 2023-06-08 MED ORDER — AMOXICILLIN 875 MG-POTASSIUM CLAVULANATE 125 MG TABLET
875-125 mg | ORAL_TABLET | Freq: Two times a day (BID) | ORAL | 1 refills | Status: AC
Start: 2023-06-08 — End: ?

## 2023-06-08 MED ORDER — POTASSIUM BICARBONATE-CITRIC ACID 20 MEQ EFFERVESCENT TABLET
20 mEq | Freq: Once | ORAL | Status: CP
Start: 2023-06-08 — End: ?
  Administered 2023-06-08: 19:00:00 20 mEq via ORAL

## 2023-06-08 NOTE — Unmapped
4:12 PM

## 2023-06-08 NOTE — Unmapped
53:23 PM 27 year old female with hx of gastric bypass presents to the ED for evaluation of oral pain and swelling x 1 week. Pt has an open sore to roof of mouth since having an endoscopy done a week ago. Reports painful eating and swallowing. Pt alert/oriented x 3, in no acute distress. Awaiting provider for eval.

## 2023-06-08 NOTE — Unmapped
5:34 PM told infusion she still needs 2 labs vit b12 and folate

## 2023-06-08 NOTE — Unmapped
Take 1 tablet of Augmentin twice a day for the next week. Take 600 milligrams of ibuprofen every 6 hours, 500 to 1000 milligrams of acetaminophen every 6 hours for pain.  Maintain a diet of soft foods, low in acidity. Follow up with ear nose and throat, oral maxillofacial surgery for the next available appointments.  Return to the emergency department as needed for worsening symptoms.

## 2023-06-08 NOTE — Unmapped
5:09 PM Received patient for  ivf bolus. Warmed blanket given for comfort and call bell for  Safety. Will ctm patient.6:25 PMPt's IVF bolus completed. Pt moved over to IV stick for a reattempt to draw further labs now that pt has completed IVF bolus.

## 2023-06-08 NOTE — Unmapped
2:32 PM

## 2023-06-09 DIAGNOSIS — Z6841 Body Mass Index (BMI) 40.0 and over, adult: Secondary | ICD-10-CM

## 2023-06-09 DIAGNOSIS — K121 Other forms of stomatitis: Secondary | ICD-10-CM

## 2023-06-09 NOTE — Unmapped
7:58 PM Patient medicated with Augmentin 875-125 mg tab. Provider updating patient at bedside.  8:04 PM PIV removed. Discharge instructions and paper work given to patient.

## 2023-06-09 NOTE — Unmapped
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 Pt reports pain at the roof of her mouth since her endoscopy 1 week ago. Had an endoscopy because she was having persistent N/V constipation after her bypass surgery. Reports N/V now improved, has been able to eta and drink and take her meds however swallowing or eating does cause pain at the roof of her mouth. Saw her orthodontist today because she had braces and just had a check up, they saw the roof of her mouth and recommend she present here for further evaluation. Denies abd pain, fevers, chills, melena, hx of tobacco sue, or other complaints at this time. MDM:A&P27 yo F p/w pain in the roof of her mouth since an endoscopy 1 week ago. There is a  hard palate defect with surrounding ulceration and some purulent drainage and possibly exposed bone on exam - see photos in media tab. Ddx includes congential torus palatinus, infectious process, malignancy. Will obtain basic labs, [facility] imaging, and reassess.Zarahi Fuerst PAReceived sign-out on this patient.  See media tab for photo of lesion shows (Osseous and mucosal irregularity of the hard palate of uncertain etiology. Differential includes infection/inflammation/atypical torus palatinus with superimposed inflammation. No discrete soft tissue mass or drainable collection. Patient was tolerating p.o..  Was prescribed course of Augmentin, instructed soft, low SUV diet.  Gave contact information for primary care, ENT, OMFS, advised calling tomorrow for the next available appointment.  Strict return precautions for worsening symptoms.  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  Patient expressed understanding of all instructions and agreed with plan.  All questions were addressed and answered.An acute or life threatening problem was considered during this evaluation  A decision regarding hospitalization was made during this visit  External data reviewed: Notes (OSH or non-ED)Directly spoke with:  Consultant  Physical ExamED Triage VitalsBP: 92/64 [06/08/23 1252]Pulse: (!) 94 [06/08/23 1231]Pulse from  O2 sat: n/aResp: 20 [06/08/23 1231]Temp: 98.9 ?F (37.2 ?C) [06/08/23 1231]Temp src: Oral [06/08/23 1231]SpO2: 97 % [06/08/23 1231] BP 134/83  - Pulse 75  - Temp 98.1 ?F (36.7 ?C)  - Resp 18  - Wt (!) 145 kg (319 lb 10.7 oz)  - SpO2 100%  - BMI 51.60 kg/m? Physical ExamVitals and nursing note reviewed. Constitutional:     Appearance: Normal appearance. She is obese. HENT:    Head: Normocephalic and atraumatic.    Right Ear: External ear normal.    Left Ear: External ear normal.    Nose: Nose normal.    Mouth/Throat:    Mouth: Mucous membranes are moist.    Comments: There is a hard palate defect with surrounding ulceration and some purulent drainage - see photos in media tab. Cardiovascular:    Rate and Rhythm: Normal rate and regular rhythm.    Pulses: Normal pulses.    Heart sounds: Normal heart sounds. Pulmonary:    Effort: Pulmonary effort is normal. No respiratory distress.    Breath sounds: Normal breath sounds. No wheezing, rhonchi or rales. Chest:    Chest wall: No tenderness. Abdominal:  General: Abdomen is flat. There is no distension.    Palpations: Abdomen is soft.    Tenderness: There is no abdominal tenderness. There is no right CVA tenderness, left CVA tenderness, guarding or rebound. Musculoskeletal:       General: Normal range of motion.    Cervical back: Normal range of motion and neck supple. Skin:   General: Skin is warm and dry. Neurological:    General: No focal deficit present.    Mental Status: She is alert and oriented to person, place, and time.    Cranial Nerves: No cranial nerve deficit.    Motor: No weakness. Psychiatric:       Mood and Affect: Mood normal.       Behavior: Behavior normal.  ProceduresAttestation/Critical CareComments as of 06/08/23 2007 Wed Jun 08, 2023 1444 XR Neck Soft TissueNegative study. [facility] [facility]: Routine. [AS] 1542 Pt's GI team from [facility] called to discuss, spoke with NP Verlon Setting who recommend administering Banana bag, obtaining B12 Thiamine folate levels, and giving IV PPI - [phone] [AS] 1606 Pt declined IV PPI, takes she has been taking all of her meds at home including her omeprazole [AS]  Comments User Index[AS] Charlotte Crumb, PA   Clinical Impressions as of 06/08/23 2007 Hard palate ulcer  SEEN with another provider. Hx and exam confirmed. I performed key portions of the clinical exam and participated in patient management. Agree with plan. On my exam, patient is nontoxic, with no respiratory distress. Pt appears clinically stable. -- JSavageED DispositionDischarge Antony Madura, MD06/26/24 1452 Charlotte Crumb, PA06/26/24 1907 Lorin Picket, PA06/26/24 2007

## 2023-06-20 ENCOUNTER — Telehealth: Admit: 2023-06-20 | Payer: PRIVATE HEALTH INSURANCE | Primary: Adult Health

## 2023-06-20 DIAGNOSIS — K1379 Other lesions of oral mucosa: Secondary | ICD-10-CM

## 2023-06-20 MED ORDER — AMOXICILLIN 875 MG TABLET
875 | Freq: Two times a day (BID) | ORAL | Status: AC
Start: 2023-06-20 — End: ?

## 2023-06-20 NOTE — Telephone Encounter
Called patient to abstract for appointment with Dr. Richardo Priest on 7/9Reason for referral: Referred by [facility] ED for white spots in mouth HPI: Gina Aguilar is a 27 y.o. female who presented to [facility] ED with complaints of oral pain and swelling that has persisted for approximately 3 weeks. She reports that she had an endoscopy performed due to nausea, vomiting and constipation after having her bypass surgery, symptoms have improved though she continues to have oral pain. She reports that she has pain while eating and swallowing. She was seen at her orthodontist for follow up on her braces when they noticed white spots on her hard palate and advised her to be seen in the ED.  She was discharged from the ED with instructions to follow a soft food diet and prescribed Augmentin.  She reports having trouble swallowing the Augmentin so she hasn't been able to take it due to the pain. She reports that the sore has begun draining and reports that she used scissors to open the sore due to the pain and states that she now has two holes in the roof of her mouth. She complains of having bad breath and not being able to socialize at work. Patient understanding re: reason for referral: Dr. Richardo Priest is going to figure out what's going on Imaging: 	XR Neck Soft Tissue 6/26		Impression:			Negative study.	Ross Facial Bones w/ IV Contrast 6/26		Impression:			Osseous and mucosal irregularity of the hard palate of uncertain etiology. Differential includes infection/inflammation/atypical torus palatinus with superimposedinflammation. No discrete soft tissue mass or drainable collection. Correlate with direct visualization and tissue sampling. Pathology:	N/APast Medical History: Diagnosis Date  BMI 60.0-69.9, adult (HC Code)   Cancer (HC Code) (HC CODE) (HC Code) 2012  Right ovarian granulosa cell carcinoma  Depression 09/20/2012  Headache   OSA (obstructive sleep apnea) 10/17/2014  CPAP  PCOS (polycystic ovarian syndrome) 09/20/2012  Past Surgical History: Procedure Laterality Date  ABDOMINAL SURGERY  2012  Laparotomy, right ovarian cystectomy- Juvenile granulosa cell carcinoma (Gyn Onc)  DENTAL SURGERY    DILATION AND CURETTAGE OF UTERUS  04/2016  Dr Earnest Conroy ([facility])  KNEE ARTHROSCOPY Right 02/2017  acl repair and meniscal repair  OVARIAN CYST REMOVAL Right 2018  Dr Elenor Quinones- benign  Location: Hard PalateTiming:  June 2024Pain: yes- slight discomfort Aggravating Factors:  eating & swallowing Relieving Factors: nothing Trimus:  noMouth Pain: yesDyspnea:   noDysphagia: yesOdynophagia: yesCough: noHemoptysis:  noVoice changes/hoarseness: noNeck Tightness: noOtalgia:yes- Right ear fullness Sore throat:  noFever, night sweats, chills:  noFatigue:  yes- has issues sleeping at night Weight loss: yes- recent gastric bypass  Prior Hx of Head Neck Cancer: no Prior Hx of other cancer: noPrior Hx of thyroid disease: no Family history of Head Neck cancer: yes- thyroid cancer (paternal aunt)Family history of thyroid disease:  no Prior history of head and neck surgery:   no  Prior History of radiation therapy:  noPrior history of radiation exposure:  no Smoking History: No (smoked marijuana)  Alcohol:neverSubstance abuse: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  Also, explained although I am not sure what will be recommended at this time since they have not see the doctor yet that if they are having any surgical procedure they will need to stop all supplements/blood thinners 7-10 days prior and should avoid all blood thinners during that time.  If on a  prescription blood thinner instructed they should follow-up with the ordering doctor.

## 2023-06-21 ENCOUNTER — Ambulatory Visit: Admit: 2023-06-21 | Payer: MEDICAID | Attending: Surgery | Primary: Adult Health

## 2023-06-21 DIAGNOSIS — K118 Other diseases of salivary glands: Secondary | ICD-10-CM

## 2023-06-21 DIAGNOSIS — R1311 Dysphagia, oral phase: Secondary | ICD-10-CM

## 2023-06-22 ENCOUNTER — Encounter: Admit: 2023-06-22 | Payer: PRIVATE HEALTH INSURANCE | Attending: Surgery | Primary: Adult Health

## 2023-06-24 ENCOUNTER — Encounter: Admit: 2023-06-24 | Payer: PRIVATE HEALTH INSURANCE | Attending: Surgery | Primary: Adult Health

## 2023-06-24 DIAGNOSIS — G4733 Obstructive sleep apnea (adult) (pediatric): Secondary | ICD-10-CM

## 2023-06-24 DIAGNOSIS — R519 Headache: Secondary | ICD-10-CM

## 2023-06-24 DIAGNOSIS — Z6841 Body Mass Index (BMI) 40.0 and over, adult: Secondary | ICD-10-CM

## 2023-06-24 DIAGNOSIS — F32A Depression: Secondary | ICD-10-CM

## 2023-06-24 DIAGNOSIS — E282 Polycystic ovarian syndrome: Secondary | ICD-10-CM

## 2023-06-24 DIAGNOSIS — C801 Malignant (primary) neoplasm, unspecified: Secondary | ICD-10-CM

## 2023-06-24 NOTE — Progress Notes
CHIEF COMPLAINT:  White spots in mouth. HISTORY OF PRESENT ILLNESS:  Gina Aguilar is a 27 y.o. female referred by Dr. Orlie Dakin Emergency Dept for evaluation and management of the above chief complaint.Gina Aguilar is a 27 y.o. female who presented to [facility] ED with complaints of oral pain and swelling that has persisted for approximately 3 weeks. She reports that she had an endoscopy on 05/31/23 performed due to nausea, vomiting and constipation after having her gastric bypass surgery, symptoms have improved though she continues to have oral pain. She reports that she has pain while eating and swallowing. She was seen at her orthodontist for follow up on her braces when they noticed white spots on her hard palate and advised her to be seen in the ED.  She was discharged from the ED with instructions to follow a soft food diet and prescribed Augmentin.  She reports having trouble swallowing the Augmentin so she hasn't been able to take it due to the pain. She reports that the sore has begun draining and reports that she used scissors to open the sore due to the pain and states that she now has two holes in the roof of her mouth. She complains of having bad breath and not being able to socialize at work. Location: BilateralQuality:  WorseningTiming/Duration: 3 weeks. Modifying Factors: None.Associated Signs and Symptoms: Trouble swallowing, pain.REVIEW OF SYSTEMS:  A comprehensive twelve system questionnaire was completed by the patient and I personally reviewed it with the patient.  Pertinent positives and negatives are noted in the history above.PAST MEDICAL HISTORY:  Gina Aguilar has a past medical history of BMI 60.0-69.9, adult (HC Code), Cancer (HC Code) (HC CODE) (HC Code) (2012), Depression (09/20/2012), Headache, OSA (obstructive sleep apnea) (10/17/2014), and PCOS (polycystic ovarian syndrome) (09/20/2012).PAST SURGICAL HISTORY:  Gina Aguilar has a past surgical history that includes Ovarian cyst removal (Right, 2018); Knee arthroscopy (Right, 02/2017); Abdominal surgery (2012); Dilation and curettage of uterus (04/2016); and Dental surgery.MEDICATIONS:  Reviewed and noted in EMR.ALLERGIES:   Patient has no known allergies.SOCIAL HISTORY:  Gina Aguilar  reports that she has never smoked. She has never used smokeless tobacco. She reports current alcohol use. She reports current drug use. Drug: Marijuana.Gina Aguilar is SingleFAMILY HISTORY:  Gina Aguilar family history includes Dementia in her paternal grandmother; Hypertension in her mother; Stroke (age of onset: 63) in her mother.   Additional details are in the EMR and have been reviewed.PHYSICAL EXAMINATION:Vital Signs:Vitals:  06/21/23 1352 BP: 124/60 Pulse: 68 Resp: 17 Wt Readings from Last 3 Encounters: 06/08/23 (!) 145 kg 08/30/22 (!) 186.4 kg 02/08/22 (!) 184.2 kg General:  NAD, conversantEyes:  Anicteric sclerae, moist conjunctivae, no lid-lag, PER, EOMIHead:  atramautic, normocephalicEars: external ears WNLNose: external nose WNLMouth: Hard palate with symmetric peri-midline linear ulceration with peeling edges and exudate within the base of the ulcerations, 3cm x 5 mm each. Cranial Nerves:  Cranial nerves III-XII are intact bilaterallyNeck: symmetric, trachea midline, FROM,  No evidence of mass or crepitus.  No lymphadenopathy.Respiratory:   Inspection of chest including symmetry, expansion, and assessment of respiratory effort is normal.Cardiovascular:  Evaluation of peripheral vascular system by observation and palpation of capillary refill is normal, and peripheral pulses are present in upper extremities.Abdomen:  Soft, non-tender, no masses,  No G-tubeExtremities: No peripheral edema or extremity lymphadenopathy.  Skin:  Normal temperature, turgor, and texture, no rashNeurologic:   Alert and oriented, mood and affect are normal to time and  place (Photo from 06/08/23)(Photo from 06/21/23)PROCEDURE:06/21/23:Indication: Need to visualize full extent of nasal cavity beyond what is visible on anterior rhinoscopy and unable to tolerate indirect mirror laryngoscopy due to gag. Anesthetic and decongestant applied. Informed verbal consent is obtained, the 0 degree endoscope is passed via the left nasal cavity. The inferior turbinate is within normal limits. The osteomeatal complex is clear with no polyposis or purulence. The sphenoethmoidal recess is clear with no polyposis or purulence. The superior meatus is clear.  The superior turbinate is within normal limits. The choana is patent.  The 0 degree endoscope is passed via the right nasal cavity. The inferior turbinate is within normal limits. The osteomeatal complex is clear with no polyposis or purulence. The sphenoethmoidal recess is clear with no polyposis or purulence. The superior turbinate is within normal limits.  The superior meatus is clear. The choana is patent.  The septum is midline. The mucosa is in pink and healthy. No significant mucus. The scope was then advanced to evaluate the oropharynx and larynx. The oropharynx with without masses or lesions. the Supraglottis was within normal limits. Bilateral arytenoids and post-cricoid region, interarytenoid area within normal limits. Pyriform sinuses clear. Bilateral and symmetric movement of the vocal folds bilaterally, no masses or lesions. Patient tolerated procedure well.DATA REVIEW:  Relevant images and reports personally reviewed by me.Imaging Reviewed:[place] Facial Bones w IV ContrastResult Date: 6/26/2024CT FACIAL BONES W IV CONTRAST INDICATION: hard palate infection/congenital anomaly COMPARISON: [place] HEAD WO IV CONTRAST 2020-03-28 TECHNIQUE: Axial [place] images of the facial bones were performed without the administration of intravenous contrast.  Sagittal and coronal reformats were provided. FINDINGS: The hard palate appears irregular with areas of thinning/erosion as well as hyperostosis. There is submucosal edema without a discrete mass or drainable collection.Limited evaluation on [place]. There is slight streak artifact from braces. No acute fracture of the facial bones. TM joints are intact. Mild mucosal thickening in the paranasal sinuses without air-fluid levels. Mastoids are clear. Visualized portions of the orbits, upper cervical spine and intracranial structures appear unremarkable. Osseous and mucosal irregularity of the hard palate of uncertain etiology. Differential includes infection/inflammation/atypical torus palatinus with superimposed inflammation. No discrete soft tissue mass or drainable collection. Correlate with direct visualization and tissue sampling. [facility] Notify System Classification: Routine. Report initiated by: Armando Gang, MD Reported and signed by: Etheleen Nicks, MD XR Neck Soft TissueResult Date: 6/26/2024CLINICAL INFORMATION: Difficulty swallowing after upper EGD. TECHNIQUE: Xr Neck Soft Tissue. COMPARISON: None. FINDINGS: Bones:  No acute osseous abnormalities. Soft tissues:  Unremarkable.  Additional Comments: None.   Negative study. [facility]: Routine. Reported and signed by: Amie Critchley, MD XR Chest Special 1 or 2 ViewsResult Date: 6/17/2024CLINICAL INFORMATION: abdominal pain COMPARISON: Chest x-ray 09/16/2021 TECHNIQUE:  AP view of the chest. FINDINGS: Heart/Mediastinum:  Normal heart size.  Unremarkable mediastinum. Lungs:  No acute consolidation.  No pleural effusions. Osseous Structures:  No acute abnormalities.Negative study. [place] Abdomen+pelvis w/contrast w/POResult Date: 6/13/2024CT scan of the abdomen and pelvis. May 26, 2023 at 2148 hours Clinical History: History of gastric bypass 05/16/23, presents with nausea, vomiting, constipation, leukocytosis. Technique: Helical axial sections with sagittal and coronal reformats of the abdomen and pelvis were obtained with intravenous contrast. Iterative reconstruction technique was employed to reduce patient radiation exposure. Contrast Dose: Omnipaque IV. Radiation Dose: Total exam DLP 1569 mGy/cm. Findings: The lung bases are clear. There is a small hiatal hernia. The liver, gallbladder, pancreas, spleen and adrenals appear unremarkable. Bilateral kidneys appear unremarkable. No renal/ureteric calculus or  hydronephrosis. Status post Roux-en-y gastric bypass. No evidence of bowel obstruction. No evidence of oral contrast leakage. The appendix is within normal limits. A moderate amount of fecal material is present in the ascending and transverse colon. Postsurgical changes  are seen in the anterior abdominal wall with air loculi within. The urinary bladder appears unremarkable. An intrauterine contraceptive device is noted in the lower uterine segment, likely misplaced. Bilateral ovaries with follicles are noted. No evidence of free fluid, free air or abscess. The abdominal aorta and its branches appear unremarkable. Mild degenerative changes are identified in the spine.1. Status post Roux-en-y gastric bypass. No evidence of oral contrast leakage. No definitive evidence of pneumoperitoneum. 2. Intrauterine contraceptive device in the lower uterine segment, likely undisplaced. Recommend clinical correlation. 3. Other findings as described above.[place] Non-OB Transvaginal & Limited TranspelvicResult Date: 4/26/2024CLINICAL INFORMATION: Endometrial hyperplasia TECHNIQUE: Sonographic evaluation of the pelvis was performed transabdominally and transvaginally using grayscale, pulsed wave and color Doppler. COMPARISON:  May 18, 2022 ultrasound, then transvaginal FINDINGS: Limited examination due to body habitus and gas Uterus: Measures 7.8 x 3.5 x 5.1 cm. The uterus demonstrates normal in size and echotexture. The endometrium measures up to 3 mm. There is an IUD seen, once again in the lower uterine segment. Right Ovary:  Measures  3.0 x 1.8 x 1.4 cm. The ovary is unremarkable. There is normal vascularity. Left Ovary:  Measures 4.6 x 2.9 x 3.0 cm. The ovary is unremarkable. There is normal vascularity. Peritoneum: There is small volume free fluid seen in the cul-de-sac.  IUD visualized within the lower uterine segment. Otherwise, unremarkable. Reported and signed by: Marica Otter, MD PathologyReviewed in Epic Pathology tabNone. 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 A diagnosis of Oral phase dysphagia was also pertinent to this visit.IMPRESSION/Aguilar:27 y.o. 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 -Discussed natural history of this diagnosis. When comparing today's exam to photo taken on 06/08/23, these ulcerative lesions do seem to be improving. Discussed with patient to avoid local trauma to this area. -Can hold off on antibiotics that were prescribed by the ED. No evidence of infection. -Discussed the importance of restarting normal oral hygiene with twice a day dental -Follow up 2-3 weeks The patient knows to contact me at any point with questions or concerns.Scribed for Ailene Ards, MD by Karenann Cai, medical scribe June 21, 2023  The documentation recorded by the scribe accurately reflects the services I personally performed and the decisions made by me. I reviewed and confirmed all material entered and/or pre-charted by the scribe.

## 2023-07-05 ENCOUNTER — Encounter: Admit: 2023-07-05 | Payer: MEDICAID | Attending: Surgery | Primary: Adult Health

## 2023-07-12 ENCOUNTER — Encounter: Admit: 2023-07-12 | Payer: MEDICAID | Attending: Surgery | Primary: Adult Health

## 2023-07-21 ENCOUNTER — Ambulatory Visit: Admit: 2023-07-21 | Payer: MEDICAID | Primary: Adult Health

## 2023-07-21 ENCOUNTER — Encounter: Admit: 2023-07-21 | Payer: PRIVATE HEALTH INSURANCE | Primary: Adult Health

## 2023-07-21 ENCOUNTER — Inpatient Hospital Stay
Admit: 2023-07-21 | Discharge: 2023-07-22 | Payer: MEDICAID | Attending: Student in an Organized Health Care Education/Training Program | Primary: Adult Health

## 2023-07-21 DIAGNOSIS — G4733 Obstructive sleep apnea (adult) (pediatric): Secondary | ICD-10-CM

## 2023-07-21 DIAGNOSIS — C801 Malignant (primary) neoplasm, unspecified: Secondary | ICD-10-CM

## 2023-07-21 DIAGNOSIS — R109 Unspecified abdominal pain: Secondary | ICD-10-CM

## 2023-07-21 DIAGNOSIS — F32A Depression: Secondary | ICD-10-CM

## 2023-07-21 DIAGNOSIS — E282 Polycystic ovarian syndrome: Secondary | ICD-10-CM

## 2023-07-21 DIAGNOSIS — Z6841 Body Mass Index (BMI) 40.0 and over, adult: Secondary | ICD-10-CM

## 2023-07-21 DIAGNOSIS — R519 Headache: Secondary | ICD-10-CM

## 2023-07-21 LAB — CBC WITH AUTO DIFFERENTIAL
BKR WAM ABSOLUTE IMMATURE GRANULOCYTES.: 0.04 x 1000/ÂµL (ref 0.00–0.30)
BKR WAM ABSOLUTE LYMPHOCYTE COUNT.: 1.64 x 1000/ÂµL (ref 0.60–3.70)
BKR WAM ABSOLUTE NRBC (2 DEC): 0 x 1000/ÂµL (ref 0.00–1.00)
BKR WAM ANC (ABSOLUTE NEUTROPHIL COUNT): 6.82 x 1000/ÂµL (ref 2.00–7.60)
BKR WAM BASOPHIL ABSOLUTE COUNT.: 0.03 x 1000/ÂµL (ref 0.00–1.00)
BKR WAM BASOPHILS: 0.3 % (ref 0.0–1.4)
BKR WAM EOSINOPHIL ABSOLUTE COUNT.: 0.02 x 1000/ÂµL (ref 0.00–1.00)
BKR WAM EOSINOPHILS: 0.2 % (ref 0.0–5.0)
BKR WAM HEMATOCRIT (2 DEC): 33.4 % — ABNORMAL LOW (ref 35.00–45.00)
BKR WAM HEMOGLOBIN: 11 g/dL — ABNORMAL LOW (ref 11.7–15.5)
BKR WAM IMMATURE GRANULOCYTES: 0.4 % (ref 0.0–1.0)
BKR WAM LYMPHOCYTES: 16.9 % — ABNORMAL LOW (ref 17.0–50.0)
BKR WAM MCH (PG): 31.3 pg (ref 27.0–33.0)
BKR WAM MCHC: 32.9 g/dL (ref 31.0–36.0)
BKR WAM MCV: 95.2 fL (ref 80.0–100.0)
BKR WAM MONOCYTE ABSOLUTE COUNT.: 1.14 x 1000/ÂµL — ABNORMAL HIGH (ref 0.00–1.00)
BKR WAM MONOCYTES: 11.8 % (ref 4.0–12.0)
BKR WAM MPV: 10 fL (ref 8.0–12.0)
BKR WAM NEUTROPHILS: 70.4 % (ref 39.0–72.0)
BKR WAM NUCLEATED RED BLOOD CELLS: 0 % (ref 0.0–1.0)
BKR WAM PLATELETS: 330 x1000/ÂµL (ref 150–420)
BKR WAM RDW-CV: 16.3 % — ABNORMAL HIGH (ref 11.0–15.0)
BKR WAM RED BLOOD CELL COUNT.: 3.51 M/ÂµL — ABNORMAL LOW (ref 4.00–6.00)
BKR WAM WHITE BLOOD CELL COUNT: 9.7 x1000/ÂµL (ref 4.0–11.0)

## 2023-07-21 LAB — COMPREHENSIVE METABOLIC PANEL
BKR A/G RATIO: 1 (ref 1.0–2.2)
BKR ALANINE AMINOTRANSFERASE (ALT): 24 U/L (ref 10–35)
BKR ALBUMIN: 3.3 g/dL — ABNORMAL LOW (ref 3.6–5.1)
BKR ALKALINE PHOSPHATASE: 94 U/L (ref 9–122)
BKR ANION GAP: 14 (ref 7–17)
BKR ASPARTATE AMINOTRANSFERASE (AST): 32 U/L (ref 10–35)
BKR AST/ALT RATIO: 1.3
BKR BILIRUBIN TOTAL: 0.5 mg/dL (ref <=1.2)
BKR BLOOD UREA NITROGEN: 7 mg/dL (ref 6–20)
BKR BUN / CREAT RATIO: 15.6 (ref 8.0–23.0)
BKR CALCIUM: 9.4 mg/dL (ref 8.8–10.2)
BKR CHLORIDE: 105 mmol/L (ref 98–107)
BKR CO2: 25 mmol/L (ref 20–30)
BKR CREATININE: 0.45 mg/dL (ref 0.40–1.30)
BKR EGFR, CREATININE (CKD-EPI 2021): >60 mL/min/{1.73_m2} (ref >=60)
BKR GLOBULIN: 3.4 g/dL (ref 2.0–3.9)
BKR GLUCOSE: 86 mg/dL (ref 70–100)
BKR POTASSIUM: 4 mmol/L (ref 3.3–5.3)
BKR PROTEIN TOTAL: 6.7 g/dL (ref 5.9–8.3)
BKR SODIUM: 144 mmol/L (ref 136–144)

## 2023-07-21 LAB — URINALYSIS WITH CULTURE REFLEX      (BH LMW YH)
BKR BILIRUBIN, UA: NEGATIVE
BKR GLUCOSE, UA: NEGATIVE
BKR LEUKOCYTE ESTERASE, UA: NEGATIVE
BKR NITRITE, UA: NEGATIVE
BKR PH, UA: 6.5 (ref 5.5–7.5)
BKR SPECIFIC GRAVITY, UA: 1.026 (ref 1.005–1.030)
BKR UROBILINOGEN, UA (MG/DL): 6 mg/dL — ABNORMAL HIGH (ref <=2.0)

## 2023-07-21 LAB — URINE MICROSCOPIC     (BH GH LMW YH)
BKR RBC/HPF INSTRUMENT: 12 /HPF — ABNORMAL HIGH (ref 0–2)
BKR URINE SQUAMOUS EPITHELIAL CELLS, UA (NUMERIC): 27 /HPF — ABNORMAL HIGH (ref 0–5)
BKR WBC/HPF INSTRUMENT: 3 /HPF (ref 0–5)

## 2023-07-21 LAB — MAGNESIUM: BKR MAGNESIUM: 1.9 mg/dL (ref 1.7–2.4)

## 2023-07-21 LAB — LIPASE: BKR LIPASE: 81 U/L — ABNORMAL HIGH (ref 11–55)

## 2023-07-21 MED ORDER — ONDANSETRON 4 MG DISINTEGRATING TABLET
4 | Freq: Once | ORAL | Status: CP
Start: 2023-07-21 — End: ?
  Administered 2023-07-21: 17:00:00 4 mg via ORAL

## 2023-07-21 MED ORDER — ONDANSETRON HCL (PF) 4 MG/2 ML INJECTION SOLUTION
4 | Freq: Once | INTRAVENOUS | Status: DC
Start: 2023-07-21 — End: 2023-07-22

## 2023-07-21 MED ORDER — ONDANSETRON HCL (PF) 4 MG/2 ML INJECTION SOLUTION
4 | Freq: Once | INTRAVENOUS | Status: DC
Start: 2023-07-21 — End: 2023-07-21

## 2023-07-21 MED ORDER — LACTATED RINGERS IV BOLUS (NEW BAG)
Freq: Once | INTRAVENOUS | Status: CP
Start: 2023-07-21 — End: ?
  Administered 2023-07-21: 21:00:00 1000.000 mL/h via INTRAVENOUS

## 2023-07-21 MED ORDER — IOHEXOL 350 MG IODINE/ML INTRAVENOUS SOLUTION
350 mg iodine/mL | Freq: Once | INTRAVENOUS | Status: CP | PRN
Start: 2023-07-21 — End: ?
  Administered 2023-07-22: 350 mL via INTRAVENOUS

## 2023-07-21 NOTE — ED Notes
1:48 PM 27 yo female presents to ED c/o vomiting. Pt alert and oriented x4, speaking in full complete sentences, respirations even and unlabored, skin appropriate for ethnicity, NAD noted at this time. Had emergency surgery on 7/18 d/t perforated peptic ulcer. Has G tube in place. Presents today for nausea/vomiting, worsening since Sunday, unable to tolerate PO intake. C/o R shoulder pain, denies fall/injury. Still has gallbladder. Denies abd pain. Hx of marijuana use, states she hasn't smoked since prior to gastric bypass. Pending provider eval.1:55 PMTexted ED resident for [place] guided IV.3:02 PMBlood drawn by Dr. Kathyrn Lass. Attempting PIV at this time.

## 2023-07-21 NOTE — ED Notes
12:42 PM 2 RN's looked for IV access and foun nothing , changed iv zofran to PO, told DR Penny Pia md , this will be dealt with when she gets to a room

## 2023-07-21 NOTE — ED Notes
5:32 UJ:[id number] to begin administering 1L of LR infusion, patient states she feels IV is burning and it doesn't feel right. LR stopped and disconnected, site warm to the touch, MD Sherilyn Cooter made aware. New attempt of US guided IV to be attempted by physician.6:03 NF:[id number] for US guided to be performed, unable to access [place] via MD Sherilyn Cooter. Per MD, IV team consult order to be placed to assist with PIV access.7:31 PM:IV team successfully placed PIV, patient appears comfortable and tolerated well. Medications to be resumed. Patient report given and care deferred to [facility] RN

## 2023-07-21 NOTE — ED Notes
5:02 PM US guided IV placed by provider, unable to draw blood from line at this time, Sherilyn Cooter MD aware and plan for pt to receive L of fluids and then re-attempt.  Pt declining Zofran at this time, I am not nauseous.  Vitals updated, pt ambulatory to INF 1 for fluids.  Report given to [employer]

## 2023-07-22 DIAGNOSIS — G8929 Other chronic pain: Secondary | ICD-10-CM

## 2023-07-22 DIAGNOSIS — R935 Abnormal findings on diagnostic imaging of other abdominal regions, including retroperitoneum: Secondary | ICD-10-CM

## 2023-07-22 DIAGNOSIS — E870 Hyperosmolality and hypernatremia: Secondary | ICD-10-CM

## 2023-07-22 DIAGNOSIS — R112 Nausea with vomiting, unspecified: Secondary | ICD-10-CM

## 2023-07-22 DIAGNOSIS — M25511 Pain in right shoulder: Secondary | ICD-10-CM

## 2023-07-22 DIAGNOSIS — Z931 Gastrostomy status: Secondary | ICD-10-CM

## 2023-07-22 DIAGNOSIS — R1084 Generalized abdominal pain: Secondary | ICD-10-CM

## 2023-07-22 DIAGNOSIS — Z79899 Other long term (current) drug therapy: Secondary | ICD-10-CM

## 2023-07-22 LAB — CBC WITH AUTO DIFFERENTIAL
BKR WAM ABSOLUTE IMMATURE GRANULOCYTES.: 0.04 x 1000/ÂµL (ref 0.00–0.30)
BKR WAM ABSOLUTE LYMPHOCYTE COUNT.: 2.56 x 1000/ÂµL (ref 0.60–3.70)
BKR WAM ABSOLUTE NRBC (2 DEC): 0 x 1000/ÂµL (ref 0.00–1.00)
BKR WAM ANC (ABSOLUTE NEUTROPHIL COUNT): 7.42 x 1000/ÂµL (ref 2.00–7.60)
BKR WAM BASOPHIL ABSOLUTE COUNT.: 0.03 x 1000/ÂµL (ref 0.00–1.00)
BKR WAM BASOPHILS: 0.3 % (ref 0.0–1.4)
BKR WAM EOSINOPHIL ABSOLUTE COUNT.: 0.05 x 1000/ÂµL (ref 0.00–1.00)
BKR WAM EOSINOPHILS: 0.4 % (ref 0.0–5.0)
BKR WAM HEMATOCRIT (2 DEC): 33.7 % — ABNORMAL LOW (ref 35.00–45.00)
BKR WAM HEMOGLOBIN: 11 g/dL — ABNORMAL LOW (ref 11.7–15.5)
BKR WAM IMMATURE GRANULOCYTES: 0.3 % (ref 0.0–1.0)
BKR WAM LYMPHOCYTES: 22.2 % (ref 17.0–50.0)
BKR WAM MCH (PG): 31.6 pg (ref 27.0–33.0)
BKR WAM MCHC: 32.6 g/dL (ref 31.0–36.0)
BKR WAM MCV: 96.8 fL (ref 80.0–100.0)
BKR WAM MONOCYTE ABSOLUTE COUNT.: 1.41 x 1000/ÂµL — ABNORMAL HIGH (ref 0.00–1.00)
BKR WAM MONOCYTES: 12.3 % — ABNORMAL HIGH (ref 4.0–12.0)
BKR WAM MPV: 10 fL (ref 8.0–12.0)
BKR WAM NEUTROPHILS: 64.5 % (ref 39.0–72.0)
BKR WAM NUCLEATED RED BLOOD CELLS: 0 % (ref 0.0–1.0)
BKR WAM PLATELETS: 309 x1000/ÂµL (ref 150–420)
BKR WAM RDW-CV: 16.3 % — ABNORMAL HIGH (ref 11.0–15.0)
BKR WAM RED BLOOD CELL COUNT.: 3.48 M/ÂµL — ABNORMAL LOW (ref 4.00–6.00)
BKR WAM WHITE BLOOD CELL COUNT: 11.5 x1000/ÂµL — ABNORMAL HIGH (ref 4.0–11.0)

## 2023-07-22 LAB — URINE DRUG SCREEN W/ NO CONF   (BH GH LMH YH)
BKR AMPHETAMINE SCREEN, URINE, NO CONF.: NEGATIVE
BKR BARBITURATE SCREEN, URINE, NO CONF.: NEGATIVE
BKR BENZODIAZEPINES SCREEN, URINE, NO CONF.: NEGATIVE
BKR CANNABINOIDS SCREEN, URINE, NO CONF.: POSITIVE — AB
BKR COCAINE SCREEN, URINE, NO CONF.: NEGATIVE
BKR METHADONE METABOLITE SCREEN, URINE, NO CONF.: NEGATIVE
BKR OPIATES SCREEN, URINE, NO CONF.: NEGATIVE
BKR OXYCODONE SCREEN, URINE, NO CONF.: NEGATIVE
BKR PHENCYCLIDINE (PCP) SCREEN, URINE, NO CONF.: NEGATIVE

## 2023-07-22 LAB — BASIC METABOLIC PANEL
BKR ANION GAP: 15 (ref 7–17)
BKR BLOOD UREA NITROGEN: 7 mg/dL (ref 6–20)
BKR BUN / CREAT RATIO: 13.5 (ref 8.0–23.0)
BKR CALCIUM: 9 mg/dL (ref 8.8–10.2)
BKR CHLORIDE: 106 mmol/L (ref 98–107)
BKR CO2: 25 mmol/L (ref 20–30)
BKR CREATININE: 0.52 mg/dL (ref 0.40–1.30)
BKR EGFR, CREATININE (CKD-EPI 2021): >60 mL/min/{1.73_m2} (ref >=60)
BKR GLUCOSE: 79 mg/dL (ref 70–100)
BKR POTASSIUM: 3.7 mmol/L (ref 3.3–5.3)
BKR SODIUM: 146 mmol/L — ABNORMAL HIGH (ref 136–144)

## 2023-07-22 LAB — FENTANYL, URINE, WITH NO CONFIRMATION (BH GH L LMW YH): BKR FENTANYL: NEGATIVE

## 2023-07-22 LAB — UA REFLEX CULTURE

## 2023-07-22 LAB — MAGNESIUM: BKR MAGNESIUM: 1.9 mg/dL (ref 1.7–2.4)

## 2023-07-22 LAB — PHOSPHORUS     (BH GH L LMW YH): BKR PHOSPHORUS: 4.1 mg/dL (ref 2.2–4.5)

## 2023-07-22 MED ORDER — OMEPRAZOLE 40 MG CAPSULE,DELAYED RELEASE
40 mg | Freq: Every day | ORAL | Status: DC
Start: 2023-07-22 — End: 2023-07-22

## 2023-07-22 MED ORDER — KETOROLAC 30 MG/ML (1 ML) INJECTION SOLUTION
30 | Freq: Once | INTRAMUSCULAR | Status: CP
Start: 2023-07-22 — End: ?
  Administered 2023-07-22: 05:00:00 30 mL via INTRAMUSCULAR

## 2023-07-22 MED ORDER — ALUMINUM-MAG HYDROXIDE-SIMETHICONE 200 MG-200 MG-20 MG/5 ML ORAL SUSP
200-200-20 | Freq: Four times a day (QID) | ORAL | Status: AC | PRN
Start: 2023-07-22 — End: ?

## 2023-07-22 MED ORDER — SODIUM CHLORIDE 0.9 % (FLUSH) INJECTION SYRINGE
0.9 | Freq: Three times a day (TID) | INTRAVENOUS | Status: DC
Start: 2023-07-22 — End: 2023-07-23
  Administered 2023-07-22 (×2): 0.9 mL via INTRAVENOUS

## 2023-07-22 MED ORDER — HYOSCYAMINE 0.125 MG SUBLINGUAL TABLET
0.125 | SUBLINGUAL | Status: DC | PRN
Start: 2023-07-22 — End: 2023-07-23

## 2023-07-22 MED ORDER — NITROFURANTOIN MONOHYDRATE/MACROCRYSTALS 100 MG CAPSULE
100 | Freq: Two times a day (BID) | ORAL | Status: DC
Start: 2023-07-22 — End: 2023-07-22

## 2023-07-22 MED ORDER — SUCRALFATE 100 MG/ML ORAL SUSPENSION
100 | Freq: Before meals | ORAL | Status: DC
Start: 2023-07-22 — End: 2023-07-23

## 2023-07-22 MED ORDER — SUCRALFATE 1 GRAM TABLET
1 | Freq: Four times a day (QID) | ORAL | Status: AC
Start: 2023-07-22 — End: ?

## 2023-07-22 MED ORDER — SODIUM CHLORIDE 0.9 % INTRAVENOUS SOLUTION
INTRAVENOUS | Status: DC
Start: 2023-07-22 — End: 2023-07-23
  Administered 2023-07-22 (×2): via INTRAVENOUS

## 2023-07-22 MED ORDER — ONDANSETRON HCL (PF) 4 MG/2 ML INJECTION SOLUTION
4 | Freq: Three times a day (TID) | INTRAMUSCULAR | Status: DC | PRN
Start: 2023-07-22 — End: 2023-07-22
  Administered 2023-07-22: 06:00:00 4 mL via INTRAMUSCULAR

## 2023-07-22 MED ORDER — HYOSCYAMINE 0.125 MG SUBLINGUAL TABLET
0.125 | ORAL_TABLET | SUBLINGUAL | Status: AC | PRN
Start: 2023-07-22 — End: ?

## 2023-07-22 MED ORDER — ACETAMINOPHEN 325 MG/10.15 ML ORAL SOLUTION
325 | Freq: Three times a day (TID) | ORAL | Status: AC | PRN
Start: 2023-07-22 — End: ?

## 2023-07-22 MED ORDER — NITROFURANTOIN MONOHYDRATE/MACROCRYSTALS 100 MG CAPSULE
100 | Freq: Two times a day (BID) | ORAL | Status: DC
Start: 2023-07-22 — End: 2023-07-22
  Administered 2023-07-22: 06:00:00 100 mg via ORAL

## 2023-07-22 MED ORDER — ONDANSETRON 4 MG DISINTEGRATING TABLET
4 | Status: DC
Start: 2023-07-22 — End: 2023-07-23

## 2023-07-22 MED ORDER — FAMOTIDINE 4 MG/ML IN 0.9% SODIUM CHLORIDE (ADULT)
Freq: Two times a day (BID) | INTRAVENOUS | Status: DC
Start: 2023-07-22 — End: 2023-07-22

## 2023-07-22 MED ORDER — CEFTRIAXONE IV PUSH 1000 MG VIAL & NS (ADULTS)
INTRAVENOUS | Status: DC
Start: 2023-07-22 — End: 2023-07-23
  Administered 2023-07-22: 07:00:00 10.000 mL via INTRAVENOUS

## 2023-07-22 MED ORDER — ENOXAPARIN 40 MG/0.4 ML SUBCUTANEOUS SYRINGE
40 | SUBCUTANEOUS | 1 refills | Status: AC
Start: 2023-07-22 — End: ?

## 2023-07-22 MED ORDER — SODIUM CHLORIDE 0.9 % (FLUSH) INJECTION SYRINGE
0.9 | INTRAVENOUS | Status: DC | PRN
Start: 2023-07-22 — End: 2023-07-23

## 2023-07-22 MED ORDER — ALUMINUM-MAG HYDROXIDE-SIMETHICONE 200 MG-200 MG-20 MG/5 ML ORAL SUSP
200-200-20 | Freq: Four times a day (QID) | ORAL | Status: DC | PRN
Start: 2023-07-22 — End: 2023-07-23

## 2023-07-22 MED ORDER — ONDANSETRON HCL (PF) 4 MG/2 ML INJECTION SOLUTION
4 | Freq: Three times a day (TID) | INTRAVENOUS | Status: DC | PRN
Start: 2023-07-22 — End: 2023-07-23
  Administered 2023-07-22: 20:00:00 4 mL via INTRAVENOUS

## 2023-07-22 MED ORDER — ACETAMINOPHEN 325 MG/10.15 ML ORAL SOLUTION
325 | Freq: Three times a day (TID) | ORAL | Status: DC | PRN
Start: 2023-07-22 — End: 2023-07-23

## 2023-07-22 MED ORDER — ONDANSETRON HCL (PF) 4 MG/2 ML INJECTION SOLUTION
4 | Freq: Three times a day (TID) | INTRAVENOUS | Status: AC | PRN
Start: 2023-07-22 — End: ?

## 2023-07-22 MED ORDER — ONDANSETRON 4 MG DISINTEGRATING TABLET
4 | ORAL_TABLET | Freq: Three times a day (TID) | 1 refills | Status: AC | PRN
Start: 2023-07-22 — End: ?

## 2023-07-22 MED ORDER — BISACODYL 10 MG RECTAL SUPPOSITORY
10 | Freq: Every day | RECTAL | Status: AC | PRN
Start: 2023-07-22 — End: ?

## 2023-07-22 MED ORDER — FAMOTIDINE 4 MG/ML IN 0.9% SODIUM CHLORIDE (ADULT)
Freq: Two times a day (BID) | INTRAVENOUS | Status: DC
Start: 2023-07-22 — End: 2023-07-23
  Administered 2023-07-22: 07:00:00 5.000 mL via INTRAVENOUS

## 2023-07-22 MED ORDER — BISACODYL 10 MG RECTAL SUPPOSITORY
10 | Freq: Every day | RECTAL | Status: DC | PRN
Start: 2023-07-22 — End: 2023-07-23

## 2023-07-22 MED ORDER — CEFTRIAXONE IV PUSH 1000 MG VIAL & NS (ADULTS)
INTRAVENOUS | Status: AC
Start: 2023-07-22 — End: ?

## 2023-07-22 MED ORDER — AMLODIPINE 2.5 MG TABLET
2.5 | Freq: Every day | ORAL | Status: AC
Start: 2023-07-22 — End: ?

## 2023-07-22 MED ORDER — URSODIOL 250 MG TABLET
250 | ORAL | Status: AC
Start: 2023-07-22 — End: ?

## 2023-07-22 MED ORDER — ONDANSETRON 4 MG DISINTEGRATING TABLET
4 | Freq: Once | ORAL | Status: CP
Start: 2023-07-22 — End: ?
  Administered 2023-07-22: 05:00:00 4 mg via ORAL

## 2023-07-22 MED ORDER — FAMOTIDINE 4 MG/ML IN 0.9% SODIUM CHLORIDE (ADULT)
Freq: Two times a day (BID) | INTRAVENOUS | Status: AC
Start: 2023-07-22 — End: ?

## 2023-07-22 MED ORDER — ENOXAPARIN 40 MG/0.4 ML SUBCUTANEOUS SYRINGE
40 | SUBCUTANEOUS | Status: DC
Start: 2023-07-22 — End: 2023-07-23
  Administered 2023-07-22: 12:00:00 40 mg/0.4 mL via SUBCUTANEOUS

## 2023-07-22 NOTE — ED Provider Notes
Chief Complaint Patient presents with  Emesis   Pt states that she  had a gastric bypass on 05/16/23 and had an endoscopy an endoscopy 2 weeks following. Pt states that she was found to have a perforated peptic ulcer on 7/18 and has a G-tube in place.told to eat mushy foods but has been having nausea and is vomiting +Rt shoulder pain x 1 month. 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 Patient reports she developed generalized abdomina pain and vomiting that started on Sunday. The patient reports that the vomiting occurs after eating and has continued since the onset. The patient is able to consume mushy, soft food orally and uses a G tube for medication administration. The patient has not had a bowel movement since Tuesday and describes the last bowel movement as being very loose, similar to diarrhea. The patient also reports not passing gas since yesterday..The patient also reports right shoulder pain for one month. Physical ExamED Triage Vitals Enc Vitals Group    BP 07/21/23 1146 (!) 154/94    Pulse 07/21/23 1146 78    Resp 07/21/23 1146 18    Temp 07/21/23 1146 97.1 ?F (36.2 ?C)    Temp src 07/21/23 1146 Temporal    SpO2 07/21/23 1146 100 %    Weight 07/21/23 1155 134.4 kg BP 139/84  - Pulse 75  - Temp 97.1 ?F (36.2 ?C) (Temporal)  - Resp 18  - Wt 134.4 kg (296 lb 4.8 oz)  - LMP  (Within Days)  - SpO2 100%  - BMI 47.82 kg/m? On physical exam patient is afebrile, hemodynamically stable with equal pulses bilaterally, saturating well on room air, abdomen flat and soft with generalized tenderness, g tube in place w/out surrounding erythema Medical Decision MakingThis history and presentation raises concern for SBO. Differential diagnosis includes PUD, GUD, pancreatitis, metabolic derangement, dehydration. Decreased suspicion for viscous perforation, vascular catastrophe, mesenteric ischemia, GYN emergency, given current reassuring history and exam.Plan for [place] A/P, labs, IV fluids, IV zofran. General surgery consult as indicated.Patient ultrasound guided IV line infiltrated. Upright abdominal xray ordered to evaluate for free air under the diaphragm, while new IV placement is pending.ED Course:Comments as of 07/22/23 0044 Thu Jul 21, 2023 2003 Comprehensive metabolic panel(!)No AKI or concerning electrolyte derangement. [JH] 2006 CBC and differential(!)Mild anemia. No leukocytosis. [JH] 2006 Urinalysis with culture reflex(!)Contaminated sample w/out evidence of infection [JH] 2006 Preg Test, Urine: Negative [JH]  Comments User Index[JH] Joanne Chars, MD   Clinical Impressions as of 07/22/23 0044 Abdominal pain, unspecified abdominal location Chronic right shoulder pain Intractable vomiting ProceduresAttestation/Critical CareED DispositionObservationReceived  in signout @2230h  HenryReported stable YAwaiting results of ED test or Treatment YAnticipate discharge YSpecial concerns N--JSavageCT I reviewed both Radiologic reports and the available images. No significant changes from the radiological report below were noted: Fat stranding in the right lower quadrant, likely postsurgical and may reflect areas of fat necrosis.Small amount of free pelvic fluid, nonspecific and may be physiologic.No perforation. 0030h - pt still has nausea, redosed zofran, pt wishes to stay in hospitalPt has chronic rt shoulder pain. Xray showed no acute findings.IV infiltrated and removed. Pt requires IR for line.Signout to Dr. Imogene Burn, plan to admit Antony Madura, MD08/09/24 0027 Antony Madura, MD08/09/24 0040 Antony Madura, MD08/09/24 [phone]

## 2023-07-22 NOTE — ED Notes
7:59 PM Assumed care of patient, verbal report endorsed from [place], [place]. New PIV in place by IV team, pt at [place] scan now. 12:38 AM IV infiltrated, per MD Lin Givens will wait till morning for IV team.12:46 AM Pt actively vomiting md aware, zofran administered per MAR ODT, toradol administered IM per MAR.ED to Floor HandoffAdmission Dx:   Abdominal Pain           Telemetry: 	[]  Yes		[x]  NoOxygen Tank on Stretcher >1000 PSI:  [x]  YesCode Status:   [x]  Full		[]  Partial		[]  No CodeIsolation: 	[x]  None	[]  Contact	[]  Droplet	[]  AirborneSafety Precautions: []  None	[]  Sitter   []  Restraints	[]  Suicidal	[x]  Fall Risk	Other (specify):           Mentation/Orientation:    A&O (person, place, time and situation) x    4      	 		Disoriented to:          Deficits: []  Hearing impaired	[]  Blind  	[]  Nonverbal		 []  ID/DD (intelectual disability/developmental disability)Ambulation: [x]  Ambulatory	[]  Non-Ambulatory  Diet: []  OK to eat	[]  NPO	Other (specify): Clear Liquid diet          IV Access: []  Yes   []  No     Vital signs Charted in the last hour: [x] Yes    []  No         Other (specify)            Skin Alteration:[]  Pressure Injury	[]  Wound	[]  None	[x]  Skin Not AssessedDiarrhea/Loose stool : []  1x within 24h  []  2x within 24h  []  3x within 24h  [x]  None      C.Diff Order:  []  Ordered- needs to be collected    []  Collected-sent to lab    []  Resulted - Negative C.Diff  []  Resulted - Positive C.Diff    []  Not Ordered   	[x]  N/APatient Belongings:Does the patient have belongings going to the floor?   [x] Yes    []  NoAre the belongings documented?          [x] Yes    []  NoIs someone taking belongings home?   [] Yes    [x]  No   Who (specify):           _________Chloe Natale Milch, RNPlease call the ED RN if you have any questions            You can always call the [facility] ED charge nurse at [phone] if you have any concerns [facility] ED [facility] Emergency Department  2:09 AMPt vomited after giving macrobid, MD aware.2:31 AMNew PIV access obtained, pt moved to MM27 for continuity of care  and to await admission bed. Report endorsed to [place], Charity fundraiser.

## 2023-07-22 NOTE — ED Notes
2:30 AM Assumed care of pt. Report from [place], Charity fundraiser. Pt pending admission bed. Pt changed and hooked up to cardiac monitor. Pt ambulated to bathroom unassisted to obtain urine sample. 2:47 AM Urine collected and sent to lab. Medication administered per MAR. Vitals reassessed. Pt given additional blanket per request. Deneen Harts, RN

## 2023-07-22 NOTE — ED Notes
8:03 AM Pt Is alert and oriented x4, VSS, has no abdominal pain , no n/v/diarrhea at this time.  Pt is ambulating to the bathroom with steady gait, no trouble voiding. Call bell within reach, wctm.10:00 AM Pt reports that she does not have appetite at the moment she had few spoons of jello.  Wctm.7:39 PM Pt is being transferred to [facility], left the floor by stretcher. Report was given to the nurse that will take care of the pt, discharge (imaging and lab results) papers sent with the pt. Report given to the transport staff members.

## 2023-07-22 NOTE — Progress Notes
[facility] Medicine Progress NoteAttending Provider: Joanne Chars, MD;Savag* Subjective                                                                              Subjective: Interim History:  Complains of Pain around G-tube asking if it can be taken out.  It was placed by [employer].  Also urinary frequency/dysuria.Review of Allergies/Meds/Hx: Review of Allergies/Meds/Hx:I have reviewed the patient's: allergies, current scheduled medications, and past medical history Objective Objective: Vitals:I have reviewed the patient's current vital signs as documented in the patient's EMR.   and Last 24 hours: Temp:  [97.9 ?F (36.6 ?C)-98.7 ?F (37.1 ?C)] 97.9 ?F (36.6 ?C)Pulse:  [68-85] 78Resp:  [17-20] 18BP: (127-147)/(68-86) 133/76SpO2:  [97 %-100 %] 100 %I/O's:I have reviewed the patient's current I&O's as documented in the EMR.No intake/output data recorded.Procedures:None Physical ExamConstitutional:     Appearance: Normal appearance. HENT:    Head: Normocephalic and atraumatic. Cardiovascular:    Rate and Rhythm: Normal rate and regular rhythm.    Pulses: Normal pulses.    Heart sounds: Normal heart sounds. Pulmonary:    Effort: Pulmonary effort is normal.    Breath sounds: Normal breath sounds. Abdominal:    General: Abdomen is flat.    Palpations: Abdomen is soft. Musculoskeletal:       General: Normal range of motion. Skin:   General: Skin is warm. Neurological:    General: No focal deficit present.    Mental Status: She is alert. Psychiatric:       Mood and Affect: Mood normal.  Labs:Last 8 hours: Recent Results (from the past 8 hour(s)) Magnesium  Collection Time: 07/22/23  5:32 AM Result Value Ref Range  Magnesium 1.9 1.7 - 2.4 mg/dL Phosphorus  Collection Time: 07/22/23  5:32 AM Result Value Ref Range  Phosphorus 4.1 2.2 - 4.5 mg/dL Basic metabolic panel  Collection Time: 07/22/23  5:32 AM Result Value Ref Range  Sodium 146 (H) 136 - 144 mmol/L  Potassium 3.7 3.3 - 5.3 mmol/L  Chloride 106 98 - 107 mmol/L  CO2 25 20 - 30 mmol/L  Anion Gap 15 7 - 17  Glucose 79 70 - 100 mg/dL  BUN 7 6 - 20 mg/dL  Creatinine 0.98 1.19 - 1.30 mg/dL  Calcium 9.0 8.8 - 14.7 mg/dL  BUN/Creatinine Ratio 82.9 8.0 - 23.0  eGFR (Creatinine) >60 >=60 mL/min/1.65m2 CBC auto differential  Collection Time: 07/22/23  5:32 AM Result Value Ref Range  WBC 11.5 (H) 4.0 - 11.0 x1000/?L  RBC 3.48 (L) 4.00 - 6.00 M/?L  Hemoglobin 11.0 (L) 11.7 - 15.5 g/dL  Hematocrit 56.21 (L) 30.86 - 45.00 %  MCV 96.8 80.0 - 100.0 fL  MCH 31.6 27.0 - 33.0 pg  MCHC 32.6 31.0 - 36.0 g/dL  RDW-CV 57.8 (H) 46.9 - 15.0 %  Platelets 309 150 - 420 x1000/?L  MPV 10.0 8.0 - 12.0 fL  Neutrophils 64.5 39.0 - 72.0 %  Lymphocytes 22.2 17.0 - 50.0 %  Monocytes 12.3 (H) 4.0 - 12.0 %  Eosinophils 0.4 0.0 - 5.0 %  Basophil 0.3 0.0 - 1.4 %  Immature Granulocytes 0.3 0.0 - 1.0 %  nRBC 0.0 0.0 - 1.0 %  Absolute Lymphocyte  Count 2.56 0.60 - 3.70 x 1000/?L  Monocyte Absolute Count 1.41 (H) 0.00 - 1.00 x 1000/?L  Eosinophil Absolute Count 0.05 0.00 - 1.00 x 1000/?L  Basophil Absolute Count 0.03 0.00 - 1.00 x 1000/?L  Absolute Immature Granulocyte Count 0.04 0.00 - 0.30 x 1000/?L  Absolute nRBC 0.00 0.00 - 1.00 x 1000/?L  ANC (Abs Neutrophil Count) 7.42 2.00 - 7.60 x 1000/?L Diagnostics:No new radiology.ECG/Tele Events: No ECG ordered today Assessment Assessment: Assessment:  27 year old female with PMH hypertension, polycystic ovaries, pituitary mass morbid obesity status post gastric bypass in June complicated by a 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  She underwent emergent diagnostic laparoscopy for perforated ulcer at the GJ anastomosis which was repaired.  Gastrostomy tube was placed and used initially for feeding and ultimately for medication.  By 7/23 she was tolerating clear liquids and advanced to pureed.  She presented to our ED with complain of generalized abdominal discomfort.  She also reported some vomiting poor tolerance to p.o..  In the ED urinalysis concerning for possible UTI she was started on ceftriaxone.  [place] scan of the abdomen pelvis showed fat stranding in the right lower quadrant likely postsurgical possible fat necrosis small amount of free pelvic fluid.  No perforation noted.  White count was 9.7 today 11.5 BNP LFTs normal.  She was started on clear liquidsPrincipal Problem:  Abdominal pain, unspecified abdominal location  SNOMED [place](R): ABDOMINAL PAIN    Plan Plan: Resume Carafate and Levsin p.r.n. as previously prescribedDulcolax p.r.n. for reported constipationAdvance diet (appears she was on bariatric pureed at the time of her recent discharge)Follow urine culture/temperature/white blood cell countMay consider GI or general surgery evaluation if she fails to improve Comorbidities Comorbidities present on admission:Class 3 obesity noted, with  .   {Electronically Signed:Glora Hulgan Ferol Luz, NP Beeper 07/22/2023, 1:22 PM

## 2023-07-22 NOTE — Utilization Review (ED)
Pt is obs appropriate for abd pain, hx of gastric bypass.  Fluids infusing. UM Status: UR/MD agree on OBS statusKim Daniele Yankowski RNED Utilization Coordinator

## 2023-07-22 NOTE — ED Notes
6:11 AM NP  admitted to unit via ED. Pt came to unit with c/o abdominal pain and vomiting. On assessment, pt is A+Ox4, no c/o abdominal pain/n/v at the moment. Breathes even and unlabored on r/a. Abdomen round, same soft and non tender to touch, Gtube in place to  LUQ same spigotted. Pt ambulated from stretcher to bed with steady gait. Oriented to use of call light and unit. VSS. Call light within reach, bed in lowest position, WCTM.

## 2023-07-22 NOTE — H&P
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  States over the past 5 days, has had generalized abdominal discomfort.  Has not been able to tolerate food.  Last bowel movement was 3 days back.  Vomits every time she tries to eat something.  Sometimes is able to consume salt food orally.  Uses her G-tube for medication administration.  States that this has happened in the past as well since she had the surgery.  Labs showed lipase 81.  Urinalysis suspicious for underlying infection.  Imaging reviewed.  Patient was given ketorolac, 1 L ringer lactate, Zofran.  Patient's IV line infiltrated.  ED unable to obtain IV access.  Hospitalist team called for admission.  At the time of evaluation, patient was seen sitting in a chair.  Apparently comfortable.  Starts spitting up phlegm during interview.  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)  DENTAL SURGERY    DILATION AND CURETTAGE OF UTERUS  04/2016  Dr Earnest Conroy ([facility])  KNEE ARTHROSCOPY Right 02/2017  acl repair and meniscal repair  OVARIAN CYST REMOVAL Right 2018  Dr Elenor Quinones- benign  Social History Family History Social History Socioeconomic History  Marital status: Single   Spouse name: Not on file  Number of children: Not on file  Years of education: Not on file  Highest education level: Not on file Occupational History  Not on file Tobacco Use  Smoking status: Never  Smokeless tobacco: Never Vaping Use  Vaping Use: Never used Substance and Sexual Activity  Alcohol use: Yes   Comment: socially  Drug use: Yes   Types: Marijuana  Sexual activity: Not Currently   Birth control/protection: None Other Topics Concern  Smoke Detectors in the home Yes  Carbon Monoxide detectors Yes  Bullying Risk No  Guns in the home No Social History Narrative  Lives with foster mother. No smokers at home. Senior in high school. Going to [employer]  Social Determinants of Health Financial Resource Strain: Not on file Food Insecurity: Not on file Transportation Needs: Not on file Physical Activity: Not on file Stress: Not on file Social Connections: Not on file Intimate Partner Violence: Not on file Housing Stability: Not on file  Family History Problem Relation Age of Onset  Hypertension Mother   Stroke Mother 11      HTN related  Dementia Paternal Grandmother   Prior to Admission Medications Patient's Medications New Prescriptions  ONDANSETRON (ZOFRAN-ODT) 4 MG DISINTEGRATING TABLET    Place 1 tablet (4 mg total) onto the tongue every 8 (eight) hours as needed for nausea for up to 7 days.  Previous Medications  AMOXICILLIN (AMOXIL) 875 MG TABLET    Take 1 tablet (875 mg total) by mouth every 12 (twelve) hours.  CLINDAMYCIN (CLEOCIN T) 1 % SWAB    Apply topically 2 (two) times daily.  IBUPROFEN (ADVIL,MOTRIN) 600 MG TABLET    Take 1 tablet (600 mg total) by mouth every 6 (six) hours as needed for pain.  UBROGEPANT (UBRELVY) 100 MG TABLET    Take 1 tablet (100 mg total) by mouth as needed (migraine). Modified Medications  No medications on file Discontinued Medications  No medications on file  Allergies No Known Allergies Review of Systems: Otherwise, constitutional, head, eye, ENT, cardiac, pulmonary, gastroenterological, musculoskeletal, skin, endocrine, hematologic, allergy, and immunologic ROS are unremarkable.Objective: Vitals:I have reviewed the patient's current vital signs as documented in the patient's EMR.  Physical Exam: Constitutional:  Uncomfortable appearing due to persistent nausea, retchingHENT: Normocephalic, atraumatic. Eyes: PERRL, EOM intact, conjunctivae normalNeck: ROM normal, suppleMouth/Throat: Oropharynx is clear and moist. Cardiovascular: Normal rate, regular rhythm and normal heart sounds.  Pulmonary/Chest: Effort normal. No wheezing, no rales. Abdominal: Soft. Bowel sounds are normal, non tender, non distended.  G-tube placeMusculoskeletal:no acute inflammation, no tenderness. Neurological:alert and oriented to person, place, and time. no focal deficits noted. CN grossly intactLabs: Recent Labs Lab 08/08/241923 WBC 9.7 HGB 11.0* HCT 33.40* MCV 95.2 PLT 330 Recent Labs Lab 08/08/241502 NA 144 K 4.0 CL 105 CO2 25 BUN 7 CREATININE 0.45 GLU 86 ANIONGAP 14 CALCIUM 9.4 MG 1.9 Recent Labs Lab 08/08/241502 ALBUMIN 3.3* AST 32 ALT 24 ALKPHOS 94 BILITOT 0.5 No results for input(s): LABPROT, INR, PTT in the last 168 hours.Recent Labs Lab 08/08/241502 GLU 86 Recent Labs Lab 08/08/241742 SPECGRAV 1.026 PHUR 6.5 BACTERIA Moderate* LEUKOCYTESUR Negative BLOODU 3+* KETONESU 2+* No results for input(s): PHART, PCO2ART, PO2ART, HCO3ART, O2SATART, LITERFLOW in the last 168 hours.Diagnostic Review:I have reviewed the patient's Radiology report(s) within the last 48 hrs. Significant findings are per EMR. Shoulder 2V Right Final Result     Negative study.   [facility] Communications Center: Routine.  Reported and signed by: Vivianne Spence, MD   XR Abdomen AP And Decub or Erect View Final Result    No free air under the diaphragm.   [facility] Communications Center: Routine.  Reported and signed by: Vivianne Spence, MD   [place] Abdomen Pelvis with IV Contrast without oral (BMI>25) Final Result Fat stranding in the right lower quadrant, likely postsurgical and may reflect areas of fat necrosis. Small amount of free pelvic fluid, nonspecific and may be physiologic. No perforation.  [facility] Notify System Classification: Routine.   Report initiated by: Nila Nephew, MD   Reported and signed by: Vivianne Spence, MD   ECG/Tele Events: No ECG ordered todayAssessment/Plan: 1. Abdominal pain2. Urinary tract infectionPlan Clear liquid diet.  Advance as tolerated IV fluids Consult IV team to obtain IV access.  Spoke to RN in ED to see if we can get a IV access overnight Tylenol p.r.n.Zofran p.r.n.Check urine toxicology screen Start Macrobid 100 mg b.i.d.Follow urine culturesFamotidine, Maalox If abdominal pain persists, can consult surgeryDVT prophylaxis Full codeAddendum 2:27 a.m. RN informed that we are finally able obtain IV access will discontinue Macrobid.  Start ceftriaxone IVI have reviewed the patient's problem list and updated it as needed.I have personally reviewed medical records on the patient. Prophylaxis: Pharmacologic Prophylaxis: Enoxaparin (Lovenox) 40 mg SQ dailyNotifications: PCP: Enriqueta Shutter [phone] discussed with patient and/or family. YesThe total time spent providing direct patient care including interviewing and conducting a physical examination  on the patient, reviewing patient's chart for this encounter and for previous encounters, reviewing progress notes, consult notes, medications, ordering labs and updating the plan of care to the patient/family was greater than 55 minutesDisclaimerPortions of this note were likely created using dictation software. Efforts were made to ensure accuracy, but transcription errors might be present. For any questions, please don't hesitate to call. Signed:Jamaica Inthavong Jeri Modena, MD Beeper: smart web8/9/20241:50 AM

## 2023-07-22 NOTE — Utilization Review (ED)
UM Status: UR Reviewed-Continue Observation ServicesAbdominal pain, UTI, cultures pending, hx gatric bypass,  receiving intravenous fluids, intravenous antibioticsChristine Perlie Gold, RN BSNUtilization Review

## 2023-07-23 LAB — URINE CULTURE

## 2023-07-24 NOTE — Discharge Summary
[facility] SummaryPatient Data:  Patient Name: Gina Aguilar Admit date: 07/21/2023 Age: 27 y.o. Discharge date: 07/22/2023 DOB: 1996/10/26	 Discharge Attending Physician: Joneen Caraway  MRN: [id number]	 Discharged Condition: fair PCP: Enriqueta Shutter, APRN  Disposition: Acute Hospital \\[facility] {Abdominal pain/ UTIComorbidities present on admission:Class 3 obesity noted, with  .   Secondary diagnoses occurring during hospitalization:Hypernatremia  Post Discharge Follow Up Items: Issues to be Addressed Post Discharge:Medication changes on discharge (Full medication list at conclusion of this summary) :Pending Labs and Tests: Pending Lab Results   Order Current Status  Urine culture In process  Follow-up Information:Dugan, Janyth Pupa, MD[address] [phone] up Future Appointments Date Time Provider Department Center 07/26/2023  9:20 AM Roche, Renaye Rakers, MD [facility] CAD 10/18/2023  9:45 AM BH US2 PA BH [place] [facility] [place] 10/24/2023  9:45 AM Pricilla Riffle, MD [facility] Course: 27 y.o. 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 She underwent emergent diagnostic laparoscopy for perforated ulcer at the GJ anastomosis which was repaired. Gastrostomy tube was placed and used initially for feeding and ultimately for medication. By 7/23 she was tolerating clear liquids and advanced to pureed. She presented to our ED with complain of generalized abdominal discomfort. She also reported some vomiting poor tolerance to p.o.. In the ED urinalysis concerning for possible UTI she was started on ceftriaxone. [place] scan of the abdomen pelvis showed fat stranding in the right lower quadrant likely postsurgical possible fat necrosis small amount of free pelvic fluid. No perforation noted. White count was 9.7 today 11.5 BNP LFTs normal. She was started on clear liquids,levsin, carafate as previously prescribed but  failed to tolerate po/improve. Plan was for GI evaluation ; however the pt  contacted her primary surgeon [facility] or [facility]: Data: Pertinent lab findings:Recent Labs Lab 08/08/241923 08/09/240532 WBC 9.7 11.5* HGB 11.0* 11.0* HCT 33.40* 33.70* PLT 330 309  Recent Labs Lab 08/08/241923 08/09/240532 NEUTROPHILS 70.4 64.5  Recent Labs Lab 08/08/241502 08/09/240532 NA 144 146* K 4.0 3.7 CL 105 106 CO2 25 25 BUN 7 7 CREATININE 0.45 0.52 GLU 86 79 ANIONGAP 14 15  Recent Labs Lab 08/08/241502 08/09/240532 CALCIUM 9.4 9.0 MG 1.9 1.9 PHOS  --  4.1  Recent Labs Lab 08/08/241502 ALT 24 AST 32 ALKPHOS 94 BILITOT 0.5  No results for input(s): PTT, LABPROT, INR in the last 168 hours. Microbiology:Imaging: Imaging results last 24h: Shoulder 2V RightResult Date: 07/21/2023  Negative study. [facility] Communications Center: Routine. Reported and signed by: Vivianne Spence, MD XR Abdomen AP And Decub or Erect ViewResult Date: 07/21/2023 No free air under the diaphragm. [facility] Communications Center: Routine. Reported and signed by: Vivianne Spence, MD  Abdomen Pelvis with IV Contrast without oral (BMI>25)Result Date: 8/8/2024Fat stranding in the right lower quadrant, likely postsurgical and may reflect areas of fat necrosis. Small amount of free pelvic fluid, nonspecific and may be physiologic. No perforation. [place] [facility] Notify System Classification: Routine. Report initiated by: Nila Nephew, MD Reported and signed by: Vivianne Spence, MD  Diet:  Diet DysphagiaMobility:    Physical Exam Discharge vital signs: Vitals:  07/22/23 1238 BP: 133/76 Pulse: 78 Resp: 18 Temp: 97.9 ?F (36.6 ?C) Cognitive Status at Discharge: Alert and Oriented x 3Physical ExamConstitutional:  Appearance: Normal appearance. HENT:    Head: Normocephalic and atraumatic. Cardiovascular:    Rate and Rhythm: Normal rate and regular rhythm.    Pulses: Normal pulses.    Heart sounds: Normal heart sounds. Pulmonary:    Effort: Pulmonary effort is normal.    Breath sounds: Normal breath sounds. Abdominal:    General: Abdomen is flat.    Palpations: Abdomen is soft. Musculoskeletal:       General: Normal range of motion. Skin:   General: Skin is warm. Neurological:    General: No focal deficit present.    Mental Status: She is alert. Psychiatric:       Mood and Affect: Mood normal.  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)  DENTAL SURGERY    DILATION AND CURETTAGE OF UTERUS  04/2016  Dr Earnest Conroy ([facility])  KNEE ARTHROSCOPY Right 02/2017  acl repair and meniscal repair  OVARIAN CYST REMOVAL Right 2018  Dr Elenor Quinones- benign  Social History Family History Social History Tobacco Use  Smoking status: Never  Smokeless tobacco: Never Substance Use Topics  Alcohol use: Yes   Comment: socially  Family History Problem Relation Age of Onset  Hypertension Mother   Stroke Mother 63      HTN related  Dementia Paternal Grandmother     Discharge Medications  Discharge: Current Discharge Medication List  START taking these medications  Details acetaminophen (TYLENOL) 325 mg/10.15 mL solution Take 20.3 mLs (650 mg total) by mouth every 8 (eight) hours as needed.Start date: 07/22/2023  aluminum-magnesium hydroxide-simethicone (MAALOX) 200-200-20 mg/5 mL suspension Take 30 mLs by mouth 4 (four) times daily as needed for indigestion.Start date: 07/22/2023  bisacodyL (DULCOLAX) 10 mg suppository Place 1 suppository (10 mg total) rectally daily as needed for constipation.Qty: 12 suppositoryStart date: 07/22/2023  cefTRIAXone (ROCEPHIN) 1 g in sodium chloride 0.9% PF 0.9% 10 mL (100 mg/mL) 10 mLs (1 g total) by IV Push route every 24 hours.Start date: 07/23/2023  enoxaparin (LOVENOX) 40 mg/0.4 mL syringe Inject 0.4 mLs (40 mg total) under the skin daily.Qty: 5.6 mL, Refills: 0Start date: 07/23/2023  famotidine (PF) (PEPCID) 20 mg in sodium chloride 0.9% PF 0.9% 5 mL Injection 5 mLs (20 mg total) by IV Push route every 12 (twelve) hours.Start date: 07/22/2023  hyoscyamine (LEVSIN/SL) 0.125 mg sublingual tablet Place 1 tablet (125 mcg total) under the tongue every 4 (four) hours as needed for up to 10 days.Qty: 30 tabletStart date: 07/22/2023, End date: 08/01/2023  ondansetron (ZOFRAN-ODT) 4 mg disintegrating tablet Place 1 tablet (4 mg total) onto the tongue every 8 (eight) hours as needed for nausea for up to 7 days.Qty: 20 tablet, Refills: 0Start date: 07/22/2023, End date: 07/29/2023  ondansetron, PF, (ZOFRAN) 4 mg/2 mL Soln injection 2 mLs (4 mg total) by IV Push route every 8 (eight) hours as needed.Start date: 07/22/2023   CONTINUE these medications which have NOT CHANGED  Details amLODIPine (NORVASC) 2.5 mg tablet Take 1 tablet (2.5 mg total) by mouth daily.  sucralfate (CARAFATE) 1 gram tablet Take 1 tablet (1 g total) by mouth 4 times a day before meals.  ursodioL (URSO 250) 250 mg tablet Take 1 tablet (250 mg total) by mouth.   STOP taking these medications   ibuprofen (ADVIL,MOTRIN) 600 mg tablet    omeprazole (PRILOSEC) 40 mg capsule      D/w Dr Karl Bales: 60 minutes spent on the discharge of this patientElectronically Signed:Kobey Sides Ferol Luz, NP 07/22/2023 4:28 PMBest Contact Information:

## 2023-07-26 ENCOUNTER — Encounter: Admit: 2023-07-26 | Payer: MEDICAID | Attending: Surgery | Primary: Adult Health

## 2023-08-02 ENCOUNTER — Encounter: Admit: 2023-08-02 | Payer: MEDICAID | Primary: Adult Health

## 2023-08-02 DIAGNOSIS — K118 Other diseases of salivary glands: Secondary | ICD-10-CM

## 2023-08-02 NOTE — Progress Notes
CHIEF COMPLAINT:  White spots in mouth. HISTORY OF PRESENT ILLNESS:  Gina Aguilar is a 27 y.o. female referred by Dr. Referring for evaluation and management of the above chief complaint.Gina Aguilar is a 27 y.o. female who presented to [facility] ED with complaints of oral pain and swelling that has persisted for approximately 3 weeks. She reports that she had an endoscopy on 05/31/23 performed due to nausea, vomiting and constipation after having her gastric bypass surgery, symptoms have improved though she continues to have oral pain. She reports that she has pain while eating and swallowing. She was seen at her orthodontist for follow up on her braces when they noticed white spots on her hard palate and advised her to be seen in the ED.  She was discharged from the ED with instructions to follow a soft food diet and prescribed Augmentin.  She reports having trouble swallowing the Augmentin so she hasn't been able to take it due to the pain. She reports that the sore has begun draining and reports that she used scissors to open the sore due to the pain and states that she now has two holes in the roof of her mouth. She complains of having bad breath and not being able to socialize at work. Location: BilateralQuality:  WorseningTiming/Duration: 3 weeks. Modifying Factors: None.Associated Signs and Symptoms: Trouble swallowing, pain.Interval History 08/02/2023: Patient presents for follow up. Overall, doing better from an ENT perspective.Since last visit, patient was hospitalized for a UTI and an ulceration at her GJ anastomosis. From an ENT perspective, patient states ulcerations on her palate have continued to improve. Denies any pain or bleeding, but states they still cause a weird sensation when she eats and drinks. Tolerating a regular diet with good PO intake and good toleration. Denies any new or concerning oral cavity masses or lesions. Denies any otalgia, trismus, fevers, chills, night sweats, or new palpable cervical lymph nodes or masses. REVIEW OF SYSTEMS:  A comprehensive twelve system questionnaire was completed by the patient and I personally reviewed it with the patient.  Pertinent positives and negatives are noted in the history above.PAST MEDICAL HISTORY:  Gina Aguilar has a past medical history of BMI 60.0-69.9, adult (HC Code), Cancer (HC Code) (HC CODE) (HC Code) (2012), Depression (09/20/2012), Headache, OSA (obstructive sleep apnea) (10/17/2014), and PCOS (polycystic ovarian syndrome) (09/20/2012).PAST SURGICAL HISTORY:  Gina Aguilar has a past surgical history that includes Ovarian cyst removal (Right, 2018); Knee arthroscopy (Right, 02/2017); Abdominal surgery (2012); Dilation and curettage of uterus (04/2016); and Dental surgery.MEDICATIONS:  Reviewed and noted in EMR.ALLERGIES:   Patient has no known allergies.SOCIAL HISTORY:  Gina Aguilar  reports that she has never smoked. She has never used smokeless tobacco. She reports current alcohol use. She reports current drug use. Drug: Marijuana.Kingsley Aguilar is SingleFAMILY HISTORY:  Gina Aguilar family history includes Dementia in her paternal grandmother; Hypertension in her mother; Stroke (age of onset: 84) in her mother.   Additional details are in the EMR and have been reviewed.PHYSICAL EXAMINATION:Vital Signs:Vitals:  08/02/23 0835 BP: 134/81 Pulse: (!) 93 Temp: 98.7 ?F (37.1 ?C) Wt Readings from Last 3 Encounters: 07/21/23 134.4 kg 06/08/23 (!) 145 kg 08/30/22 (!) 186.4 kg General:  NAD, conversantEyes:  Anicteric sclerae, moist conjunctivae, no lid-lag, PER, EOMIHead:  atramautic, normocephalicEars: external ears WNLNose: external nose WNLMouth: Hard palate with symmetric peri-midline linear ulceration, improved since last exam in July 2024; area non-tender, non-friable to palpation; no further  abnormal masses or lesions; no trismus Cranial Nerves:  Cranial nerves III-XII are intact bilaterallyNeck: symmetric, trachea midline, FROM,  No evidence of mass or crepitus.  No lymphadenopathy.Respiratory:   Inspection of chest including symmetry, expansion, and assessment of respiratory effort is normal.Cardiovascular:  Evaluation of peripheral vascular system by observation and palpation of capillary refill is normal, and peripheral pulses are present in upper extremities.Abdomen:  Soft, non-tender, no masses,  No G-tubeExtremities: No peripheral edema or extremity lymphadenopathy.  Skin:  Normal temperature, turgor, and texture, no rashNeurologic:   Alert and oriented, mood and affect are normal to time and place Photo from 08/02/2023:(Photo from 06/08/23)(Photo from 06/21/23)PROCEDURE:06/21/23:Indication: Need to visualize full extent of nasal cavity beyond what is visible on anterior rhinoscopy and unable to tolerate indirect mirror laryngoscopy due to gag. Anesthetic and decongestant applied. Informed verbal consent is obtained, the 0 degree endoscope is passed via the left nasal cavity. The inferior turbinate is within normal limits. The osteomeatal complex is clear with no polyposis or purulence. The sphenoethmoidal recess is clear with no polyposis or purulence. The superior meatus is clear.  The superior turbinate is within normal limits. The choana is patent.  The 0 degree endoscope is passed via the right nasal cavity. The inferior turbinate is within normal limits. The osteomeatal complex is clear with no polyposis or purulence. The sphenoethmoidal recess is clear with no polyposis or purulence. The superior turbinate is within normal limits.  The superior meatus is clear. The choana is patent.  The septum is midline. The mucosa is in pink and healthy. No significant mucus. The scope was then advanced to evaluate the oropharynx and larynx. The oropharynx with without masses or lesions. the Supraglottis was within normal limits. Bilateral arytenoids and post-cricoid region, interarytenoid area within normal limits. Pyriform sinuses clear. Bilateral and symmetric movement of the vocal folds bilaterally, no masses or lesions. Patient tolerated procedure well.DATA REVIEW:  Relevant images and reports personally reviewed by me.Imaging Reviewed:[place] Facial Bones w IV ContrastResult Date: 6/26/2024CT FACIAL BONES W IV CONTRAST INDICATION: hard palate infection/congenital anomaly COMPARISON: [place] HEAD WO IV CONTRAST 2020-03-28 TECHNIQUE: Axial [place] images of the facial bones were performed without the administration of intravenous contrast.  Sagittal and coronal reformats were provided. FINDINGS: The hard palate appears irregular with areas of thinning/erosion as well as hyperostosis. There is submucosal edema without a discrete mass or drainable collection.Limited evaluation on [place]. There is slight streak artifact from braces. No acute fracture of the facial bones. TM joints are intact. Mild mucosal thickening in the paranasal sinuses without air-fluid levels. Mastoids are clear. Visualized portions of the orbits, upper cervical spine and intracranial structures appear unremarkable. Osseous and mucosal irregularity of the hard palate of uncertain etiology. Differential includes infection/inflammation/atypical torus palatinus with superimposed inflammation. No discrete soft tissue mass or drainable collection. Correlate with direct visualization and tissue sampling. [place] [facility] Notify System Classification: Routine. Report initiated by: Armando Gang, MD Reported and signed by: Etheleen Nicks, MD XR Neck Soft TissueResult Date: 6/26/2024CLINICAL INFORMATION: Difficulty swallowing after upper EGD. TECHNIQUE: Xr Neck Soft Tissue. COMPARISON: None. FINDINGS: Bones:  No acute osseous abnormalities. Soft tissues: Unremarkable.  Additional Comments: None.   Negative study. [facility] Center: Routine. Reported and signed by: Amie Critchley, MD XR Chest Special 1 or 2 ViewsResult Date: 6/17/2024CLINICAL INFORMATION: abdominal pain COMPARISON: Chest x-ray 09/16/2021 TECHNIQUE:  AP view of the chest. FINDINGS: Heart/Mediastinum:  Normal heart size.  Unremarkable mediastinum. Lungs:  No acute consolidation.  No pleural effusions. Osseous Structures:  No acute abnormalities.Negative study. [place] Abdomen+pelvis w/contrast w/POResult Date: 6/13/2024CT scan of the abdomen and pelvis. May 26, 2023 at 2148 hours Clinical History: History of gastric bypass 05/16/23, presents with nausea, vomiting, constipation, leukocytosis. Technique: Helical axial sections with sagittal and coronal reformats of the abdomen and pelvis were obtained with intravenous contrast. Iterative reconstruction technique was employed to reduce patient radiation exposure. Contrast Dose: Omnipaque IV. Radiation Dose: Total exam DLP 1569 mGy/cm. Findings: The lung bases are clear. There is a small hiatal hernia. The liver, gallbladder, pancreas, spleen and adrenals appear unremarkable. Bilateral kidneys appear unremarkable. No renal/ureteric calculus or hydronephrosis. Status post Roux-en-y gastric bypass. No evidence of bowel obstruction. No evidence of oral contrast leakage. The appendix is within normal limits. A moderate amount of fecal material is present in the ascending and transverse colon. Postsurgical changes  are seen in the anterior abdominal wall with air loculi within. The urinary bladder appears unremarkable. An intrauterine contraceptive device is noted in the lower uterine segment, likely misplaced. Bilateral ovaries with follicles are noted. No evidence of free fluid, free air or abscess. The abdominal aorta and its branches appear unremarkable. Mild degenerative changes are identified in the spine.1. Status post Roux-en-y gastric bypass. No evidence of oral contrast leakage. No definitive evidence of pneumoperitoneum. 2. Intrauterine contraceptive device in the lower uterine segment, likely undisplaced. Recommend clinical correlation. 3. Other findings as described above.[place] Non-OB Transvaginal & Limited TranspelvicResult Date: 4/26/2024CLINICAL INFORMATION: Endometrial hyperplasia TECHNIQUE: Sonographic evaluation of the pelvis was performed transabdominally and transvaginally using grayscale, pulsed wave and color Doppler. COMPARISON:  May 18, 2022 ultrasound, then transvaginal FINDINGS: Limited examination due to body habitus and gas Uterus: Measures 7.8 x 3.5 x 5.1 cm. The uterus demonstrates normal in size and echotexture. The endometrium measures up to 3 mm. There is an IUD seen, once again in the lower uterine segment. Right Ovary:  Measures  3.0 x 1.8 x 1.4 cm. The ovary is unremarkable. There is normal vascularity. Left Ovary:  Measures 4.6 x 2.9 x 3.0 cm. The ovary is unremarkable. There is normal vascularity. Peritoneum: There is small volume free fluid seen in the cul-de-sac.  IUD visualized within the lower uterine segment. Otherwise, unremarkable. Reported and signed by: Marica Otter, MD PathologyReviewed in Epic Pathology tabNone. 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 -Discussed natural history of this diagnosis. When comparing today's exam to photo taken on 06/21/2023, these ulcerative lesions do seem to continue to be improving. - Continue to avoid local trauma to area- Diligent oral care reinforced and encouraged- Follow up in 6-8 weeks for repeat check to ensure area has fully healedThe patient knows to contact me at any point with questions or concerns.Genoveva Ill, APRNDepartment of Otolaryngology - Head and Neck [phone]

## 2023-09-20 ENCOUNTER — Encounter: Admit: 2023-09-20 | Payer: MEDICAID | Primary: Adult Health

## 2023-10-18 ENCOUNTER — Ambulatory Visit: Admit: 2023-10-18 | Payer: PRIVATE HEALTH INSURANCE | Primary: Adult Health

## 2023-10-18 ENCOUNTER — Ambulatory Visit: Admit: 2023-10-18 | Payer: MEDICAID | Primary: Adult Health

## 2023-10-24 ENCOUNTER — Ambulatory Visit: Admit: 2023-10-24 | Payer: MEDICAID | Attending: Gynecologic Oncology | Primary: Adult Health

## 2024-09-08 ENCOUNTER — Emergency Department: Admit: 2024-09-08 | Payer: PRIVATE HEALTH INSURANCE

## 2024-09-08 ENCOUNTER — Inpatient Hospital Stay
Admission: EM | Admit: 2024-09-08 | Discharge: 2024-09-16 | Payer: PRIVATE HEALTH INSURANCE | Attending: Student in an Organized Health Care Education/Training Program | Admitting: Hospitalist

## 2024-09-08 LAB — HEPATIC FUNCTION PANEL
BKR A/G RATIO: 0.9 — ABNORMAL LOW (ref 1.0–2.2)
BKR ALANINE AMINOTRANSFERASE (ALT): 25 U/L (ref 10–35)
BKR ALBUMIN: 3.5 g/dL — ABNORMAL LOW (ref 3.6–5.1)
BKR ALKALINE PHOSPHATASE: 105 U/L (ref 9–122)
BKR ASPARTATE AMINOTRANSFERASE (AST): 29 U/L (ref 10–35)
BKR AST/ALT RATIO: 1.2
BKR BILIRUBIN DIRECT: 0.2 mg/dL (ref <=0.2)
BKR BILIRUBIN TOTAL: 0.5 mg/dL (ref <=1.2)
BKR GLOBULIN: 3.8 g/dL (ref 2.0–3.9)
BKR PROTEIN TOTAL: 7.3 g/dL (ref 5.9–8.3)

## 2024-09-08 LAB — BASIC METABOLIC PANEL
BKR ANION GAP: 12 (ref 7–17)
BKR BLOOD UREA NITROGEN: 15 mg/dL (ref 6–20)
BKR BUN / CREAT RATIO: 25 — ABNORMAL HIGH (ref 8.0–23.0)
BKR CALCIUM: 9.6 mg/dL (ref 8.8–10.2)
BKR CHLORIDE: 102 mmol/L (ref 98–107)
BKR CO2: 26 mmol/L (ref 20–30)
BKR CREATININE: 0.6 mg/dL (ref 0.40–1.30)
BKR EGFR, CREATININE (CKD-EPI 2021): >60 mL/min/1.73m2 (ref >=60)
BKR GLUCOSE: 91 mg/dL (ref 70–100)
BKR POTASSIUM: 4.1 mmol/L (ref 3.3–5.3)
BKR SODIUM: 140 mmol/L (ref 136–144)

## 2024-09-08 LAB — LIPASE: BKR LIPASE: 87 U/L — ABNORMAL HIGH (ref 11–55)

## 2024-09-08 LAB — MAGNESIUM: BKR MAGNESIUM: 2 mg/dL (ref 1.7–2.4)

## 2024-09-08 LAB — PHOSPHORUS     (BH GH L LMW YH): BKR PHOSPHORUS: 4 mg/dL (ref 2.2–4.5)

## 2024-09-08 MED ORDER — ONDANSETRON HCL (PF) 4 MG/2 ML INJECTION SOLUTION
4 | Freq: Once | INTRAVENOUS | Status: CP
Start: 2024-09-08 — End: ?
  Administered 2024-09-08: 4 mL via INTRAVENOUS

## 2024-09-08 MED ORDER — LACTATED RINGERS IV BOLUS NEW BAG (DROPS CHARGE)
Freq: Once | INTRAVENOUS | Status: CP
Start: 2024-09-08 — End: ?
  Administered 2024-09-08: 1000.000 mL/h via INTRAVENOUS

## 2024-09-08 MED ORDER — ACETAMINOPHEN 325 MG TABLET
325 | Freq: Once | ORAL | Status: CP
Start: 2024-09-08 — End: ?
  Administered 2024-09-09: 02:00:00 325 mg via ORAL

## 2024-09-09 LAB — CBC WITH AUTO DIFFERENTIAL
BKR WAM ABSOLUTE IMMATURE GRANULOCYTES.: 0.02 x 1000/ÂµL (ref 0.00–0.30)
BKR WAM ABSOLUTE LYMPHOCYTE COUNT.: 1.75 x 1000/ÂµL (ref 0.60–3.70)
BKR WAM ABSOLUTE NRBC: 0 x 1000/ÂµL (ref 0.00–1.00)
BKR WAM ANC (ABSOLUTE NEUTROPHIL COUNT): 4.16 x 1000/ÂµL (ref 2.00–7.60)
BKR WAM BASOPHIL ABSOLUTE COUNT.: 0.02 x 1000/ÂµL (ref 0.00–1.00)
BKR WAM BASOPHILS: 0.3 % (ref 0.0–1.4)
BKR WAM EOSINOPHIL ABSOLUTE COUNT.: 0.05 x 1000/ÂµL (ref 0.00–1.00)
BKR WAM EOSINOPHILS: 0.8 % (ref 0.0–5.0)
BKR WAM HEMATOCRIT: 35.5 % (ref 35.00–45.00)
BKR WAM HEMOGLOBIN: 11.4 g/dL — ABNORMAL LOW (ref 11.7–15.5)
BKR WAM IMMATURE GRANULOCYTES: 0.3 % (ref 0.0–1.0)
BKR WAM LYMPHOCYTES: 27 % (ref 17.0–50.0)
BKR WAM MCH: 32.9 pg (ref 27.0–33.0)
BKR WAM MCHC: 32.1 g/dL (ref 31.0–36.0)
BKR WAM MCV: 102.6 fL — ABNORMAL HIGH (ref 80.0–100.0)
BKR WAM MONOCYTE ABSOLUTE COUNT.: 0.48 x 1000/ÂµL (ref 0.00–1.00)
BKR WAM MONOCYTES: 7.4 % (ref 4.0–12.0)
BKR WAM MPV: 9.3 fL (ref 8.0–12.0)
BKR WAM NEUTROPHILS: 64.2 % (ref 39.0–72.0)
BKR WAM NUCLEATED RED BLOOD CELLS: 0 % (ref 0.0–1.0)
BKR WAM PLATELETS: 540 x1000/ÂµL — ABNORMAL HIGH (ref 150–420)
BKR WAM RDW-CV: 15 % (ref 11.0–15.0)
BKR WAM RED BLOOD CELL COUNT.: 3.46 M/ÂµL — ABNORMAL LOW (ref 4.00–6.00)
BKR WAM WHITE BLOOD CELL COUNT: 6.5 x1000/ÂµL (ref 4.0–11.0)

## 2024-09-09 LAB — BASIC METABOLIC PANEL
BKR ANION GAP: 11 (ref 7–17)
BKR BLOOD UREA NITROGEN: 12 mg/dL (ref 6–20)
BKR BUN / CREAT RATIO: 20 (ref 8.0–23.0)
BKR CALCIUM: 9.3 mg/dL (ref 8.8–10.2)
BKR CHLORIDE: 106 mmol/L (ref 98–107)
BKR CO2: 24 mmol/L (ref 20–30)
BKR CREATININE DELTA: 0
BKR CREATININE: 0.6 mg/dL (ref 0.40–1.30)
BKR EGFR, CREATININE (CKD-EPI 2021): >60 mL/min/1.73m2 (ref >=60)
BKR GLUCOSE: 87 mg/dL (ref 70–100)
BKR POTASSIUM: 3.5 mmol/L (ref 3.3–5.3)
BKR SODIUM: 141 mmol/L (ref 136–144)

## 2024-09-09 LAB — URINALYSIS WITH CULTURE REFLEX      (BH LMW YH)
BKR BILIRUBIN, UA: NEGATIVE
BKR BLOOD, UA: NEGATIVE
BKR GLUCOSE, UA: NEGATIVE
BKR KETONES, UA: NEGATIVE
BKR LEUKOCYTE ESTERASE, UA: NEGATIVE
BKR NITRITE, UA: NEGATIVE
BKR PH, UA: 6.5 (ref 5.5–7.5)
BKR SPECIFIC GRAVITY, UA: 1.028 (ref 1.005–1.030)
BKR UROBILINOGEN, UA: 3 mg/dL — ABNORMAL HIGH (ref <=2.0)

## 2024-09-09 LAB — HCG, QUANTITATIVE     (BH GH LMW YH): BKR HCG, QUANTITATIVE: <1 m[IU]/mL

## 2024-09-09 LAB — PHOSPHORUS     (BH GH L LMW YH)
BKR PHOSPHORUS: 4.1 mg/dL (ref 2.2–4.5)
BKR PHOSPHORUS: 3.8 mg/dL (ref 2.2–4.5)

## 2024-09-09 LAB — URINE MICROSCOPIC     (BH GH LMW YH)
BKR RBC/HPF, UA (INSTRUMENT): <1 /HPF (ref 0–2)
BKR URINE SQUAMOUS EPITHELIAL CELLS, UA (INSTRUMENT): 5 /HPF (ref 0–5)
BKR WBC/HPF, UA (INSTRUMENT): 4 /HPF (ref 0–5)

## 2024-09-09 LAB — UA REFLEX CULTURE

## 2024-09-09 LAB — VITAMIN B12: BKR VITAMIN B12: 669 pg/mL (ref 232–1245)

## 2024-09-09 LAB — MAGNESIUM
BKR MAGNESIUM: 1.9 mg/dL (ref 1.7–2.4)
BKR MAGNESIUM: 2 mg/dL (ref 1.7–2.4)

## 2024-09-09 LAB — FOLATE: BKR FOLATE: 14.6 ng/mL

## 2024-09-09 LAB — TSH W/REFLEX TO FT4     (BH GH LMW Q YH): BKR THYROID STIMULATING HORMONE: 0.655 u[IU]/mL

## 2024-09-09 MED ORDER — THERAPEUTIC-M 9 MG IRON-400 MCG TABLET
9 | ORAL | 0.00 refills | 60.00000 days | Status: AC
Start: 2024-09-09 — End: ?

## 2024-09-09 MED ORDER — OXYCODONE 5 MG/5 ML ORAL SOLUTION
5 | Freq: Four times a day (QID) | GASTROSTOMY | 1.00 refills | 3.50000 days | Status: AC | PRN
Start: 2024-09-09 — End: 2024-09-16

## 2024-09-09 MED ORDER — MULTIVITAMIN-IRON 9 MG-FOLIC ACID 400 MCG-CALCIUM AND MINERALS TABLET
9 | ORAL | Status: DC
Start: 2024-09-09 — End: 2024-09-10
  Administered 2024-09-10: 09:00:00 9 mg iron-400 mcg via ORAL

## 2024-09-09 MED ORDER — DIAZEPAM 5 MG TABLET
5 | Freq: Four times a day (QID) | ORAL | 1.00 refills | 28.00000 days | Status: AC | PRN
Start: 2024-09-09 — End: 2024-09-16

## 2024-09-09 MED ORDER — DIAZEPAM 2 MG TABLET
2 | Freq: Four times a day (QID) | ORAL | Status: DC | PRN
Start: 2024-09-09 — End: 2024-09-10

## 2024-09-09 MED ORDER — SUCRALFATE 100 MG/ML ORAL SUSPENSION
100 | 0.00 refills | 30.00000 days | Status: AC
Start: 2024-09-09 — End: ?

## 2024-09-09 MED ORDER — PREGABALIN 50 MG CAPSULE
50 | ORAL | 0.00 refills | 30.00000 days | Status: AC
Start: 2024-09-09 — End: ?

## 2024-09-09 MED ORDER — SUCRALFATE 100 MG/ML ORAL SUSPENSION
100 | Freq: Two times a day (BID) | ORAL | Status: DC
Start: 2024-09-09 — End: 2024-09-09
  Administered 2024-09-09: 10:00:00 100 mL via ORAL

## 2024-09-09 MED ORDER — SPIRONOLACTONE 25 MG TABLET
25 | Freq: Every day | ORAL | Status: DC
Start: 2024-09-09 — End: 2024-09-11

## 2024-09-09 MED ORDER — ENOXAPARIN 40 MG/0.4 ML SUBCUTANEOUS SYRINGE
40 | SUBCUTANEOUS | Status: DC
Start: 2024-09-09 — End: 2024-09-16
  Administered 2024-09-09 – 2024-09-16 (×8): 40 via SUBCUTANEOUS

## 2024-09-09 MED ORDER — DROPERIDOL 2.5 MG/ML INJECTION SOLUTION
2.5 | Freq: Once | INTRAVENOUS | Status: CP
Start: 2024-09-09 — End: ?
  Administered 2024-09-09: 02:00:00 2.5 mg/mL via INTRAVENOUS

## 2024-09-09 MED ORDER — OXYCODONE IMMEDIATE RELEASE 5 MG TABLET
5 | Freq: Four times a day (QID) | GASTROSTOMY | 1 refills | Status: DC | PRN
Start: 2024-09-09 — End: 2024-09-14
  Administered 2024-09-09 – 2024-09-14 (×15): 5 mg via GASTROSTOMY

## 2024-09-09 MED ORDER — POLYETHYLENE GLYCOL 3350 17 GRAM ORAL POWDER PACKET
17 | Freq: Every day | ORAL | Status: DC | PRN
Start: 2024-09-09 — End: 2024-09-10
  Administered 2024-09-09: 10:00:00 17 g via ORAL

## 2024-09-09 MED ORDER — PANTOPRAZOLE 40 MG INTRAVENOUS SOLUTION
40 | Freq: Two times a day (BID) | INTRAVENOUS | Status: DC
Start: 2024-09-09 — End: 2024-09-09
  Administered 2024-09-09: 05:00:00 40 mL via INTRAVENOUS

## 2024-09-09 MED ORDER — FLU VACCINE TS 2025-26(6MOS UP)(PF) 45 MCG(15MCG X3)/0.5 ML IM SYRINGE
45 | INTRAMUSCULAR | Status: CP
Start: 2024-09-09 — End: ?
  Administered 2024-09-09: 10:00:00 45 mL via INTRAMUSCULAR

## 2024-09-09 MED ORDER — PREGABALIN 50 MG CAPSULE
50 | Freq: Two times a day (BID) | ORAL | Status: DC
Start: 2024-09-09 — End: 2024-09-10
  Administered 2024-09-09 – 2024-09-10 (×2): 50 mg via ORAL

## 2024-09-09 MED ORDER — METOCLOPRAMIDE 5 MG/ML INJECTION SOLUTION
5 | Freq: Once | INTRAVENOUS | Status: CP
Start: 2024-09-09 — End: ?
  Administered 2024-09-09: 01:00:00 5 mL via INTRAVENOUS

## 2024-09-09 MED ORDER — SODIUM CHLORIDE 0.9 % (FLUSH) INJECTION SYRINGE
0.9 | Freq: Three times a day (TID) | INTRAVENOUS | Status: DC
Start: 2024-09-09 — End: 2024-09-16
  Administered 2024-09-09 – 2024-09-15 (×13): 0.9 mL via INTRAVENOUS

## 2024-09-09 MED ORDER — FUROSEMIDE 20 MG TABLET
20 | Freq: Every day | ORAL | Status: DC
Start: 2024-09-09 — End: 2024-09-11

## 2024-09-09 MED ORDER — SODIUM CHLORIDE 0.9 % (FLUSH) INJECTION SYRINGE
0.9 | INTRAVENOUS | Status: DC | PRN
Start: 2024-09-09 — End: 2024-09-16

## 2024-09-09 MED ORDER — MORPHINE 4 MG/ML INTRAVENOUS SOLUTION
4 | Freq: Once | INTRAVENOUS | Status: CP
Start: 2024-09-09 — End: ?
  Administered 2024-09-09: 02:00:00 4 mL via INTRAVENOUS

## 2024-09-09 MED ORDER — SCOPOLAMINE 1 MG OVER 3 DAYS TRANSDERMAL PATCH
1 | TRANSDERMAL | 0.00 refills | Status: AC
Start: 2024-09-09 — End: ?

## 2024-09-09 MED ORDER — SPIRONOLACTONE 50 MG TABLET
50 | ORAL | 0.00 refills | 30.00000 days | Status: AC
Start: 2024-09-09 — End: ?

## 2024-09-09 MED ORDER — PANTOPRAZOLE 40 MG TABLET,DELAYED RELEASE
40 | Freq: Two times a day (BID) | JEJUNOSTOMY | Status: DC
Start: 2024-09-09 — End: 2024-09-10
  Administered 2024-09-09 – 2024-09-10 (×2): 40 mg via JEJUNOSTOMY

## 2024-09-09 MED ORDER — OMEPRAZOLE 40 MG CAPSULE,DELAYED RELEASE
40 | Freq: Every day | ORAL | 0.00 refills | 30.00000 days | Status: AC
Start: 2024-09-09 — End: 2024-09-16

## 2024-09-09 MED ORDER — SUCRALFATE 100 MG/ML ORAL SUSPENSION
100 | Freq: Before meals | ORAL | Status: DC
Start: 2024-09-09 — End: 2024-09-16
  Administered 2024-09-09 – 2024-09-16 (×27): 100 mL via ORAL

## 2024-09-09 MED ORDER — FUROSEMIDE 20 MG TABLET
20 | Freq: Every day | ORAL | 0.00 refills | 90.00000 days | Status: AC
Start: 2024-09-09 — End: ?

## 2024-09-09 MED ORDER — MORPHINE 4 MG/ML INTRAVENOUS SOLUTION
4 | Freq: Once | INTRAVENOUS | Status: CP
Start: 2024-09-09 — End: ?
  Administered 2024-09-09: 01:00:00 4 mL via INTRAVENOUS

## 2024-09-09 MED ORDER — ONDANSETRON 4 MG DISINTEGRATING TABLET
4 | Freq: Three times a day (TID) | ORAL | Status: DC | PRN
Start: 2024-09-09 — End: 2024-09-10

## 2024-09-09 MED ORDER — POLYETHYLENE GLYCOL 3350 17 GRAM ORAL POWDER PACKET
17 | Freq: Every day | ORAL | 0.00 refills | 14.00000 days | Status: AC | PRN
Start: 2024-09-09 — End: ?

## 2024-09-09 MED ORDER — ONDANSETRON 8 MG DISINTEGRATING TABLET
8 | Freq: Three times a day (TID) | ORAL | 1.00 refills | 7.00000 days | Status: AC | PRN
Start: 2024-09-09 — End: ?

## 2024-09-09 MED ORDER — DEXTROSE 5 % AND 0.9 % SODIUM CHLORIDE INTRAVENOUS SOLUTION
INTRAVENOUS | Status: AC
Start: 2024-09-09 — End: ?
  Administered 2024-09-09 (×2): 1000.000 mL/h via INTRAVENOUS

## 2024-09-09 MED ORDER — SCOPOLAMINE 1 MG OVER 3 DAYS TRANSDERMAL PATCH
1 | TRANSDERMAL | Status: DC
Start: 2024-09-09 — End: 2024-09-16

## 2024-09-09 MED ORDER — CALCIUM 600 MG (AS CARBONATE)-VIT D3 10 MCG (400 UNIT)-MINERALS TABLET
600 | Freq: Every day | ORAL | 0.00 refills | 50.00000 days | Status: AC
Start: 2024-09-09 — End: ?

## 2024-09-09 MED ORDER — CYCLOBENZAPRINE 5 MG TABLET
5 | ORAL | 1.00 refills | 14.50000 days | Status: AC
Start: 2024-09-09 — End: ?

## 2024-09-09 MED ORDER — SENNOSIDES 8.6 MG-DOCUSATE SODIUM 50 MG TABLET
8.6-50 | Freq: Every day | ORAL | 0.00 refills | 30.00000 days | Status: AC
Start: 2024-09-09 — End: ?

## 2024-09-09 NOTE — ED Notes
 11:09 PM 28 yo female BIBA from friend's home presents to the ED w/ c/o ABD pain w/ N/V starting on Wednesday. Pt has hx of ulcers, gastric bypass and had a G-tube placed 2 months ago. PT was recently hospitalized for malnutrition and dehydration and was discharged on Wednesday. Ever since then, PT has been experiencing nausea and cyclical vomiting. PT reports blood in her emesis. Additionally, PT reports difficulty with feedings through her G-tube. Last feeding was yesterday at 6pm. PT arrives to the room sleepy but arousable to repeated verbal stimuli. Connected to tele monitor w/ SpO2 and BP monitoring. AOX4, airway intact. Speaking in clear and complete sentences. PIV placed. Chief Complaint Patient presents with  Emesis   To ED for lethargy and emesis. Recent hospitalization for malnutrition and dehydration. Arouses to repeated tactile stimuli. HR 101 fsbg 91 12:50 AM Assisted on to the bedpan per PT's request but PT was unable to have a BM. 1:49 AMPT had multiple episodes of emesis. Coffee ground emesis noted. MD McGinniss made aware, see new orders. Meds administered per [facility]. 2:58 AMHospitalist at bedside. 3:20 AMPT resting comfortably on stretcher with chest rising and falling equally and bilaterally. Easily arousable to verbal stimuli. Airway intact and NAD. Self-repositions for comfort.  Pending transport to bring PT to admission bed.   Floor Handoff Telemetry: 	[]  Yes		[x]  NoCode Status:   []  Full		[]  DNR		[]  DNI		Other (specify): PriorSafety Precautions: [x]  Fall Risk  []  Sitter   []  Restraints	[]  Suicidal	[]  None	Other (specify):Mentation/Orientation:	 A&O (Self, person, place, time) x     4     	 Disoriented to:                    	 Special Accommodations: []  Hearing impaired   []  Blind  []  Nonverbal  []  Cognitive impairmentOxygenation Upon Admission: []  RA	[]  NC	[]  Venti  []  Simple Mask []  Other	Baseline O2 Status? []  Yes	[]  NoAmbulation: [x]  Independent	[]  Cane   []  Walker	[]  Wheelchair	[]  Bedbound		[]  Hemiplegic	[]  Paraplegic	[]  QuadraplegicEliminiation: []  Independent	[]  Commode	[x]  Bedpan/Urinal  []  Straight Cath []  Foley cath			[]  Urostomy	[]  Colostomy	Other (specify):Diarrhea/Loose stool : []  1x within 24h  []  2x within 24h  []  3x within 24h  []  None 	C.Diff Order: 	[]  Ordered- needs to be collected             []  Collected-sent to lab             []  Resulted - Negative C.Diff             []  Resulted - Positive C.Diff[]  Not Ordered   [x]  N/ASkin Alteration: []  Pressure Injury []  Wound []  None [x]  Skin not assessedDiet: []  Regular/No order placed	[x]  NPO		Other (specify):IV Access: [x]  PIV   []  PICC    []  Port    [] Central line    []  A-line    Other (specify)IVF/GTT Running Upon ED Departure? []  No	    [x]  Yes (specify): LR bolusOutstanding Meds/Treatments/Tests: D5NS infusion and protonixMeds Not Given: (as of time of note) 1:49 AM  		                                                 		Valuables Noted:  []  Dentures   []  Glasses  []  Hearing Aids []  None   Guadalupe Kandee Grain, RN	Nurse-Provider Rounding on [facility] AdmissionsPrimary ED Provider: MD McGinnissIs the Patient and Family Aware of Status: [x]  YesPlan of Care Discussed: []  Yes	Plan  of Care Goals:Concern for Sepsis?     []  Yes	[x]  NoIf Yes, Interventions Initiated to Address Sepsis: []  Blood Cultures and Initial Lactate[]  Fluids[]  Antibiotics[]  Repeat Lactate

## 2024-09-09 NOTE — Plan of Care
 Problem: Adult Inpatient Plan of CareGoal: Plan of Care ReviewOutcome: Interventions implemented as appropriateGoal: Patient-Specific Goal (Individualized)Outcome: Interventions implemented as appropriateGoal: Absence of Hospital-Acquired Illness or InjuryOutcome: Interventions implemented as appropriateGoal: Optimal Comfort and WellbeingOutcome: Interventions implemented as appropriateGoal: Readiness for Transition of CareOutcome: Interventions implemented as appropriate Plan of Care Overview/ Patient StatusAlert and oriented. Pleasant, cooperative. Oxycodone  given for abd pain with good effect. Using call bell for needs. Tolerating clears. Has not c/o nausea. PEG clamped. Used for meds/flushed/ patent. No tube feeds ordered as of yet. OOB ad lib. Voiding. Urines sent. No BM today. IVFs continue. All meds/POC explained. No d/c date.

## 2024-09-09 NOTE — Progress Notes
 [facility]	 [facility]	Daily Progress NoteHospital Medicine ServiceAttending Provider: Tery Meth, MD [phone]:  [id number] Day #0Patient seen and examined on 09/09/2024, 2:59 PM.CC: Failure to thrive in adultInterim History: -NAEO-feeling better, willing to advance diet as n/v has improved  Objective  Current Meds: enoxaparin  prophylaxis dosing, 40 mg, Daily[Held by provider] furosemide, 20 mg, Dailypantoprazole, 40 mg, Q12Hsodium chloride, 3 mL, Q8H[Held by provider] spironolactone , 50 mg, Dailysucralfate, 1 g, AC & HS Vitals:Temp:  [97.7 ?F (36.5 ?C)-99.5 ?F (37.5 ?C)] 97.7 ?F (36.5 ?C)Pulse:  [88-102] 101Resp:  [16-18] 16BP: (98-143)/(60-85) 98/60SpO2:  [98 %-100 %] 100 %Device (Oxygen Therapy): room airI/O's:Intake/Output Summary (Last 24 hours) at 09/09/2024 1459Last data filed at 09/09/2024 1400Gross per 24 hour Intake 360 ml Output 300 ml Net 60 ml  Physical Exam GEN: NAD, conversational, pleasant, alertEYES: no scleral icterus or conjunctival injectionCV: RRR, no m/r/gPULM: CTABABD: abdomen soft, bowel sounds present, TTP around G tube site, dressing c/d/iEXT: no BLE pitting edemaNEURO: Aox4, no FNDLabs:I have reviewed the patient's pertinent labs as resulted in the EMR.Comorbidities present on admission:Secondary diagnoses occurring during hospitalization: Imaging:CXR (portable)Result Date: 9/27/2025XR CHEST PA OR AP INDICATION: abd pain COMPARISON: 09/04/2019 FINDINGS:   Limited by rotation The cardiomediastinal silhouette is normal. The lungs are clear. The pleural spaces are clear. There is no acute osseous injury.  Limited study No acute cardiothoracic abnormality. [facility] Notify System Classification: Routine. Reported and signed by: Margarita V Revzin, MD  [facility]ical [facility] I reviewed lab results in formulating this patient's plan of care.   Assessment: 28 y.o. female PMHx obesity s/p Roux-en-Y w/ multiple complications including perforation of marginal ulcer iso nonadherence w/ PPI w/ Arlyss patch, stricture requiring several EGD dilatations, multiple hospitalizations for related complaints including chronic abd pain, n/v most recently 9/9-23 iso poor nutritional status, s/p PEG for TFs but tolerating PO intermittently iso n/v, abd pain p/w n/v, abd pain, lethargyPlan: #Hx Roux-en-Y#Hx Marginal ulcer#Abd pain#Nausea/vomiting Complex history following gastric bypass with multiple hospitalizations for abd pain, n/v. S/p droperidol for nausea, now improved-de-escalate from protonix  IV BID to 40 mg PO BID via PEG-c/w home sucralfate  QID-zofran  prn nausea-oxycodone  5mg  q6h PRN-advance diet to clear liqs-check refeeding labs BID#Protein malnutrition#s/p PEG-f/u Nutrition consult for TF recs-previously dc'd tolerating 4-5 boluses Jevity up to 240 cc each per day-home PT and nursing services were arranged #Macrocytic anemia#Fatigue#LethargyLikely iso anemia, poor nutrition-f/u Iron panel, retic [place], blood smear-f/u A1c, folate, B12, MMA #Portal HTN, non-cirrhotic#Ascites; unclear etiologyHas has ascites of unclear etiology. Last seen by [facility] 08/13/2024. Previous doppler u/s w/ r/o of hepatic vein thrombsis. No previous evidence of SBP-hold lasix and spironolactone  pending PO intake-consider GI consultDiet Clear LiquidVTE PPx: LovenoxMedication Reconciliation: Completed: by Provider Communication: Family:  Discharge Readiness: Expected discharge timeframe: Likely 2-3 daysExpected discharge location: Home with services Full CodeSigned:Trinidy Masterson Cyrena, MDYale Traditional Internal Medicine, PGY-3Available on MHBSeptember 28, 20252:59 PM Please await attending attestation

## 2024-09-09 NOTE — ED Provider Notes
 Chief Complaint Patient presents with  Emesis   To ED for lethargy and emesis. Recent hospitalization for malnutrition and dehydration. Arouses to repeated tactile stimuli. HR 101 fsbg 91 MDM  Physical ExamED Triage Vitals [09/08/24 2305]BP: (!) 143/85Pulse: (!) 102Pulse from  O2 sat: n/aResp: 16Temp: 99.5 ?F (37.5 ?C)Temp src: OralSpO2: 99 % BP (!) 143/85  - Pulse (!) 102  - Temp 99.5 ?F (37.5 ?C) (Oral)  - Resp 16  - SpO2 99% Physical Exam ProceduresAttestation/Critical CarePatient Reevaluation: =======================================Medical Decision Making:09/09/2024 2:50 AMPatient is a 28 years old with complex past medical history including gastric bypass surgery which was complicated by starvation severe weight loss malnutrition as well as recent G-tube placement at [facility] she is presenting tonight with nausea and vomiting as well as what she describes as further weight loss and difficulty with her feeding tube regimen denies fevers chills chest pain shortness of breath diarrhea she is passing gas she denies abdominal pain but states that the G-tube site is slightly uncomfortable.G-tube site clean dry and intact there is no purulence erythema warmth or discharge from the site her abdomen is soft nondistended nontender she is awake alert oriented with GCS of 15 her vital signs are normal she is afebrile though mildly tachycardic she does appear malnourished with temporal cachexia/wastingPatient having difficulty at home believe she would benefit from inpatient management further evaluation and possible outpatient visiting nursing services or rehabI do not believe she has an acute bowel obstruction intra-abdominal infection perforation or cardiopulmonary etiology for presentation today we provided nausea medications, her workup is unrevealing for severe electrolyte abnormality or signs of infection or anemiaStable for admission to medical floorI considered an acute/life threatening illness during today's encounter.I considered hospitalization for further management of this patient's condition. Zachary Moros, MDEmergency Medicine9/28/2025 2:50 AM=======================================Clinical Impressions as of 09/09/24 0253 Failure to thrive in adult  ED DispositionAdmit  Moros Zachary, MD09/28/25 9746

## 2024-09-09 NOTE — Utilization Review (ED)
 Utilization Review from XsolisPatient Data:  Patient Name: Gina Aguilar Age: 28 y.o. DOB: Jan 04, 1996	 MRN: [id number]	 Indicated Status: Inpatient Review Comments: Pt with hx of gastric bypass surgery which was complicated by starvation severe weight loss malnutrition as well as recent G-tube placement at [facility] she is presenting tonight with nausea and vomiting , poor PO intake/failure to thrive.

## 2024-09-09 NOTE — H&P
 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 Has had multiple hospitalizations following gastric bypass, typically treated at [facility], with abd pain, nausea, vomiting being common symptoms. Most recently admitted 9/9 to 9/23 iso poor nutritional status, G tube placed in OR and oral and tube feeds tolerated (although the latter not to target goal given abd pain, nausea, and vomiting). Was ultimately dc'd tolerating 4-5 boluses Jevity up to 240 cc each per day, and home PT and nursing services were arranged. ED Course: tylenol , droperidol, LR x 1 L, reglan , morphine , zofranReview of Systems: the remainder of the ten-point review of systems has been reviewed and is negative. Medical history: per HPISurgical history: chole, Roux-en-YSocial history: pt denies tobacco alcohol or drug useFamily history: non-contributory  Medications prior to admission Prior to Admission Medication Medication Sig Dispense Refill  calcium carbonate-vit D3-min 600 mg-10 mcg (400 unit) per tablet Take 1 tablet by mouth daily.    cyclobenzaprine (FLEXERIL) 5 mg tablet Take 1 tablet (5 mg total) by mouth 3 (three) times a day.    diazePAM (VALIUM) 5 mg tablet Take 1 tablet (5 mg total) by mouth every 6 (six) hours as needed.    furosemide (LASIX) 20 mg tablet Take 1 tablet (20 mg total) by mouth daily.    omeprazole  (PRILOSEC) 40 mg capsule Take 1 capsule (40 mg total) by mouth daily.    ondansetron  (ZOFRAN -ODT) 8 mg disintegrating tablet Take 1 tablet (8 mg total) by mouth every 8 (eight) hours as needed.    oxyCODONE  (ROXICODONE ) 5 mg/5 mL solution 5 mLs (5 mg total) by Per G Tube route every 6 (six) hours as needed.    polyethylene glycol (MIRALAX) 17 gram packet Take 1 packet (17 g total) by mouth daily as needed for constipation.    pregabalin (LYRICA) 50 mg capsule Take 1 capsule (50 mg total) by mouth 2 (two) times a day.    scopolamine (TRANSDERM-SCOP) 1 mg transdermal patch Place 1 patch (1 mg total) onto the skin every third day.    senna-docusate (SENNA-PLUS) 8.6-50 mg tablet Take 2 tablets by mouth daily.    spironolactone  (ALDACTONE ) 50 mg tablet take 1 tablet (50 mg total) by mouth every morning before breakfast    sucralfate  (CARAFATE ) 100 mg/mL suspension Take 10 mL (1 g total) by mouth 4 (four) times a day before meals and nightly. On an empty stomach.    THERAPEUTIC-M 9 mg iron-400 mcg per tablet Take 1 tablet by mouth daily.     Allergies Allergies[1]  VITAL SIGNSTemp:  [99.5 ?F (37.5 ?C)] 99.5 ?F (37.5 ?C)Pulse:  [102] 102Resp:  [16] 16BP: (143)/(85) 143/85SpO2:  [99 %] 99 %Device (Oxygen Therapy): room air on WEIGHTS:Wt Readings from Last 5 Encounters: 07/21/23 134.4 kg 06/08/23 (!) 145 kg 08/30/22 (!) 186.4 kg 02/08/22 (!) 184.2 kg 08/10/21 (!) 182.8 kg  Intake and output: URINE OUTPUT/24 hoursINTAKENET  PHYSICAL EXAMGEN: nad, lethargic requiring repeat stimulation (verbal/tactile) to awaken but responds to questions appropriatelyHNT: nc/atEYES: no scleral icterus or conjunctival injectionCV: rrr, no mrgPULM: ctab w good inspiratory/expiratory effortABD: abdomen soft, bowel sounds  present, no tenderness or distension; feeding tube presentEXT: no pitting edemaMSK: no tenderness b/l LEsNEURO: follows instructions and answers questions appropriately, no intention or resting tremorsVASC: radial pulses 2+ bilaterallyDERM: no obvious acute skin traumaLABSRecent Labs Lab 09/27/252328 NA 140 K 4.1 CL 102 CO2 26 BUN 15 CREATININE 0.60 GLU 91 ANIONGAP 12  Recent Labs Lab 09/27/252328 CALCIUM 9.6 MG 2.0 PHOS 4.0  Recent Labs Lab 09/27/252328 WBC 6.5 HGB 11.4* HCT 35.50 PLT 540*  Recent Labs Lab 09/27/252328 NEUTROPHILS 64.2  Recent Labs Lab 09/27/252328 ALT 25 AST 29 ALKPHOS 105 BILITOT 0.5 BILIDIR 0.2 PROT 7.3 ALBUMIN 3.5*  No results for input(s): PTT, LABPROT, INR in the last 168 hours. No results for input(s): TROPONINT in the last 168 hours. Recent Labs Lab 09/27/252328 GLU 91  DIAGNOSTICSCXR (portable) Final Result  Limited study No acute cardiothoracic abnormality.  [facility] Radiology Notify System Classification: Routine.  Reported and signed by: Margarita V Revzin, MD    [facility] Radiology and Biomedical Imaging   I have personally seen and reviewed the above imaging. 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 Has had complex history following gastric bypass with multiple hospitalizations for abd pain. * protonix  40 mg IV bid for now - can switch to po when mental status improves* NPO and hold tube feeds pending symptom improvement* zofran  prn nausea* D5-NS overnight* monitor lytes especially phosLethargy* complaint present prior to admission (and pain treated morphine , severe nausea w droperidol)* will minimize sedating meds as able while still trying to control symptoms* check urine tox and UA* fall precautionsPortal HTN, non-cirrhoticAvoid excess fluid input * hold lasix and spironolactone  pending PO intakeDiet: last admission tolerated Jevity 240 ml boluses 4-5 times per day along with small amounts of PO intakeActivity: fall precautionsDVT ppx: lovenox  Code status: fullMed rec: completeSigned:Lashena Signer, MDAttending physician, internal medicineAvailable on mobile heartbeat during night shift hoursDate: 9/28/2025Time: 3:17 AM [1] AllergiesAllergen Reactions  Lidocaine  Hives and Itching

## 2024-09-09 NOTE — Other
 PHARMACY-ASSISTED MEDICATION REPORTPharmacist review of the best possible medication history obtained by the pharmacy medication history technician has been performed.  I have updated the home medication list and identified the following information that may be relevant to this admission.NOTES/RECOMMENDATIONSConsider resuming home cyclobenzaprine, furosemide, pregabalin, scopolamine, and spironolactone  as clinically indicatedSucralfate is currently ordered for 1 g twice daily, consider resuming home dose 1 g four times daily before meals and nightly       Prior to Admission Medications Medication Name Sig Taking? Patient Reported   Discontinued: 09/09/2024  3:36 AMLast dose:  -- Last Medication Note: >> HERSCHEL BUELL Repress Sep 09, 2024  2:18 AMMedHx Tech(Mounia Belica, CPHT): FLAG FOR REMOVAL -  Prescription was already completed as directed by patient's providerEntered by HERSCHEL BUELL, CPHT Sun Sep 09, 2024 9781       Discontinued: 09/09/2024  3:36 AMLast dose:  -- Last Medication Note: >> HERSCHEL BUELL Repress Sep 09, 2024  2:18 AMMedHx Tech(Mounia Belica, CPHT): FLAG FOR REMOVAL -  Prescription was already completed as directed by patient's providerEntered by HERSCHEL BUELL, CPHT Sun Sep 09, 2024 9781       Discontinued: 09/09/2024  3:36 AMLast dose: Not TakingLast Medication Note: >> HERSCHEL BUELL Repress Sep 09, 2024  2:16 AMMedHx Tech(Mounia Belica, CPHT): FLAG FOR REMOVAL -  Prescription was already completed as directed by patient's providerEntered by HERSCHEL BUELL, CPHT Sun Sep 09, 2024 0216   Yes   Discontinued: 09/09/2024  3:36 AMLast dose:  -- Last Medication Note: >> HERSCHEL BUELL Repress Sep 09, 2024  2:17 AMMedHx Tech(Mounia Belica, CPHT): FLAG FOR REMOVAL -  Prescription was already completed as directed by patient's providerEntered by HERSCHEL BUELL, CPHT Sun Sep 09, 2024 0217       calcium carbonate-vit D3-min 600 mg-10 mcg (400 unit) per tabletLast dose:  --  Take 1 tablet by mouth daily. Yes Yes   Discontinued: 09/09/2024  3:36 AMLast dose: Not TakingLast Medication Note: >> HERSCHEL BUELL Repress Sep 09, 2024  2:16 AMMedHx Tech(Mounia Belica, CPHT): FLAG FOR REMOVAL -  Prescription was already completed as directed by patient's providerEntered by HERSCHEL BUELL, CPHT Sun Sep 09, 2024 0216       cyclobenzaprine (FLEXERIL) 5 mg tabletLast dose:  --  Take 1 tablet (5 mg total) by mouth 3 (three) times a day. Yes Yes   diazePAM (VALIUM) 5 mg tabletLast dose:  --  Take 1 tablet (5 mg total) by mouth every 6 (six) hours as needed. Yes Yes   Discontinued: 09/09/2024  3:36 AMLast dose:  -- Last Medication Note: >> HERSCHEL BUELL Repress Sep 09, 2024  2:17 AMMedHx Tech(Mounia Belica, CPHT): FLAG FOR REMOVAL -  Prescription was already completed as directed by patient's providerEntered by HERSCHEL BUELL, CPHT Sun Sep 09, 2024 9782       Discontinued: 09/09/2024  3:36 AMLast dose:  -- Last Medication Note: >> HERSCHEL BUELL Repress Sep 09, 2024  2:17 AMMedHx Tech(Mounia Belica, CPHT): FLAG FOR REMOVAL -  Prescription was already completed as directed by patient's providerEntered by HERSCHEL BUELL, CPHT Sun Sep 09, 2024 0217       furosemide (LASIX) 20 mg tabletLast dose:  --  Take 1 tablet (20 mg total) by mouth daily. Yes Yes   omeprazole  (PRILOSEC) 40 mg capsuleLast dose:  --  Take 1 capsule (40 mg total) by mouth daily. Yes Yes   ondansetron  (ZOFRAN -ODT) 8 mg disintegrating tabletLast dose:  --  Take 1 tablet (8 mg total) by mouth every 8 (eight) hours as needed. Yes Yes   Discontinued: 09/09/2024  3:36 AMLast dose:  -- Last Medication Note: >> HERSCHEL BUELL Repress Sep 09, 2024  2:17 AMMedHx Tech(Mounia Belica, CPHT): FLAG FOR REMOVAL -  Prescription was already completed as directed by patient's providerEntered by HERSCHEL BUELL, CPHT Sun Sep 09, 2024 0217 oxyCODONE  (ROXICODONE ) 5 mg/5 mL solutionLast dose:  --  5 mLs (5 mg total) by Per G Tube route every 6 (six) hours as needed. Yes Yes   polyethylene glycol (MIRALAX) 17 gram packetLast dose:  --  Take 1 packet (17 g total) by mouth daily as needed for constipation. Yes Yes   pregabalin (LYRICA) 50 mg capsuleLast dose:  --  Take 1 capsule (50 mg total) by mouth 2 (two) times a day. Yes Yes   scopolamine (TRANSDERM-SCOP) 1 mg transdermal patchLast dose: 09/08/2024 Place 1 patch (1 mg total) onto the skin every third day. Yes Yes   senna-docusate (SENNA-PLUS) 8.6-50 mg tabletLast dose:  --  Take 2 tablets by mouth daily. Yes Yes   spironolactone  (ALDACTONE ) 50 mg tabletLast dose:  --  take 1 tablet (50 mg total) by mouth every morning before breakfast Yes Yes   sucralfate  (CARAFATE ) 100 mg/mL suspensionLast dose:  --  Take 10 mL (1 g total) by mouth 4 (four) times a day before meals and nightly. On an empty stomach. Yes Yes   THERAPEUTIC-M 9 mg iron-400 mcg per tabletLast dose:  --  Take 1 tablet by mouth daily. Yes Yes   Prior to admission medications last reviewed by Venora Cough, MD on Sun Sep 09, 2024 0336 Thank rosine Lacinda Acton, PharmD9/28/20259:15 AMPhone/Epic Secure Chat

## 2024-09-09 NOTE — Plan of Care
 Problem: Adult Inpatient Plan of CareGoal: Plan of Care ReviewOutcome: Interventions implemented as appropriateGoal: Patient-Specific Goal (Individualized)Outcome: Interventions implemented as appropriateGoal: Absence of Hospital-Acquired Illness or InjuryOutcome: Interventions implemented as appropriateGoal: Optimal Comfort and WellbeingOutcome: Interventions implemented as appropriateGoal: Readiness for Transition of CareOutcome: Interventions implemented as appropriate Problem: WoundGoal: Optimal CopingOutcome: Interventions implemented as appropriateGoal: Optimal Functional AbilityOutcome: Interventions implemented as appropriateGoal: Absence of Infection Signs and SymptomsOutcome: Interventions implemented as appropriateGoal: Improved Oral IntakeOutcome: Interventions implemented as appropriateGoal: Optimal Pain Control and FunctionOutcome: Interventions implemented as appropriateGoal: Skin Health and IntegrityOutcome: Interventions implemented as appropriateGoal: Optimal Wound HealingOutcome: Interventions implemented as appropriate Problem: Fall Injury RiskGoal: Absence of Fall and Fall-Related InjuryOutcome: Interventions implemented as appropriate Plan of Care Overview/ Patient Status0100Pt arrived via stretcher with attendant, transferred with sliding board to bed, able to move all extremities, was unable to walk due to weakness. Lethargic, arouse easily, a/ox4, answered all questions. IV fluid, peg tube noted intact, clamped. Nutrition consult noted.. Safety maintained.0600In bed sleeping. Safety maintained.

## 2024-09-10 LAB — IRON AND TIBC
BKR IRON SATURATION: 19 % (ref 15–50)
BKR IRON: 40 ug/dL (ref 37–145)
BKR TOTAL IRON BINDING CAPACITY: 209 ug/dL — ABNORMAL LOW (ref 250–450)

## 2024-09-10 LAB — BASIC METABOLIC PANEL
BKR ANION GAP: 6 — ABNORMAL LOW (ref 7–17)
BKR BLOOD UREA NITROGEN: 9 mg/dL (ref 6–20)
BKR BUN / CREAT RATIO: 15 (ref 8.0–23.0)
BKR CALCIUM: 8.8 mg/dL (ref 8.8–10.2)
BKR CHLORIDE: 106 mmol/L (ref 98–107)
BKR CO2: 25 mmol/L (ref 20–30)
BKR CREATININE DELTA: 0
BKR CREATININE: 0.6 mg/dL (ref 0.40–1.30)
BKR EGFR, CREATININE (CKD-EPI 2021): >60 mL/min/1.73m2 (ref >=60)
BKR GLUCOSE: 69 mg/dL — ABNORMAL LOW (ref 70–100)
BKR POTASSIUM: 3.6 mmol/L (ref 3.3–5.3)
BKR SODIUM: 137 mmol/L (ref 136–144)

## 2024-09-10 LAB — CBC WITH AUTO DIFFERENTIAL
BKR WAM ABSOLUTE IMMATURE GRANULOCYTES.: 0.01 x 1000/ÂµL (ref 0.00–0.30)
BKR WAM ABSOLUTE LYMPHOCYTE COUNT.: 1.87 x 1000/ÂµL (ref 0.60–3.70)
BKR WAM ABSOLUTE NRBC: 0 x 1000/ÂµL (ref 0.00–1.00)
BKR WAM ANC (ABSOLUTE NEUTROPHIL COUNT): 1.56 x 1000/ÂµL — ABNORMAL LOW (ref 2.00–7.60)
BKR WAM BASOPHIL ABSOLUTE COUNT.: 0.03 x 1000/ÂµL (ref 0.00–1.00)
BKR WAM BASOPHILS: 0.7 % (ref 0.0–1.4)
BKR WAM EOSINOPHIL ABSOLUTE COUNT.: 0.17 x 1000/ÂµL (ref 0.00–1.00)
BKR WAM EOSINOPHILS: 4.1 % (ref 0.0–5.0)
BKR WAM HEMATOCRIT: 28.6 % — ABNORMAL LOW (ref 35.00–45.00)
BKR WAM HEMOGLOBIN: 9.2 g/dL — ABNORMAL LOW (ref 11.7–15.5)
BKR WAM IMMATURE GRANULOCYTES: 0.2 % (ref 0.0–1.0)
BKR WAM LYMPHOCYTES: 45.1 % (ref 17.0–50.0)
BKR WAM MCH: 32.9 pg (ref 27.0–33.0)
BKR WAM MCHC: 32.2 g/dL (ref 31.0–36.0)
BKR WAM MCV: 102.1 fL — ABNORMAL HIGH (ref 80.0–100.0)
BKR WAM MONOCYTE ABSOLUTE COUNT.: 0.51 x 1000/ÂµL (ref 0.00–1.00)
BKR WAM MONOCYTES: 12.3 % — ABNORMAL HIGH (ref 4.0–12.0)
BKR WAM MPV: 9 fL (ref 8.0–12.0)
BKR WAM NEUTROPHILS: 37.6 % — ABNORMAL LOW (ref 39.0–72.0)
BKR WAM NUCLEATED RED BLOOD CELLS: 0 % (ref 0.0–1.0)
BKR WAM PLATELETS: 424 x1000/ÂµL — ABNORMAL HIGH (ref 150–420)
BKR WAM RDW-CV: 14.7 % (ref 11.0–15.0)
BKR WAM RED BLOOD CELL COUNT.: 2.8 M/ÂµL — ABNORMAL LOW (ref 4.00–6.00)
BKR WAM WHITE BLOOD CELL COUNT: 4.2 x1000/ÂµL (ref 4.0–11.0)

## 2024-09-10 LAB — HEPATIC FUNCTION PANEL
BKR A/G RATIO: 0.9 — ABNORMAL LOW (ref 1.0–2.2)
BKR ALANINE AMINOTRANSFERASE (ALT): 20 U/L (ref 10–35)
BKR ALBUMIN: 2.6 g/dL — ABNORMAL LOW (ref 3.6–5.1)
BKR ALKALINE PHOSPHATASE: 82 U/L (ref 9–122)
BKR ASPARTATE AMINOTRANSFERASE (AST): 29 U/L (ref 10–35)
BKR AST/ALT RATIO: 1.5
BKR BILIRUBIN DIRECT: 0.2 mg/dL (ref <=0.2)
BKR BILIRUBIN TOTAL: 0.4 mg/dL (ref <=1.2)
BKR GLOBULIN: 2.9 g/dL (ref 2.0–3.9)
BKR PROTEIN TOTAL: 5.5 g/dL — ABNORMAL LOW (ref 5.9–8.3)

## 2024-09-10 LAB — MAGNESIUM: BKR MAGNESIUM: 1.9 mg/dL (ref 1.7–2.4)

## 2024-09-10 LAB — FERRITIN: BKR FERRITIN: 74 ng/mL (ref 13–150)

## 2024-09-10 LAB — RETICULOCYTES
BKR WAM IRF: 17.8 % — ABNORMAL HIGH (ref 3.0–15.9)
BKR WAM RETICULOCYTE - ABS (3 DEC): 0.15 10Ë6 cells/uL — ABNORMAL HIGH (ref 0.023–0.140)
BKR WAM RETICULOCYTE COUNT PCT (1 DEC): 5.4 % — ABNORMAL HIGH (ref 0.6–2.7)
BKR WAM RETICULOCYTE HGB EQUIVALENT: 35.6 pg (ref 28.2–35.7)

## 2024-09-10 LAB — MD SMEAR INTERPRETATION (BH GH LMW YH)

## 2024-09-10 LAB — HEMOGLOBIN A1C
BKR ESTIMATED AVERAGE GLUCOSE: 51 mg/dL
BKR HEMOGLOBIN A1C: 3.4 % — ABNORMAL LOW (ref 4.0–5.6)

## 2024-09-10 LAB — PHOSPHORUS     (BH GH L LMW YH): BKR PHOSPHORUS: 4.7 mg/dL — ABNORMAL HIGH (ref 2.2–4.5)

## 2024-09-10 MED ORDER — ONDANSETRON 4 MG DISINTEGRATING TABLET
4 | Freq: Three times a day (TID) | GASTROSTOMY | Status: DC | PRN
Start: 2024-09-10 — End: 2024-09-16
  Administered 2024-09-10: 20:00:00 4 mg via GASTROSTOMY

## 2024-09-10 MED ORDER — PANTOPRAZOLE IV PUSH 40 MG VIAL & NS (ADULTS)
Freq: Two times a day (BID) | INTRAVENOUS | Status: DC
Start: 2024-09-10 — End: 2024-09-12
  Administered 2024-09-10 – 2024-09-12 (×4): 10.000 mL via INTRAVENOUS

## 2024-09-10 MED ORDER — PREGABALIN 50 MG CAPSULE
50 | Freq: Two times a day (BID) | GASTROSTOMY | Status: DC
Start: 2024-09-10 — End: 2024-09-16
  Administered 2024-09-10 – 2024-09-16 (×12): 50 mg via GASTROSTOMY

## 2024-09-10 MED ORDER — DIAZEPAM 2 MG TABLET
2 | Freq: Four times a day (QID) | GASTROSTOMY | Status: DC | PRN
Start: 2024-09-10 — End: 2024-09-16
  Administered 2024-09-10 – 2024-09-16 (×14): 2 mg via GASTROSTOMY

## 2024-09-10 MED ORDER — MULTIVITAMIN-IRON 9 MG-FOLIC ACID 400 MCG-CALCIUM AND MINERALS TABLET
9 | GASTROSTOMY | Status: DC
Start: 2024-09-10 — End: 2024-09-16
  Administered 2024-09-11 – 2024-09-16 (×6): 9 mg iron-400 mcg via GASTROSTOMY

## 2024-09-10 MED ORDER — POLYETHYLENE GLYCOL 3350 17 GRAM ORAL POWDER PACKET
17 | Freq: Every day | GASTROSTOMY | Status: DC | PRN
Start: 2024-09-10 — End: 2024-09-12
  Administered 2024-09-11: 21:00:00 17 g via GASTROSTOMY

## 2024-09-10 MED ORDER — POTASSIUM CHLORIDE ER 20 MEQ TABLET,EXTENDED RELEASE(PART/CRYST)
20 | Freq: Once | ORAL | Status: CP
Start: 2024-09-10 — End: ?
  Administered 2024-09-10: 09:00:00 20 meq via ORAL

## 2024-09-10 NOTE — Plan of Care
 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 CM met patient at bedside, introduced self and role in care management. Patient lives alone in an apt in [place]. No DMEs/HCS. Independent at baseline, DC needs to be determined. Friend transports. Case Management/Social Work Geophysicist/field seismologist Most Recent Value Case Management/Social Work Screening: Chart review completed. If YES to any question below then proceed to Eval/Plan  Is there a change in their cognitive function No Do you anticipate that the patient will have any discharge needs requiring CM/SW intervention? Yes Has there been an unscheduled readmission within the past 30 days and/or >four (4) admissions within 12 months? No Were there services prior to admission ( Examples: Acute [facility],  Assisted Living, HD, Homecare, Extended Care Facility, Methadone, SNF, Outpatient Infusion Center) No Concern for current abuse/neglect/interpersonal violence/sexual assault  No Connected to state agency? No Guardianship or conservatorship No Negative/Positive Screen Positive Screening: Complete CM/SW Evaluation and Plan Case Manager/Social Work Patent attorney  I have reviewed the medical record and completed the above screen. CM/SW staff will follow patient's progress and discuss the plan of care with the Treatment Team. Yes Case Management/Social Work Evaluation and Plan  Arrived from prior to admission home/apartment/condo Lives with Alone Services Prior to Admission None Patient Requires Transition of Care Intervention Due To Discharge planning needs/concerns Prior to Hospitalization: Assistance Needed/DME being used None Documented Insurance Accurate Yes Any financial concerns related to anticipated discharge needs No Patient's home address verified Yes Patient's PCP of record verified Yes Last Date Seen by PCP 0-3 months Source of Clinical History  Patient's clinical history has been reviewed and source of Information is: Patient, Civil Service fast streamer, Medical Record Discharge Planning Coordination Recommendations  Discharge Planning Coordination Recommendations Home with Homecare Services Case Manager/Social Worker reviewed plan of care/ continuum of care need's with  Patient, Interdisciplinary Team, Discharge Planning Rounds Housing / Transportation/ Environment  What is your living situation today? Patient declined Think about the place you live. Do you have problems with any of the following? None In the past 12 months has the electric, gas, oil, or water company threatened to shut off services in your home? No Within the past 12 months, you worried that your food would run out before you got the money to buy more. Never true Within the past 12 months, the food you bought just didn't last and you didn't have money to get more. Never true In the past 12 months, has lack of transportation kept you from medical appointments or from getting medications? no In the past 12 months, has lack of transportation kept you from meetings, work, or from getting things needed for daily living? No  Abuse Screen  Is there anyone in your life that is hurting or threatening you in anyway? no Read to patient We know that many of our patients and caregivers are exposed to violence in the home so we have started letting everyone know that [place]  has a safe, confidential and free 24/7 hotline called [place] Safe Connect no education not provided Would it be ok if we add this resource to your DC paperwork? no Abuse Screen (yes response referral indicated)  Able to respond  to abuse questions Yes Is there anyone in your life that is hurting or threatening you in anyway? no Physical Indicators of Abuse No evidence of physical abuse Read to patient We know that many of our patients and caregivers are exposed to violence in the home so we have started letting everyone know that [place]  has a safe, confidential and free 24/7 hotline called  Safe Connect no education not provided Would it be ok if we add this resource to your DC paperwork? no Erin Humble RN BSNCare ManagerYale Parmer Medical Center HospitalMHB [phone]

## 2024-09-10 NOTE — Plan of Care
 Gina Aguilar					Location: VE3S/S331-28 y.o., female				Attending: Gerry Norris, MD	Admit Date: 09/08/2024			[id number] LOS: 1 day Initial Nutrition AssessmentReason For Assessment: per organizational policy, nurse/nurse practitioner consult, physician consultIdentified At Risk by Screening Criteria: tube feeding or parenteral nutritionNew Recommendations:Nutrition Prescription: Continue puree diet as toleratedRecommend Osmolite 1.5 @ 50 ml/hr x 24 hours This provides 1800 kcal, 75 gm protein, and 914 ml free H20 in 1200 ml total volume per day and meets 97% of estimated nutrient needs Initiate at 10 ml/hr; increase by 10 ml q 6 hrs or as tolerated until goal of 50 ml/hr x 24 hours is reached Elevate HOB, maintain 30-45 degrees for aspiration precautionsContinue to monitor for signs of intolerance; abdominal distention, residuals >547mL, N/V/D Recommend minimum 30mL free water flushes Q4 to maintain tube patency, or free water flushes per medical team discretion Calorie count ordered to see how much food pt is taking PO-- will follow-up with results 10/2Reason for assessment: RN and MD consult for tube feedingHPI: Per MD note 9/28 28 y.o. female PMHx obesity s/p Roux-en-Y w/ multiple complications including perforation of marginal ulcer iso nonadherence w/ PPI w/ Arlyss patch, stricture requiring several EGD dilatations, multiple hospitalizations for related complaints including chronic abd pain, n/v most recently 9/9-23 iso poor nutritional status, s/p PEG for TFs but tolerating PO intermittently iso n/v, abd pain p/w n/v, abd pain, lethargyTF -previously dc'd tolerating 4-5 boluses Jevity up to 240 cc each per dayPer ED note 9/27 G-tube placed 2 months ago, reporting difficulty with feedings through g-tube, last seen by Deitra Jenkins RD 9/23/25Subjective: Initial nutrition assessment being done for RN and MD consult for tube feeding. No PO intake recorded in flowsheet since admit. No pressure injuries noted per flowsheet. Per hosp d/c summary 9/23 weight loss of 30% UBW in 6 months, severe muscle wasting (temporalis, quadriceps, pectoralis, trapezius), po intake <75% of needs > 1 month. Per chart review pt with difficulty pt with pain and nausea during bolus feeds during last admission, will recommend continuous feeds for now. TF recommendations stated above and communicated with covering provider MD BekuiCurrent Diet Order: Diet PureedCurrent Intake: No PO intake recorded in flowsheet since admit I/Os: LBM 9/27/25Intake/Output Summary (Last 24 hours) at 09/10/2024 0921Last data filed at 09/10/2024 0131Gross per 24 hour Intake 3059.94 ml Output 400 ml Net 2659.94 ml  Diet History: Start   Ordered 09/09/24 1719  Diet Pureed  DIET EFFECTIVE NOW       09/09/24 1719 09/09/24 1230  Diet Clear Liquid  DIET EFFECTIVE NOW,   Status:  Canceled       09/09/24 1229 09/09/24 0910  Diet NPO Effective Now  DIET EFFECTIVE NOW,   Status:  Canceled       09/09/24 0909 09/09/24 0842  Diet NPO Effective Now  DIET EFFECTIVE NOW,   Status:  Canceled       Comments:  Ok for PO sucralfate   09/09/24 0842 09/09/24 0324  Diet NPO Effective Now  DIET EFFECTIVE NOW,   Status:  Canceled       09/09/24 0323 09/09/24 0315  Diet Tube Feed With Tray  DIET EFFECTIVE NOW,   Status:  Canceled       Comments:  Important:  Unrestricted diet ...  09/09/24 0315  Food Allergies: Allergies[1]Cultural/Religious/Ethnic Needs: NoSkin: No pressure injuries noted per flowsheetBraden Score: : lasix (held), multivitamin with folic acid, pantoprazole , spironolactone  (held), sucralfate , miralax prnLabs: Recent Labs   09/27/252328 09/28/250306 09/28/252000 09/29/250621 NA 140  --  141 137 K 4.1  --  3.5  3.6 PHOS 4.0   < > 3.8 4.7* MG 2.0   < > 2.0 1.9 CREATININE 0.60  --  0.60 0.60 BUN 15  --  12 9 < > = values in this interval not displayed. POCT BG range over the past 24 hours: 69-87Lytes phos 4.7 highAnthropometrics:- Height: 5' 6 (1.676 m)- Current weight: 61.7 kg- BMI: Body mass index is 21.95 kg/m?SABRAWeight History:Wt Readings from Last 20 Encounters: 09/09/24 61.7 kg 07/21/23 134.4 kg 06/08/23 (!) 145 kg 08/30/22 (!) 186.4 kg 02/08/22 (!) 184.2 kg 08/10/21 (!) 182.8 kg 12/30/20 (!) 191.3 kg 09/30/20 (!) 196 kg 09/24/20 (!) (P) 196.2 kg 09/15/20 (!) 200 kg 07/24/20 (!) 195.3 kg 06/17/20 (!) 194.7 kg 05/13/20 (!) 194.2 kg 03/28/20 (!) 192 kg 02/25/20 (!) 195 kg 12/03/19 (!) 191.9 kg 09/04/19 (!) 191.7 kg 04/26/19 (!) 193.7 kg 04/12/19 (!) 193.7 kg 11/30/18 (!) 194 kg Physical findings:- Muscle Loss:		Temple: Severe	Clavicle: Severe	Deltoid: Severe- Edema: none noted ESTIMATED NUTRITION REQUIREMENTS:Kcal/day: 1851-2160 kcal		(30-35 kcal/kg)Protein/day: 74-93 gm		(1.2-1.5 gm/kg)Fluids/day: Fluid management per medical team discretion. 1851-2160 ml (28mL/kcal)Needs based on: Current weight 61.7 kg, age, weight status, severe malnutritionNutrition Diagnosis:	Nutrition Diagnosis/Problem: Severe Malnutrition	Related to: chronic factors	As Evidenced by: Severe muscle wasting, PO intake <75% >1 monthComments: NewNew Recommendations:Nutrition Prescription: Continue puree diet as toleratedRecommend Osmolite 1.5 @ 50 ml/hr x 24 hours This provides 1800 kcal, 75 gm protein, and 914 ml free H20 in 1200 ml total volume per day and meets 97% of estimated nutrient needs Initiate at 10 ml/hr; increase by 10 ml q 6 hrs or as tolerated until goal of 50 ml/hr x 24 hours is reached Elevate HOB, maintain 30-45 degrees for aspiration precautionsContinue to monitor for signs of intolerance; abdominal distention, residuals >538mL, N/V/D Recommend minimum 30mL free water flushes Q4 to maintain tube patency, or free water flushes per medical team discretion Calorie count ordered to see how much food pt is taking PO-- will follow-up with results 10/2Plan: 	Meal and Snacks: General/Healthful diet	Enteral and Parenteral : Enteral formula/solution, Feeding tube flush	Vitamin and Mineral Supplements: Multivitamin/mineral, Multi-trace elements	Nutrition-related medication management: Medications (per MD)	D/C & transfer of care to new setting or provider: Collaborative referal to other providers (D/C plans unknown at this time. RD to follow while inpatient) Goals: Patient will consume >50% of all meals prior to next nutrition assessment  Patient will advance/tolerate TF at goal prior to next nutrition assessment  Patient will receive at least 90% of enteral volume goal prior to next nutrition assessment  Monitoring/Evaluation:Food/Nutrition-Related Outcomes:            food and nutrient intake/administration.Anthropometric Outcomes:            Height, weight, BMI, Weight historyBiochemical Data, Medical Tests, and Procedure Outcomes:            Labs (BMP).Nutrition-Focused Physical Finding Outcomes:            Physical appearance, muscle and fat wasting, swallow function, appetite.RDN following per [facility] Clinical Nutrition Standards of Care.Registered Dietitian Nutritionist: Electronically signed by Thersia Reef, MS, RD, CD-N 9/29/2025Please contact via Epic Secure Chat [phone] with any questions/concernsFor prosource/banatrol please call [phone] note: Nutrition is a consult only service on weekends and holidays. Please enter a consult in EPIC if assistance is needed on a weekend or holiday or reach out to [facility] Food and Nutrition Services Adult Dietitian (Weekend) [phone] on Epic Secure Chat to contact the covering RD.  [1] AllergiesAllergen Reactions  Lidocaine  Hives and Itching

## 2024-09-10 NOTE — Progress Notes
 [facility]	Medicine Progress NoteTreatment Team:  HospitalistAttending Provider:  Gerry Norris, MD PCP: Debrah Houston TSubjective: Patient seen, Chart reviewedOvernight events notedPatient expressing frustration at all the complications she had after her surgerySays she wants to see the Bariatric Surgery team at [facility] asking for a regular diet Review of Allergies/Meds/Hx: I have reviewed the patient's: Allergies/PMH/Overnight events/MedsObjective: Vitals:Temp:  [97.9 ?F (36.6 ?C)-98.7 ?F (37.1 ?C)] 98.3 ?F (36.8 ?C)Pulse:  [73-97] 86Resp:  [16-19] 19BP: (97-114)/(64-75) 103/67SpO2:  [100 %] 100 %Device (Oxygen Therapy): room airI/O's: Gross Totals (Last 24 hours) at 09/10/2024 1351Last data filed at 09/10/2024 1200Intake 3059.94 ml Output 100 ml Net 2959.94 ml Physical Exam: GENERAL/Constitutional: Young woman, not in acute distressHEENT: Pallor+RESPIRATORY: No SOB, Breath sounds vesicular CVS: S1 and S2 heard and normalGI: abdomen soft and non-tender, No hepatosplenomegaly, G-tube in placeMUSCULOSKELETAL:   no edemaNEURO: Awake, alert and oriented to person and place. Motor power -moves all extremitiesSKIN: No Rashes/Lesions.PSYCH: anxiousLabs: Prothrombin Time Date Value Ref Range Status 05/04/2016 11.1 9.8 - 11.8 seconds Final PTT Date Value Ref Range Status 05/04/2016 25.8 23.9 - 31.9 seconds Final INR Date Value Ref Range Status 05/04/2016 1.03 0.90 - 1.10 Final Complete Blood CountResults in Past 7 DaysResult Component Current Result Hematocrit 28.60 (L) (09/10/2024) Hemoglobin 9.2 (L) (09/10/2024) MCH 32.9 (09/10/2024) MCHC 32.2 (09/10/2024) MCV 102.1 (H) (09/10/2024) MPV 9.0 (09/10/2024) Platelets 424 (H) (09/10/2024) RBC 2.80 (L) (09/10/2024) WBC 4.2 (09/10/2024) lBasic Metabolic Panel:Lab Results Component Value Date  GLU 69 (L) 09/10/2024  BUN 9 09/10/2024  CREATININE 0.60 09/10/2024  CO2 25 09/10/2024  CL 106 09/10/2024  NA 137 09/10/2024  K 3.6 09/10/2024  CALCIUM 8.8 09/10/2024 Diagnostics:ECG/Tele Events: Assessment And Plan 28 year old female with  PMH of  obesity s/p Roux-en-Y with  complicated course including perforation of marginal ulcer reportedly from  nonadherence with PPI with Arlyss patch, stricture requiring several EGD dilatations, multiple hospitalizations for related complaints including chronic abd pain, N&V most recently 9/9-23 and poor nutritional status, s/p PEG for TFs but tolerating PO intermittently due to nausea and vomiting now admitted for abd pain, nausea and vomiting and  lethargy Lethargy on presentation-unclear etiology-Toxicology screen was not sentChronic abd pain/Nausea and vomiting/Hx of non-cirrhotic portal HTNAppears to be better today-monitor for nowContinue Zofran  prnConsider GI consult as IP if continues to be symptomaticHas been on Oxycodone  in the past for short periods of timeContinue Oxycodone , Lyrica for nowContinue BID PPI and CarafateContinue to hold diuretics for nowSevere malnutrition after Roux- en Y with complicated courseContinue TF as per Nutrition and regular diet as toleratedTo have calorie counts as per NutritionPatient can be given an ambulatory referral to [facility] labs in amDVT prophylaxis-SC LovenoxDiet-TF with TrayNot medically ready for DC today50 minutes spent on direct Patient Care and chart reviewI have reviewed the patient's problem list and updated it as needed.Electronically Signed:Quintavious Rinck, Norris, MDContact info: MHB9/29/20251:51 PM

## 2024-09-10 NOTE — Plan of Care
 Problem: Adult Inpatient Plan of CareGoal: Plan of Care ReviewOutcome: Interventions implemented as appropriate Plan of Care Overview/ Patient StatusPatient alert/ oriented x 4, calm and cooperative with care.  POC, meds, tx reviewed- patient states understanding of teaching.  Independent in room.  Abdomen tender at PEG tube site.  Site cleaned and dressing applied.  Tube feeding started at 2pm.  Osmolite infusing at 10mL/Hr.  Tolerating regular diet in small amounts.  Voiding in bathroom.  Afebrile, VSS. Safety maintained at all times, plan of care ongoing.

## 2024-09-10 NOTE — Plan of Care
 Problem: Adult Inpatient Plan of CareGoal: Plan of Care ReviewOutcome: Interventions implemented as appropriate Plan of Care Overview/ Patient Status9/29/2025 6:37 AM Patient is alert and oriented x4. Afebrile. VSS on RA. On continuous pulse ox. Complained of abdominal pain. Oxycodone  and tylenol  given with effect. OOB independently with standby assist. On Pureed diet. Takes med crushed via PEG tube. Call bell within reach. Standard precaution maintained.

## 2024-09-10 NOTE — Other
 Malnutrition IdentificationNutrition DiagnosisNutrition Diagnosis/Problem: Severe MalnutritionRelated to: chronic factorsAs Evidenced by: Severe muscle wasting, PO intake <75% >1 monthMalnutrition Diagnosis - Adult: Severe MalnutritionClinician co-signature indicates agreement with the RD diagnosis.Registered dietitian has identified this patient as severely malnourished based on the following criteria:Severe Malnutrition:Energy Intake:  Chronic Illness: Less than or equal to 75% for greater than or equal to 1 month Muscle Mass:   Chronic Illness: Severe Depletion - [employer], Severe Depletion - Clavicle bone region (trapezius, supraspinatus, infraspinatus muscles), Severe Depletion - Shoulder and acromium process region (deltoid Muscle)  See full dietitian note 9/29Electronically signed by Thersia Reef, MS, RD, CD-N 9/29/2025Please contact via Epic Secure Chat [phone] with any questions/concernsFor prosource/banatrol please call [phone] note: Nutrition is a consult only service on weekends and holidays. Please enter a consult in EPIC if assistance is needed on a weekend or holiday or reach out to [facility] Food and Nutrition Services Adult Dietitian (Weekend) [phone] on Epic Secure Chat to contact the covering RD.

## 2024-09-11 MED ORDER — SPIRONOLACTONE 25 MG TABLET
25 | Freq: Every day | ORAL | Status: DC
Start: 2024-09-11 — End: 2024-09-12
  Administered 2024-09-11 – 2024-09-12 (×2): 25 mg via ORAL

## 2024-09-11 MED ORDER — ONDANSETRON HCL (PF) 4 MG/2 ML INJECTION SOLUTION
4 | Freq: Three times a day (TID) | INTRAVENOUS | Status: DC | PRN
Start: 2024-09-11 — End: 2024-09-16
  Administered 2024-09-11 – 2024-09-15 (×4): 4 mL via INTRAVENOUS

## 2024-09-11 MED ORDER — METOCLOPRAMIDE 5 MG/ML INJECTION SOLUTION
5 | Freq: Once | INTRAVENOUS | Status: CP
Start: 2024-09-11 — End: ?
  Administered 2024-09-11: 22:00:00 5 mL via INTRAVENOUS

## 2024-09-11 MED ORDER — POTASSIUM CHLORIDE ER 20 MEQ TABLET,EXTENDED RELEASE(PART/CRYST)
20 | Freq: Once | ORAL | Status: CP
Start: 2024-09-11 — End: ?
  Administered 2024-09-11: 15:00:00 20 meq via ORAL

## 2024-09-11 MED ORDER — FUROSEMIDE 20 MG TABLET
20 | Freq: Every day | ORAL | Status: DC
Start: 2024-09-11 — End: 2024-09-12
  Administered 2024-09-11 – 2024-09-12 (×2): 20 mg via ORAL

## 2024-09-11 NOTE — Progress Notes
 [facility]	Medicine Progress NoteTreatment Team:  HospitalistAttending Provider:  Gerry Norris, MD PCP: Debrah Houston TSubjective: Patient seen, Chart reviewedOvernight events notedFeels a bit better today but still has ongoing abdominal painShe has been unable to see Pain management because of frequent hospitalizations Review of Allergies/Meds/Hx: I have reviewed the patient's: Allergies/PMH/Overnight events/MedsObjective: Vitals:Temp:  [97.6 ?F (36.4 ?C)-98.8 ?F (37.1 ?C)] 98.2 ?F (36.8 ?C)Pulse:  [60-86] 71Resp:  [16-18] 16BP: (113-118)/(72-88) 114/76SpO2:  [96 %-100 %] 100 %Device (Oxygen Therapy): room airI/O's: Gross Totals (Last 24 hours) at 09/11/2024 1416Last data filed at 09/11/2024 1353Intake 694 ml Output 600 ml Net 94 ml Physical Exam: GENERAL/Constitutional: Young woman, not in acute distressHEENT: Pallor+RESPIRATORY: No SOB, Breath sounds vesicular CVS: S1 and S2 heard and normalGI: abdomen soft and non-tender, No hepatosplenomegaly, G-tube in place with no evidence for infection at the siteMUSCULOSKELETAL:   no edemaNEURO: Awake, alert and oriented to person and place. Motor power -moves all extremitiesSKIN: No Rashes/Lesions.PSYCH: anxiousLabs: Prothrombin Time Date Value Ref Range Status 05/04/2016 11.1 9.8 - 11.8 seconds Final PTT Date Value Ref Range Status 05/04/2016 25.8 23.9 - 31.9 seconds Final INR Date Value Ref Range Status 05/04/2016 1.03 0.90 - 1.10 Final Complete Blood CountResults in Past 7 DaysResult Component Current Result Hematocrit 28.60 (L) (09/10/2024) Hemoglobin 9.2 (L) (09/10/2024) MCH 32.9 (09/10/2024) MCHC 32.2 (09/10/2024) MCV 102.1 (H) (09/10/2024) MPV 9.0 (09/10/2024) Platelets 424 (H) (09/10/2024) RBC 2.80 (L) (09/10/2024) WBC 4.2 (09/10/2024) lBasic Metabolic Panel:Lab Results Component Value Date  GLU 69 (L) 09/10/2024  BUN 9 09/10/2024  CREATININE 0.60 09/10/2024  CO2 25 09/10/2024  CL 106 09/10/2024  NA 137 09/10/2024  K 3.6 09/10/2024  CALCIUM 8.8 09/10/2024 Diagnostics:ECG/Tele Events: Assessment And Plan 28 year old female with  PMH of  obesity s/p Roux-en-Y with  complicated course including perforation of marginal ulcer reportedly from  nonadherence with PPI with Arlyss patch, stricture requiring several EGD dilatations, multiple hospitalizations for related complaints including chronic abd pain, N&V most recently 9/9-23 and poor nutritional status, s/p PEG for TFs but tolerating PO intermittently due to nausea and vomiting now admitted for abd pain, nausea and vomiting and  lethargy Lethargy on presentation-unclear etiology-Toxicology screen was not sentChronic abd pain/Nausea and vomiting/Hx of non-cirrhotic portal HTNAppears to be better today-monitor for nowContinue Zofran  prnConsider GI consult  if she is symptomaticHas been on Oxycodone  in the past for short periods of timeContinue Oxycodone , Lyrica for nowContinue BID PPI and CarafateResume diuretics for nowSevere malnutrition after Roux- en Y with complicated courseContinue TF as per Nutrition and regular diet as toleratedOn calorie counts as per NutritionPatient can be given an ambulatory referral to Bariatric surgery at DCBorderline low K-will give KCl today 40 meqRepeat labs in amDVT prophylaxis-SC LovenoxDiet-TF with TrayNot medically ready for DC todayEncourage ambulation as toleratedI have reviewed the patient's problem list and updated it as needed.Electronically Signed:Kersten Salmons, Norris, MDContact info: MHB9/30/20251:51 PM

## 2024-09-11 NOTE — Plan of Care
 Problem: Adult Inpatient Plan of CareGoal: Plan of Care ReviewOutcome: Interventions implemented as appropriate Plan of Care Overview/ Patient Status9/30/2025 5:47 AM Patient is alert and oriented x4. VSS on RA. Lungs CTA, Afebrile. Continuous pulse ox monitoring. Complained of body pain, Valium given with good effect. Zofran  IVP given for nausea. PEG tube on mid abdomen. Osmolite tube feed running @ 36ml/hr to increase by 10 Q6H till 50. OOB independently with IV pole. Call bell and personal item within reach. Safety precautions maintained.

## 2024-09-11 NOTE — Plan of Care
 Problem: Pain AcuteGoal: Optimal Pain Control and FunctionOutcome: Interventions implemented as appropriate Problem: Nausea and VomitingGoal: Nausea and Vomiting ReliefOutcome: Interventions implemented as appropriate Plan of Care Overview/ Patient StatusA&O x4, RA no acute respiratory distress noted, Continuous pulse ox, calorie count, independent continent x2,  Peg tube feed, Osmolite 40ml titrate q6 x 10 to goal 50, dressing CDI, medication administered per mar, bed alarm refused, bed in low position, call bell within reach, safety maintained, will continue with plan of care.

## 2024-09-12 LAB — BASIC METABOLIC PANEL
BKR ANION GAP: 10 (ref 7–17)
BKR BLOOD UREA NITROGEN: 17 mg/dL (ref 6–20)
BKR BUN / CREAT RATIO: 34 — ABNORMAL HIGH (ref 8.0–23.0)
BKR CALCIUM: 8.4 mg/dL — ABNORMAL LOW (ref 8.8–10.2)
BKR CHLORIDE: 105 mmol/L (ref 98–107)
BKR CO2: 22 mmol/L (ref 20–30)
BKR CREATININE DELTA: -0.1
BKR CREATININE: 0.5 mg/dL (ref 0.40–1.30)
BKR EGFR, CREATININE (CKD-EPI 2021): >60 mL/min/1.73m2 (ref >=60)
BKR GLUCOSE: 85 mg/dL (ref 70–100)
BKR POTASSIUM: 4.7 mmol/L (ref 3.3–5.3)
BKR SODIUM: 137 mmol/L (ref 136–144)

## 2024-09-12 LAB — METHYLMALONIC ACID: BKR METHYLMALONIC ACID: 0.09 umol/L (ref 0.00–0.40)

## 2024-09-12 MED ORDER — HYDROXYZINE HCL 10 MG TABLET
10 | Freq: Three times a day (TID) | ORAL | Status: DC | PRN
Start: 2024-09-12 — End: 2024-09-16
  Administered 2024-09-14 – 2024-09-16 (×4): 10 mg via ORAL

## 2024-09-12 MED ORDER — BISACODYL 10 MG RECTAL SUPPOSITORY
10 | Freq: Every day | RECTAL | Status: DC | PRN
Start: 2024-09-12 — End: 2024-09-16
  Administered 2024-09-12 – 2024-09-14 (×2): 10 mg via RECTAL

## 2024-09-12 MED ORDER — FUROSEMIDE 20 MG TABLET
20 | Freq: Every day | GASTROSTOMY | Status: DC
Start: 2024-09-12 — End: 2024-09-12

## 2024-09-12 MED ORDER — ACETAMINOPHEN 325 MG TABLET
325 | Freq: Four times a day (QID) | ORAL | Status: DC | PRN
Start: 2024-09-12 — End: 2024-09-12

## 2024-09-12 MED ORDER — POLYETHYLENE GLYCOL 3350 17 GRAM ORAL POWDER PACKET
17 | Freq: Two times a day (BID) | GASTROSTOMY | Status: DC
Start: 2024-09-12 — End: 2024-09-16
  Administered 2024-09-12 – 2024-09-13 (×3): 17 g via GASTROSTOMY

## 2024-09-12 MED ORDER — SPIRONOLACTONE 25 MG TABLET
25 | Freq: Every day | GASTROSTOMY | Status: DC
Start: 2024-09-12 — End: 2024-09-12

## 2024-09-12 MED ORDER — SODIUM CHLORIDE 0.9 % IV BOLUS NEW BAG (DROPS CHARGE)
0.9 | Freq: Once | INTRAVENOUS | Status: DC
Start: 2024-09-12 — End: 2024-09-16

## 2024-09-12 MED ORDER — METOCLOPRAMIDE 5 MG/ML INJECTION SOLUTION
5 | Freq: Once | INTRAVENOUS | Status: DC
Start: 2024-09-12 — End: 2024-09-16

## 2024-09-12 MED ORDER — FUROSEMIDE 20 MG TABLET
20 | Freq: Every day | GASTROSTOMY | Status: DC
Start: 2024-09-12 — End: 2024-09-16
  Administered 2024-09-13 – 2024-09-15 (×3): 20 mg via GASTROSTOMY

## 2024-09-12 MED ORDER — LANSOPRAZOLE 3 MG/ML ORAL SUSPENSION
3 | Freq: Every day | GASTROSTOMY | Status: DC
Start: 2024-09-12 — End: 2024-09-16
  Administered 2024-09-13 – 2024-09-16 (×4): 3 mL via GASTROSTOMY

## 2024-09-12 MED ORDER — ACETAMINOPHEN 325 MG TABLET
325 | Freq: Four times a day (QID) | GASTROSTOMY | Status: DC | PRN
Start: 2024-09-12 — End: 2024-09-14
  Administered 2024-09-12 – 2024-09-14 (×4): 325 mg via GASTROSTOMY

## 2024-09-12 MED ORDER — KETOROLAC 15 MG/ML INJECTION SOLUTION
15 | Freq: Once | INTRAVENOUS | Status: DC
Start: 2024-09-12 — End: 2024-09-14

## 2024-09-12 MED ORDER — SPIRONOLACTONE 25 MG TABLET
25 | Freq: Every day | GASTROSTOMY | Status: DC
Start: 2024-09-12 — End: 2024-09-16
  Administered 2024-09-13 – 2024-09-16 (×4): 25 mg via GASTROSTOMY

## 2024-09-12 NOTE — Plan of Care
 Problem: Adult Inpatient Plan of CareGoal: Plan of Care ReviewOutcome: Interventions implemented as appropriateFlowsheets (Taken 09/12/2024 1759)Progress: no changePlan of Care Reviewed With: patient Problem: Adult Inpatient Plan of CareGoal: Patient-Specific Goal (Individualized)Outcome: Interventions implemented as appropriateFlowsheets (Taken 09/12/2024 0800)What Anxieties, Fears, Concerns or Questions Do You Have About Your Care?: Abdominal and coccyx pain.Individualized Preferences and Care Needs: Pain managment, GI monitor.Patient Centered Long Term Goal: No complications after discharge.Patient Centered Daily Goal: Pain remains below 4/10 level today. Plan of Care Overview/ Patient StatusPatient is alert and oriented x4, calm and cooperative well with care. VSS. Patient has a chief complaint of abdominal pain, nausea, and headache. Oxycodone  and tylenol  were given as needed, however the patient reported migraine headache, unable to given Tylenol  because it is not due, patient declined Oxycodone , notified Dr. Gerry. Patient reported nausea after having some tater tots. It was managed well with IV Zofran , Scopolamine patch is behind left ear. TF Osmolite 1.5 is continuing at 50 ml/hr. Flushed well, meds via G tube per order, and she was able to take lasix and Spironolactone  orally. Education provided throughout the day about G tube care and medications. Resp: room air, denied shortness of breath, no respiratory distress. C/V: Denied chest pain. BP/HR stable.GI: Abdomen is soft, Calories counts. Patient reported last BM was 9/27, requested suppositories and administered with no effect. GU: Void adequately in the bathroom.Mobility: Ambulate independently in the room. Safety: Patient calls appropriately. Support: Talked to friends and sister via phone. Emotional support provided. 1900 The patient sat at bedside and was shaking in tear, stating, I give up. Declined harming herself/suicide attempt. Patient stated that she had the same episode of panic attack like this last week.Emotional support provided and patient was able to calm down around 1910.  Notified Dr. Evern. Bebe KATHEE Lukes, RN

## 2024-09-12 NOTE — Progress Notes
 [facility]	Medicine Progress NoteTreatment Team:  HospitalistAttending Provider:  Gerry Norris, MD PCP: Debrah Houston TSubjective: Patient seen, Chart reviewedOvernight events notedFeels a bit better -asking for Tylenol  for headache-still has chronic abd painWants referrals to GI, Bariatric Surgery and Pain Management at [facility] now tolerating continuous TF but will need a pump and Nursing services at [facility] for TF Review of Allergies/Meds/Hx: I have reviewed the patient's: Allergies/PMH/Overnight events/MedsObjective: Vitals:Temp:  [98 ?F (36.7 ?C)-98.4 ?F (36.9 ?C)] 98 ?F (36.7 ?C)Pulse:  [77-94] 85Resp:  [16-18] 18BP: (111-118)/(73-79) 118/79SpO2:  [100 %] 100 %Device (Oxygen Therapy): room airI/O's: Gross Totals (Last 24 hours) at 09/12/2024 1354Last data filed at 09/12/2024 0900Intake 1230 ml Output 2900 ml Net -1670 ml Physical Exam: GENERAL/Constitutional: Young woman, not in acute distressHEENT: Pallor+RESPIRATORY: No SOB, Breath sounds vesicular CVS: S1 and S2 heard and normalGI: abdomen soft and non-tender, No hepatosplenomegaly, G-tube in place with no evidence for infection at the siteMUSCULOSKELETAL:   no edemaNEURO: Awake, alert and oriented to person and place. Motor power -moves all extremitiesSKIN: No Rashes/Lesions.PSYCH: anxiousLabs: Prothrombin Time Date Value Ref Range Status 05/04/2016 11.1 9.8 - 11.8 seconds Final PTT Date Value Ref Range Status 05/04/2016 25.8 23.9 - 31.9 seconds Final INR Date Value Ref Range Status 05/04/2016 1.03 0.90 - 1.10 Final Complete Blood CountResults in Past 7 DaysResult Component Current Result Hematocrit 28.60 (L) (09/10/2024) Hemoglobin 9.2 (L) (09/10/2024) MCH 32.9 (09/10/2024) MCHC 32.2 (09/10/2024) MCV 102.1 (H) (09/10/2024) MPV 9.0 (09/10/2024) Platelets 424 (H) (09/10/2024) RBC 2.80 (L) (09/10/2024) WBC 4.2 (09/10/2024) lBasic Metabolic Panel:Lab Results Component Value Date  GLU 85 09/12/2024  BUN 17 09/12/2024  CREATININE 0.50 09/12/2024  CO2 22 09/12/2024  CL 105 09/12/2024  NA 137 09/12/2024  K 4.7 09/12/2024  CALCIUM 8.4 (L) 09/12/2024 Diagnostics:ECG/Tele Events: Assessment And Plan 28 year old female with  PMH of  obesity s/p Roux-en-Y with  complicated course including perforation of marginal ulcer reportedly from  nonadherence with PPI with Arlyss patch, stricture requiring several EGD dilatations, multiple hospitalizations for related complaints including chronic abd pain, N&V most recently 9/9-23 and poor nutritional status, s/p PEG for TFs but tolerating PO intermittently due to nausea and vomiting now admitted for abd pain, nausea and vomiting and  lethargy Lethargy on presentation-unclear etiology-Toxicology screen was not sentChronic abd pain/Nausea and vomiting/Hx of non-cirrhotic portal HTNAppears to be better today-monitor for nowContinue Zofran  prnConsider GI consult  if she is symptomaticHas been on Oxycodone  in the past for short periods of timeContinue Oxycodone , Lyrica for nowContinue BID PPI and CarafateResume diuretics for nowSevere malnutrition after Roux- en Y with complicated courseContinue TF as per Nutrition and regular diet as toleratedOn calorie counts as per NutritionPatient can be given an ambulatory referral to Bariatric surgery at DCBorderline low K-will give KCl today 40 meqRepeat labs in amDVT prophylaxis-SC LovenoxDiet-TF with TrayNot medically ready for DC todayEncourage ambulation as toleratedI have reviewed the patient's problem list and updated it as needed.Electronically Signed:Shakiyah Cirilo, Norris, MDContact info: MHB10/1/20251:51 PM

## 2024-09-12 NOTE — Plan of Care
 Problem: Adult Inpatient Plan of CareGoal: Plan of Care ReviewOutcome: Interventions implemented as appropriate Plan of Care Overview/ Patient StatusPatient is alert and oriented x4. VSS on room air. Pain managed with 5mg  oxycodone  and valium. Patient with PEG tube; tube feed running per orders. All meds per [facility]; pills crushed through tube. Patient tolerating small amounts of PO intake; nausea and vomiting reported to covering provider. One time reglan  IV given as ordered with positive effect per patient. Scopolamine patch behind left ear. Patient OOB independently to bathroom; voiding adequately. POC ongoing. Safety maintained, call bell in reach.BP 111/73  - Pulse 77  - Temp 98.4 ?F (36.9 ?C) (Oral)  - Resp 18  - Ht 5' 6 (1.676 m)  - Wt 61.7 kg  - LMP  (LMP Unknown)  - SpO2 100%  - Breastfeeding No  - BMI 21.95 kg/m? Electronically Signed by Vina Schillings, RN, September 12, 2024

## 2024-09-13 LAB — BASIC METABOLIC PANEL
BKR ANION GAP: 14 (ref 7–17)
BKR BLOOD UREA NITROGEN: 21 mg/dL — ABNORMAL HIGH (ref 6–20)
BKR BUN / CREAT RATIO: 30 — ABNORMAL HIGH (ref 8.0–23.0)
BKR CALCIUM: 9.3 mg/dL (ref 8.8–10.2)
BKR CHLORIDE: 100 mmol/L (ref 98–107)
BKR CO2: 23 mmol/L (ref 20–30)
BKR CREATININE DELTA: 0.2
BKR CREATININE: 0.7 mg/dL (ref 0.40–1.30)
BKR EGFR, CREATININE (CKD-EPI 2021): >60 mL/min/1.73m2 (ref >=60)
BKR GLUCOSE: 80 mg/dL (ref 70–100)
BKR POTASSIUM: 4.7 mmol/L (ref 3.3–5.3)
BKR SODIUM: 137 mmol/L (ref 136–144)

## 2024-09-13 LAB — URINE CULTURE

## 2024-09-13 MED ORDER — MISCELLANEOUS MEDICAL SUPPLY MISC
1.00 refills | Status: AC
Start: 2024-09-13 — End: 2024-09-14

## 2024-09-13 MED ORDER — SENNOSIDES 8.6 MG TABLET
8.6 | Freq: Two times a day (BID) | GASTROSTOMY | Status: DC
Start: 2024-09-13 — End: 2024-09-16
  Administered 2024-09-13: 17:00:00 8.6 mg via GASTROSTOMY

## 2024-09-13 MED ORDER — HYDROXYZINE HCL 10 MG TABLET
10 | Freq: Once | ORAL | Status: DC
Start: 2024-09-13 — End: 2024-09-13

## 2024-09-13 NOTE — Progress Notes
 [facility]	Medicine Progress NoteTreatment Team:  HospitalistAttending Provider:  Gerry Norris, MD PCP: Debrah Houston TSubjective: Patient seen, Chart reviewedOvernight events notedStates she had a migraine yesterday-a bit better todayWants referrals to GI, Bariatric Surgery and Pain Management at [facility] now tolerating continuous TF but will need a pump and Nursing services at DC for TF-per [facility] will do teaching on TF this afternoon Review of Allergies/Meds/Hx: I have reviewed the patient's: Allergies/PMH/Overnight events/MedsObjective: Vitals:Temp:  [97.8 ?F (36.6 ?C)-98.6 ?F (37 ?C)] 98.6 ?F (37 ?C)Pulse:  [91-115] 106Resp:  [16-18] 18BP: (93-123)/(60-86) 113/72SpO2:  [99 %-100 %] 100 %Device (Oxygen Therapy): room airI/O's: Gross Totals (Last 24 hours) at 09/13/2024 1439Last data filed at 09/13/2024 0944Intake 600 ml Output 1000 ml Net -400 ml Physical Exam: GENERAL/Constitutional: Young woman, not in acute distressHEENT: Pallor+RESPIRATORY: No SOB, Breath sounds vesicular CVS: S1 and S2 heard and normalGI: abdomen soft and non-tender, No hepatosplenomegaly, G-tube in place with no evidence for infection at the siteMUSCULOSKELETAL:   no edemaNEURO: Awake, alert and oriented to person and place. Motor power -moves all extremitiesSKIN: No Rashes/Lesions.PSYCH: anxiousLabs: Prothrombin Time Date Value Ref Range Status 05/04/2016 11.1 9.8 - 11.8 seconds Final PTT Date Value Ref Range Status 05/04/2016 25.8 23.9 - 31.9 seconds Final INR Date Value Ref Range Status 05/04/2016 1.03 0.90 - 1.10 Final Complete Blood CountResults in Past 7 DaysResult Component Current Result Hematocrit 28.60 (L) (09/10/2024) Hemoglobin 9.2 (L) (09/10/2024) MCH 32.9 (09/10/2024) MCHC 32.2 (09/10/2024) MCV 102.1 (H) (09/10/2024) MPV 9.0 (09/10/2024) Platelets 424 (H) (09/10/2024) RBC 2.80 (L) (09/10/2024) WBC 4.2 (09/10/2024) lBasic Metabolic Panel:Lab Results Component Value Date  GLU 80 09/13/2024  BUN 21 (H) 09/13/2024  CREATININE 0.70 09/13/2024  CO2 23 09/13/2024  CL 100 09/13/2024  NA 137 09/13/2024  K 4.7 09/13/2024  CALCIUM 9.3 09/13/2024 Diagnostics:ECG/Tele Events: Assessment And Plan 28 year old female with  PMH of  obesity s/p Roux-en-Y with  complicated course including perforation of marginal ulcer reportedly from  nonadherence with PPI with Arlyss patch, stricture requiring several EGD dilatations, multiple hospitalizations for related complaints including chronic abd pain, N&V most recently 9/9-23 and poor nutritional status, s/p PEG for TFs but tolerating PO intermittently due to nausea and vomiting now admitted for abd pain, nausea and vomiting and  lethargy Lethargy on presentation-unclear etiology-Toxicology screen was not sentChronic abd pain/Nausea and vomiting/Hx of non-cirrhotic portal HTNAppears to be better today-monitor for nowContinue Zofran  prnHas been on Oxycodone  in the past for short periods of timeContinue Oxycodone , Lyrica for nowContinue BID PPI and CarafateResume diuretics for nowSevere malnutrition after Roux- en Y with complicated courseContinue TF as per Nutrition and regular diet as toleratedCompleted calorie counts  Nutrition note appreciatedPatient can be given an ambulatory referral to [facility]ariatric [facility] at [facility] labs in amDVT prophylaxis-SC LovenoxDiet-TF with TrayNot medically ready for DC today-awaiting teaching on TFPossible DC by tomorrow with services if stableEncourage ambulation as toleratedReferrals to Bariatric Surgery, GI and Pain management placedOsmolite TF enterred on DC navigator as a DME MISC order as per CMI have reviewed the patient's problem list and updated it as needed.Electronically Signed:Meriam Chojnowski, Norris, MDContact info: MHB10/2/20251:51 PM

## 2024-09-13 NOTE — Plan of Care
 Problem: Adult Inpatient Plan of CareGoal: Plan of Care ReviewOutcome: Interventions implemented as appropriate Plan of Care Overview/ Patient StatusPatient alert/ oriented x 4, calm and cooperative with care.  POC, meds, tx reviewed- patient states understanding of teaching.  Independent in room.  Abdomen tender, c/o cramping.  Valium and oxy given for pain with fair effecct.  Tolerating diet with no n/v.  FT infusing as ordered.  Afebrile, VSS.  Safety maintained at all times, plan of care ongoing.

## 2024-09-13 NOTE — Plan of Care
 Problem: Adult Inpatient Plan of CareGoal: Plan of Care ReviewOutcome: Interventions implemented as appropriate Plan of Care Overview/ Patient StatusPatient is alert and oriented x4. VSS on room air. Pain managed with 5mg  oxycodone  and valium. Patient endorses migraine; covering provider contacted; tx ordered but patient fell asleep and did not receive it. Upon waking up patient says headache had passed. Patient with PEG tube; tube feed running per orders. All meds per [facility]; pills crushed through tube. Patient tolerating small amounts of PO intake; no nausea or vomiting this shift. Scopolamine patch behind left ear. Patient OOB independently to bathroom; voiding adequately. POC ongoing. Safety maintained, call bell in reach. BP 93/60  - Pulse (!) 92  - Temp 98.2 ?F (36.8 ?C) (Oral)  - Resp 18  - Ht 5' 6 (1.676 m)  - Wt 61.7 kg  - LMP  (LMP Unknown)  - SpO2 100%  - Breastfeeding No  - BMI 21.95 kg/m? Electronically Signed by Vina Schillings, RN, September 13, 2024

## 2024-09-13 NOTE — Plan of Care
 Gina Aguilar					Location: VE3S/S331-28 y.o., female				Attending: Gerry Norris, MD	Admit Date: 09/08/2024			[id number] LOS: 4 days Nutrition Follow-up Note:Nutrition Recommendations:Continue regular diet as toleratedChange TF to Osmolite 1.5 @ 50 ml/hr x 22 hours via PEGTF needs to be held 1 hour pre and post prevacid administrationThis provides 1650 kcal, 75 gm protein, and 914 ml free H20 in 1200 ml total volume per day and meets 97% of estimated nutrient needs Elevate HOB, maintain 30-45 degrees for aspiration precautionsContinue to monitor for signs of intolerance; abdominal distention, residuals >570mL, N/V/D Recommend minimum 30mL free water flushes Q4 to maintain tube patency, or free water flushes per medical team discretion Will d/c calorie countCurrent Diet Order: Tube feed with tray- regular diet, Osmolite 1.5 @ goal rate 50 ml/hr x 24 hoursHPI: Per MD note 67/66 28 year old female with  PMH of  obesity s/p Roux-en-Y with  complicated course including perforation of marginal ulcer reportedly from  nonadherence with PPI with Arlyss patch, stricture requiring several EGD dilatations, multiple hospitalizations for related complaints including chronic abd pain, N&V most recently 9/9-23 and poor nutritional status, s/p PEG for TFs but tolerating PO intermittently due to nausea and vomiting now admitted for abd pain, nausea and vomiting and  lethargySubjective: Nutrition follow-up and calorie count results. Calorie count shows pt meeting 17-20% of estimated kcal needs, 22-28% of estimated protein needs via PO intake. Pt continues with chronic abdominal pain however appears to be tolerating continuous feeds at goal rate- residuals wnl. Of note LBM 9/27 per chart review, notified covering provider MD Konstantinidis. Pt now receiving prevacid, new TF recommendations stated above and communicated with covering provider.Calorie Count Results:            Day 1: 9/29Breakfast: 100% bacon, 100% eggs- 200 kcal, 16 gm proteinLunch: none recordedDinner: none recordedTotal: 200 kcal, 16 gm protein             Day 2: 9/30Breakfast: 2 bites bacon, 1 bite pancakes, 100% apple juice- 57 kcal, 0 gm proteinLunch: 100% apple juice, 50% peaches, 75% chicken sandwich- 439 kcal, 28 gm proteinDinner: 100% apple juice, 25% mashed potato (with gravy), 25% salad (with dressing), 25% meatloaf (with gravy)- 215 kcal. 7 gm proteinTotal: 696 kcal, 35 gm protein             Day 3: 10/1Breakfast: 100% scrambled egg, 50% apple juice, 25% french toast (with syrup)- 253 kcal, 13 gm proteinLunch: none recordedDinner: none recordedTotal: 253 kcal, 13 gm protein             3-day Average: 383 kcal, 21 gm protein            % of Estimated Needs: 17-20% of estimated kcal needs, 22-28% of estimated protein needsFood Allergies: Allergies[1]Religious/Ethnic Needs: noI/Os: LBM 9/27/25Intake/Output Summary (Last 24 hours) at 09/13/2024 0741Last data filed at 09/12/2024 1900Gross per 24 hour Intake 1410 ml Output 1000 ml Net 410 ml  Medications: lasix, prevacid daily, reglan , multivitamin with folic acid, miralax, spironolactone , sucralfate , dulcolax prn, zofran  prnSkin: No pressure injuries noted per flowsheetBraden score 19Labs: Recent Labs   09/27/252328 09/28/250306 09/28/252000 09/29/250621 10/01/250714 NA 140  --  141 137 137 K 4.1  --  3.5 3.6 4.7 PHOS 4.0   < > 3.8 4.7*  --  MG 2.0   < > 2.0 1.9  --  CREATININE 0.60  --  0.60 0.60 0.50 BUN 15  --  12 9 17   < > = values in this interval not displayed. POCT BG range over the past 24 hours:  69-85. A1c= 3.4% (09/10/24)Lytes phos 4.7 highAnthropometrics- Height: 5' 6 (1.676 m)- Current weight: 61.7 kg- BMI: Body mass index is 21.95 kg/m?SABRAAdditional weight information: Significant weight loss x 13 months Wt Readings from Last 20 Encounters: 09/09/24 61.7 kg 07/21/23 134.4 kg 06/08/23 (!) 145 kg 08/30/22 (!) 186.4 kg 02/08/22 (!) 184.2 kg 08/10/21 (!) 182.8 kg 12/30/20 (!) 191.3 kg 09/30/20 (!) 196 kg 09/24/20 (!) (P) 196.2 kg 09/15/20 (!) 200 kg 07/24/20 (!) 195.3 kg 06/17/20 (!) 194.7 kg 05/13/20 (!) 194.2 kg 03/28/20 (!) 192 kg 02/25/20 (!) 195 kg 12/03/19 (!) 191.9 kg 09/04/19 (!) 191.7 kg 04/26/19 (!) 193.7 kg 04/12/19 (!) 193.7 kg 11/30/18 (!) 194 kg Physical findings:- Muscle Loss:                       Temple: Severe            Clavicle: Severe            Deltoid: Severe- Edema: none noted Estimated Nutrition Requirements:Kcal/day: 1851-2160 kcal                  (30-35 kcal/kg)Protein/day: 74-93 gm                       (1.2-1.5 gm/kg)Fluids/day: Fluid management per medical team discretion. 1851-2160 ml (29mL/kcal)Needs based on: Current weight 61.7 kg, age, weight status, severe malnutritionNutrition Diagnosis:Nutrition Diagnosis/Problem: Severe MalnutritionRelated to: chronic factorsAs Evidenced by: Severe muscle wasting, PO intake <75% >1 monthComments: ongoingInterventions:Nutrition Prescription: Continue regular diet as toleratedChange TF to Osmolite 1.5 @ 50 ml/hr x 22 hours via PEGTF needs to be held 1 hour pre and post prevacid administrationThis provides 1650 kcal, 75 gm protein, and 914 ml free H20 in 1200 ml total volume per day and meets 97% of estimated nutrient needs Elevate HOB, maintain 30-45 degrees for aspiration precautionsContinue to monitor for signs of intolerance; abdominal distention, residuals >573mL, N/V/D Recommend minimum 30mL free water flushes Q4 to maintain tube patency, or free water flushes per medical team discretion Will d/c calorie countPlan: 	Meal and Snacks: General/Healthful diet	Enteral and Parenteral : Enteral formula/solution, Feeding tube flush	Vitamin and Mineral Supplements: Multivitamin/mineral, Multi-trace elements	Nutrition-related medication management: Medications (per MD)	D/C & transfer of care to new setting or provider: Collaborative referal to other providers (D/C plans unknown at this time. RD to follow while inpatient) Goals: Patient will consume >50% of all meals prior to next nutrition assessment-- not met, continue  Patient will advance/tolerate TF at goal prior to next nutrition assessment-- met, continue  Patient will receive at least 90% of enteral volume goal prior to next nutrition assessment-- met, continue  Monitoring/Evaluation: Food/Nutrition-Related Outcomes:            food and nutrient intake/administration.Anthropometric Outcomes:            Height, weight, BMI, Weight historyBiochemical Data, Medical Tests, and Procedure Outcomes:            Labs (BMP).Nutrition-Focused Physical Finding Outcomes:            Physical appearance, muscle and fat wasting, swallow function, appetite.RD following per [facility] Clinical Nutrition Standards of Care.Signed by Registered Dietitian: Electronically signed by Thersia Reef, MS, RD, CD-N 10/2/2025Please contact via Epic Secure Chat [phone] with any questions/concernsFor prosource/banatrol please call [phone] note: Nutrition is a consult only service on weekends and holidays. Please enter a consult in EPIC if assistance is needed on a weekend or holiday or reach out to [facility] Food and Nutrition Services  Adult Dietitian (Weekend) [phone] on Epic Secure Chat to contact the covering RD.  [1] AllergiesAllergen Reactions  Lidocaine  Hives and Itching

## 2024-09-14 LAB — BASIC METABOLIC PANEL
BKR ANION GAP: 5 — ABNORMAL LOW (ref 7–17)
BKR BLOOD UREA NITROGEN: 21 mg/dL — ABNORMAL HIGH (ref 6–20)
BKR BUN / CREAT RATIO: 42 — ABNORMAL HIGH (ref 8.0–23.0)
BKR CALCIUM: 9 mg/dL (ref 8.8–10.2)
BKR CHLORIDE: 103 mmol/L (ref 98–107)
BKR CO2: 29 mmol/L (ref 20–30)
BKR CREATININE DELTA: -0.2
BKR CREATININE: 0.5 mg/dL (ref 0.40–1.30)
BKR EGFR, CREATININE (CKD-EPI 2021): >60 mL/min/1.73m2 (ref >=60)
BKR GLUCOSE: 76 mg/dL (ref 70–100)
BKR POTASSIUM: 4.3 mmol/L (ref 3.3–5.3)
BKR SODIUM: 137 mmol/L (ref 136–144)

## 2024-09-14 MED ORDER — BUTALBITAL-ACETAMINOPHEN-CAFFEINE 50 MG-325 MG-40 MG TABLET
50-325-40 | Freq: Four times a day (QID) | ORAL | Status: DC | PRN
Start: 2024-09-14 — End: 2024-09-16
  Administered 2024-09-14 – 2024-09-15 (×4): 50-325-40 mg via ORAL

## 2024-09-14 MED ORDER — KETOROLAC 15 MG/ML INJECTION SOLUTION
15 | Freq: Four times a day (QID) | INTRAVENOUS | Status: DC | PRN
Start: 2024-09-14 — End: 2024-09-16
  Administered 2024-09-14 – 2024-09-16 (×6): 15 mL via INTRAVENOUS

## 2024-09-14 MED ORDER — OXYCODONE IMMEDIATE RELEASE 5 MG TABLET
5 | Freq: Three times a day (TID) | GASTROSTOMY | 1 refills | Status: CP | PRN
Start: 2024-09-14 — End: ?
  Administered 2024-09-14 – 2024-09-15 (×3): 5 mg via GASTROSTOMY

## 2024-09-14 MED ORDER — ACETAMINOPHEN 325 MG TABLET
325 | Freq: Four times a day (QID) | GASTROSTOMY | Status: DC | PRN
Start: 2024-09-14 — End: 2024-09-16
  Administered 2024-09-15 – 2024-09-16 (×3): 325 mg via GASTROSTOMY

## 2024-09-14 MED ORDER — DICYCLOMINE 10 MG CAPSULE
10 | Freq: Four times a day (QID) | ORAL | Status: DC
Start: 2024-09-14 — End: 2024-09-16
  Administered 2024-09-14 – 2024-09-16 (×9): 10 mg via ORAL

## 2024-09-14 MED ORDER — MISCELLANEOUS MEDICAL SUPPLY MISC
1 refills | Status: AC
Start: 2024-09-14 — End: ?

## 2024-09-14 NOTE — Plan of Care
 Plan of Care Overview/ Patient StatusProblem: Adult Inpatient Plan of CareGoal: Readiness for Transition of CareOutcome: Interventions implemented as appropriate Social work consult for Patient does not feel she has a safe place to go home to. Does not want to burden current friend that she lives with.Chart reviewed. Presented to patient's bedside and introduced self and role, patient verbalized understanding. Patient was on the phone with her surgeon, Dr. Berkeley when I entered the room. She asked me to speak with him and handed me the phone. I did not share any information about Ms. Worthington on this call. Dr. Berkeley shared that he feels Ms. Costanzo should apply for disability. He also shared that he discussed possibly going to STR with her. Ended call.I provided Ms. Chauca with information/hand outs on applying for disability. Ms. Gerstenberger expressed difficulty getting up her three flights of stairs at home. I contacted CM Yin, and requested PT eval for possible STR placement.Ms. Benard shared that she has her own apartment in [place], but was planning on moving in with a friend. She reports that when she decided against moving in with this friend, friend became angry and has been refusing to bring Ms. Spickler her belongings (including her apartment keys and car keys). Ms. Ziff reports that this friend may be bringing her belongings to the hospital this evening. I informed Ms. Ungar that if her friend still does not bring her possessions, she should consider contacting the non-emergency police line. Ms. Musto reports that this friend has not threatened or harmed her in any way, but did say some malicious things to her (I.e. I hope you rot in the hospital). Ms. Rohlfs shared complex family and friend dynamics.Ms. Mangal shared financial challenges. She reports that she lost her job due to her medical issues and her [place] Paid Leave has run out. She reports worry about affording her rent and her car payments. Provided emotional support and validation. I suggested that Ms. Kunkler call her landlord and discuss her situation. I also suggested that she contact [address] to inquire about any rental/financial assistance programs that may be available.  Ms. Vanaken reports feeling tired and not wanting to be in pain anymore. She shared her journey following her bariatric surgery. She reports no suicidal ideation, plan, or intent. She reports she has a therapist at [facility] she has been seeing on and off since 2021. She reports plan to speak with therapist to check in today. Patient reports that she needs her termination letter from her job, which is currently in her apartment. I agreed to print it if it is emailed to me. She contacted her employer, who is supposed to email the letter to me this afternoon. I made a to-do list for patient with list of calls to make today to help her keep track.Case Management will take lead to arrange post -acute care services and continue to work with the care team as the patient progresses towards discharge.Olam Carry, LCSWSocial Work Case ManagerYNHH Case Management Department

## 2024-09-14 NOTE — Progress Notes
 [facility]	Medicine Progress NoteTreatment Team:  HospitalistAttending Provider:  Zachary Cox, MDPCP: Debrah Houston TSubjective: Patient seen, Chart reviewedOvernight events notedFor now tolerating continuous TF but will need a pump and Nursing services at DC for TFReports that her housing plan fell through as of this morning as her friend is not allowing her to have her keys or other belongs. Tearful, saying she is not able to get into her apartment does not know what she will do at discharge. Is denies any abdominal pain, n/v this morning.  Review of Allergies/Meds/Hx: I have reviewed the patient's: Allergies/PMH/Overnight events/MedsObjective: Vitals:Temp:  [97.8 ?F (36.6 ?C)-98.8 ?F (37.1 ?C)] 97.8 ?F (36.6 ?C)Pulse:  [66-107] 86Resp:  [17-19] 19BP: (95-115)/(61-75) 97/66SpO2:  [98 %-100 %] 100 %Device (Oxygen Therapy): room airI/O's: Gross Totals (Last 24 hours) at 09/14/2024 1204Last data filed at 09/14/2024 0945Intake 900 ml Output 400 ml Net 500 ml Physical Exam: GENERAL/Constitutional: Young woman, not in acute distressHEENT: Pallor+RESPIRATORY: No SOB, Breath sounds vesicular CVS: S1 and S2 heard and normalGI: abdomen soft and non-tender, No hepatosplenomegaly, G-tube in place with no evidence for infection at the siteMUSCULOSKELETAL:   no edemaNEURO: Awake, alert and oriented to person and place. Motor power -moves all extremitiesSKIN: No Rashes/Lesions.PSYCH: anxious, somewhat labile mood, tearfulLabs: Prothrombin Time Date Value Ref Range Status 05/04/2016 11.1 9.8 - 11.8 seconds Final PTT Date Value Ref Range Status 05/04/2016 25.8 23.9 - 31.9 seconds Final INR Date Value Ref Range Status 05/04/2016 1.03 0.90 - 1.10 Final Complete Blood CountResults in Past 7 DaysResult Component Current Result Hematocrit 28.60 (L) (09/10/2024) Hemoglobin 9.2 (L) (09/10/2024) MCH 32.9 (09/10/2024) MCHC 32.2 (09/10/2024) MCV 102.1 (H) (09/10/2024) MPV 9.0 (09/10/2024) Platelets 424 (H) (09/10/2024) RBC 2.80 (L) (09/10/2024) WBC 4.2 (09/10/2024) lBasic Metabolic Panel:Lab Results Component Value Date  GLU 76 09/14/2024  BUN 21 (H) 09/14/2024  CREATININE 0.50 09/14/2024  CO2 29 09/14/2024  CL 103 09/14/2024  NA 137 09/14/2024  K 4.3 09/14/2024  CALCIUM 9.0 09/14/2024 Diagnostics:ECG/Tele Events: Assessment And Plan 28 year old female with  PMH of  obesity s/p Roux-en-Y with  complicated course including perforation of marginal ulcer reportedly from  nonadherence with PPI with Arlyss patch, stricture requiring several EGD dilatations, multiple hospitalizations for related complaints including chronic abd pain, N&V most recently 9/9-23/2025 and poor nutritional status, s/p PEG for TFs but tolerating PO intermittently due to nausea and vomiting now admitted for abd pain, nausea and vomiting and  lethargy Lethargy on presentation-unclear etiology-Toxicology screen was not sent - resolvedChronic abd pain/Nausea and vomiting/Hx of non-cirrhotic portal HTNAppears to be better today-monitor for nowContinue Zofran  prnHas been on Oxycodone  in the past for short periods of timeContinue Oxycodone , Lyrica for now - taper as toleratedContinue BID PPI and CarafateResume diuretics for nowSevere malnutrition after Roux- en Y with complicated courseContinue TF as per Nutrition and regular diet as toleratedCompleted calorie counts  Nutrition note appreciatedPatient can be given an ambulatory referral to Bariatric surgery at [facility]VT prophylaxis-SC LovenoxDiet-TF with TrayNot ready for DC today- awaiting safe dispoEncourage ambulation as toleratedReferrals to Bariatric Surgery, GI and Pain management placedJevity 1.5 TF enterred on DC navigator as a DME MISC order as per CMI have reviewed the patient's problem list and updated it as needed.Electronically Signed:Pari Lombard, MDHospitalistOctober [id number], 2025

## 2024-09-14 NOTE — Plan of Care
 Problem: Adult Inpatient Plan of CareGoal: Plan of Care ReviewOutcome: Interventions implemented as appropriate Plan of Care Overview/ Patient Status1900-0700Pt Aox4, cooperative with care. All safety measures maintained; including call bell in reach and bed in lowest position. OOBxIND. VSS on RA. No s/s of respiratory/cardiac distress. No c/o n/v/d; scop patch to L ear, endorsing constipation - ATCs and PRN supp adm. Pain/anxiety managed with PRN oxycodone /vallum/tylenol /atarax. PEG CDI; Osmolite TF @50cc /hr. Abd TTP, ND; endorsing flatus. Voiding good amounts. Skin as documented. Pt provided with increased emotional/mental support as needed/able. Pt confirms understanding of POC and medications. All needs met at this time. Electronically Signed by Massie Ply, RN, October 3, 2025Pt reports BM this AM f/ supp.

## 2024-09-14 NOTE — Plan of Care
 Problem: Adult Inpatient Plan of CareGoal: Readiness for Transition of CareOutcome: Interventions implemented as appropriate Plan of Care Overview/ Patient StatusCase Management/Social Work Length of Stay NoteHospital Day: 5 PT Disposition Recommendations:   pending PT Equipment Recommendations for Discharge:   PT AMPAC Score:   Barrier(s) to Discharge: not medically ready, intolerance peg tube feed of Osmolite 1.5 change it to Jevity 1.5. reporting his roommate doesn't bring her belongings including apt key and car key. SW talked with pt and gave her information how to obtaining her stuff, disability application, and work termination Museum/gallery exhibitions officer) are Resolved: [facility] referral snet for peg tube feed. Bedside teaching completed. Set up HCS. Outstanding Barrier(s) to [employer]; Who is responsible for completion: monitor jevity 1.5 tube feeding tolerance. pending PT eval   PASRR: PASRR completed and approved: [facility] Completed: 10/03/25Assessment ID#: [id number]  PRI:  n/a Update on:10/3 Discharge Disposition:  pending  Facilities:   []  N/A  [x]  Pending []  ObtainedHome Care: []  N/A  [x]  Pending  []  ObtainedInsurance Authorization: [x]  N/A  []  Pending  []  ObtainedTransportation Plan: husky D transportation.  YNHH ONLY CCMT following:  N/A Erin Humble RN BSNCare ManagerYale [facility] HospitalMHB [phone]

## 2024-09-14 NOTE — Plan of Care
 Nutrition Note:MD Consult received for changing TF formula, per case management pt told [facility] nurse who did bedside teaching yesterday that Osmolite 1.5 gives her abdominal cramps . Pt already being followed by Nutrition Services with last assessment completed on 10/2; please refer to this note for further nutrition details. Pt initially admitted on Jevity 1.5, at home was receiving 4-5 boluses Jevity up to 240 cc each per day. TF changed to continuous osmolite 1.5 to trial tolerance of low fiber formula. Would recommend transitioning back to Jevity 1.5 however remaining continuous, if pt does not tolerate will consider different formula. Estimated Nutrition Requirements:Kcal/day: 1851-2160 kcal                  (30-35 kcal/kg)Protein/day: 74-93 gm                       (1.2-1.5 gm/kg)Fluids/day: Fluid management per medical team discretion. 1851-2160 ml (77mL/kcal)Needs based on: Current weight 61.7 kg, age, weight status, severe malnutritionInterventions:Continue regular diet as toleratedChange TF to Jevity 1.5 @ 50 ml/hr x 22 hours via PEGTF needs to be held 1 hour pre and post prevacid administrationThis provides 1650 kcal, 70 gm protein, and 836 ml free H20 in 1100 ml total volume per day and meets 97% of estimated nutrient needs Elevate HOB, maintain 30-45 degrees for aspiration precautionsContinue to monitor for signs of intolerance; abdominal distention, residuals >555mL, N/V/D Recommend minimum 30mL free water flushes Q4 to maintain tube patency, or free water flushes per medical team discretion RDN following per [facility] Clinical Nutrition Standards of Care.Registered Dietitian Nutritionist: Electronically signed by Thersia Reef, MS, RD, CD-N 10/3/2025Please contact via Epic Secure Chat [phone] with any questions/concernsFor prosource/banatrol please call [phone] note: Nutrition is a consult only service on weekends and holidays. Please enter a consult in EPIC if assistance is needed on a weekend or holiday or reach out to [facility] Food and Nutrition Services Adult Dietitian (Weekend) [phone] on Epic Secure Chat to contact the covering RD.

## 2024-09-14 NOTE — Plan of Care
 Problem: Adult Inpatient Plan of CareGoal: Plan of Care ReviewOutcome: Interventions implemented as appropriateGoal: Patient-Specific Goal (Individualized)Outcome: Interventions implemented as appropriateGoal: Absence of Hospital-Acquired Illness or InjuryOutcome: Interventions implemented as appropriateGoal: Optimal Comfort and WellbeingOutcome: Interventions implemented as appropriateGoal: Readiness for Transition of CareOutcome: Interventions implemented as appropriate Plan of Care Overview/ Patient StatusAlert and oriented. Using call bell for needs. Oxycodone  for abd pain given with mod. Relief. C/O migraine today Dr Angie aware and up to see patient. Fiorcet, toradol  given with mod effect. Taking in some solid PO today, able to tolerate since breakfast. Vomited small amt after eating eggs with cheese this AM. Zofran  given with good effect. OOB ad lib to BR. Voiding. Tolerating TF at 50cc/hr. Changed to jevity as this is what patient takes at home. Social work in to see patient regarding safe housing, financial assisitance etc. No d/c date as of now. All meds/POC explained.

## 2024-09-15 LAB — BASIC METABOLIC PANEL
BKR ANION GAP: 8 (ref 7–17)
BKR BLOOD UREA NITROGEN: 28 mg/dL — ABNORMAL HIGH (ref 6–20)
BKR BUN / CREAT RATIO: 40 — ABNORMAL HIGH (ref 8.0–23.0)
BKR CALCIUM: 8.8 mg/dL (ref 8.8–10.2)
BKR CHLORIDE: 107 mmol/L (ref 98–107)
BKR CO2: 26 mmol/L (ref 20–30)
BKR CREATININE DELTA: 0.2
BKR CREATININE: 0.7 mg/dL (ref 0.40–1.30)
BKR EGFR, CREATININE (CKD-EPI 2021): >60 mL/min/1.73m2 (ref >=60)
BKR GLUCOSE: 98 mg/dL (ref 70–100)
BKR POTASSIUM: 4.2 mmol/L (ref 3.3–5.3)
BKR SODIUM: 141 mmol/L (ref 136–144)

## 2024-09-15 MED ORDER — OXYCODONE IMMEDIATE RELEASE 5 MG TABLET
5 | Freq: Three times a day (TID) | GASTROSTOMY | 1 refills | Status: DC | PRN
Start: 2024-09-15 — End: 2024-09-16
  Administered 2024-09-15 – 2024-09-16 (×3): 5 mg via GASTROSTOMY

## 2024-09-15 NOTE — Plan of Care
 Problem: Adult Inpatient Plan of CareGoal: Plan of Care ReviewOutcome: Interventions implemented as appropriate Plan of Care Overview/ Patient Status1900-0700Pt Aox4, cooperative with care. All safety measures maintained; including call bell in reach and bed in lowest position. OOBxIND. VSS on RA. No s/s of respiratory/cardiac distress. No c/o n/v/d; scop patch to R ear. Pain/anxiety managed with PRN oxycodone /toradol /tylenol /vallum//atarax. PRN fioricet for migraine. PEG CDI, site cleansed, dressing changed; Jevity 1.5 @50cc /hr. Abd TTP, ND; endorsing flatus. Voiding good amounts. Skin as documented. Pt provided with emotional/mental support as needed/able. Pt confirms understanding of POC and medications. All needs met at this time. Electronically Signed by Massie Ply, RN, September 15, 2024
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
## 2024-09-15 NOTE — Progress Notes
 Inpatient Physical Therapy Note IP Adult PT Eval/Treat - 09/15/24 1258    Treatment Deferred  Treatment Deferred Patient declined treatment   Deferred Treatment Comments Two attempts made, pt declining both times. PT will eval as able.    Frequency/Equipment Recommendations  PT Frequency --   EVAL SUN   Schuyler Bora, PT, DPT

## 2024-09-15 NOTE — Plan of Care
 Problem: Adult Inpatient Plan of CareGoal: Plan of Care ReviewOutcome: Interventions implemented as appropriate Plan of Care Overview/ Patient StatusPatient alert/ oriented x 4, cooperative with care.  POC, meds, tx reviewed- patient states understanding of teaching.  Abdomen soft, tender, TF infusing as ordered.  No nausea/ vomiting so far today.  Pain managed with fioricet, tylenol , oxy and toradol .  OOB independently in room.  Voiding in bathroom, having loose stool.  Safety maintained at all times, plan of care ongoing.

## 2024-09-16 ENCOUNTER — Inpatient Hospital Stay: Admit: 2024-09-16 | Discharge: 2024-09-16 | Payer: MEDICAID

## 2024-09-16 LAB — BASIC METABOLIC PANEL
BKR ANION GAP: 6 — ABNORMAL LOW (ref 7–17)
BKR BLOOD UREA NITROGEN: 35 mg/dL — ABNORMAL HIGH (ref 6–20)
BKR BUN / CREAT RATIO: 50 — ABNORMAL HIGH (ref 8.0–23.0)
BKR CALCIUM: 8.9 mg/dL (ref 8.8–10.2)
BKR CHLORIDE: 101 mmol/L (ref 98–107)
BKR CO2: 27 mmol/L (ref 20–30)
BKR CREATININE DELTA: 0
BKR CREATININE: 0.7 mg/dL (ref 0.40–1.30)
BKR EGFR, CREATININE (CKD-EPI 2021): >60 mL/min/1.73m2 (ref >=60)
BKR GLUCOSE: 78 mg/dL (ref 70–100)
BKR POTASSIUM: 4.9 mmol/L (ref 3.3–5.3)
BKR SODIUM: 134 mmol/L — ABNORMAL LOW (ref 136–144)

## 2024-09-16 LAB — CBC WITH AUTO DIFFERENTIAL
BKR WAM ABSOLUTE IMMATURE GRANULOCYTES.: 0.01 x 1000/ÂµL (ref 0.00–0.30)
BKR WAM ABSOLUTE LYMPHOCYTE COUNT.: 1.41 x 1000/ÂµL (ref 0.60–3.70)
BKR WAM ABSOLUTE NRBC: 0 x 1000/ÂµL (ref 0.00–1.00)
BKR WAM ANC (ABSOLUTE NEUTROPHIL COUNT): 2.71 x 1000/ÂµL (ref 2.00–7.60)
BKR WAM BASOPHIL ABSOLUTE COUNT.: 0.02 x 1000/ÂµL (ref 0.00–1.00)
BKR WAM BASOPHILS: 0.4 % (ref 0.0–1.4)
BKR WAM EOSINOPHIL ABSOLUTE COUNT.: 0.16 x 1000/ÂµL (ref 0.00–1.00)
BKR WAM EOSINOPHILS: 3.2 % (ref 0.0–5.0)
BKR WAM HEMATOCRIT: 29.2 % — ABNORMAL LOW (ref 35.00–45.00)
BKR WAM HEMOGLOBIN: 9.6 g/dL — ABNORMAL LOW (ref 11.7–15.5)
BKR WAM IMMATURE GRANULOCYTES: 0.2 % (ref 0.0–1.0)
BKR WAM LYMPHOCYTES: 28.4 % (ref 17.0–50.0)
BKR WAM MCH: 33.6 pg — ABNORMAL HIGH (ref 27.0–33.0)
BKR WAM MCHC: 32.9 g/dL (ref 31.0–36.0)
BKR WAM MCV: 102.1 fL — ABNORMAL HIGH (ref 80.0–100.0)
BKR WAM MONOCYTE ABSOLUTE COUNT.: 0.66 x 1000/ÂµL (ref 0.00–1.00)
BKR WAM MONOCYTES: 13.3 % — ABNORMAL HIGH (ref 4.0–12.0)
BKR WAM MPV: 9.4 fL (ref 8.0–12.0)
BKR WAM NEUTROPHILS: 54.5 % (ref 39.0–72.0)
BKR WAM NUCLEATED RED BLOOD CELLS: 0 % (ref 0.0–1.0)
BKR WAM PLATELETS: 399 x1000/ÂµL (ref 150–420)
BKR WAM RDW-CV: 13.6 % (ref 11.0–15.0)
BKR WAM RED BLOOD CELL COUNT.: 2.86 M/ÂµL — ABNORMAL LOW (ref 4.00–6.00)
BKR WAM WHITE BLOOD CELL COUNT: 5 x1000/ÂµL (ref 4.0–11.0)

## 2024-09-16 MED ORDER — DICYCLOMINE 10 MG CAPSULE
10 | ORAL_CAPSULE | Freq: Four times a day (QID) | ORAL | 1 refills | 20.00000 days | Status: AC
Start: 2024-09-16 — End: ?

## 2024-09-16 MED ORDER — METOCLOPRAMIDE 5 MG/ML INJECTION SOLUTION
5 | Freq: Once | INTRAVENOUS | Status: CP
Start: 2024-09-16 — End: ?
  Administered 2024-09-16: 16:00:00 5 mL via INTRAVENOUS

## 2024-09-16 MED ORDER — DIAZEPAM 2 MG TABLET
2 | Freq: Four times a day (QID) | GASTROSTOMY | 1.00 refills | 28.00000 days | Status: AC | PRN
Start: 2024-09-16 — End: ?

## 2024-09-16 MED ORDER — LANSOPRAZOLE 3 MG/ML ORAL SUSPENSION
3 | Freq: Every day | GASTROSTOMY | 1 refills | 7.00000 days | Status: AC
Start: 2024-09-16 — End: ?

## 2024-09-16 MED ORDER — KETOROLAC 15 MG/ML INJECTION SOLUTION
15 | Freq: Once | INTRAVENOUS | Status: CP
Start: 2024-09-16 — End: ?
  Administered 2024-09-16: 16:00:00 15 mL via INTRAVENOUS

## 2024-09-16 MED ORDER — ACETAMINOPHEN 325 MG TABLET
325 | Freq: Once | ORAL | Status: DC
Start: 2024-09-16 — End: 2024-09-17

## 2024-09-16 NOTE — Discharge Summary
 [facility]-SrcMed/Surg Discharge SummaryPatient Data:  Patient Name: Gina Aguilar Admit date: 09/08/2024 Age: 28 y.o. Discharge date: 09/16/2024 DOB: 1996-02-09	 Discharge Attending Physician: No att. providers found  MRN: [id number]	 Discharged Condition: stable PCP: Debrah Edsel DASEN, PsyD  Disposition: AMA Principal Diagnosis: Chronic abdominal pain and nausea/vomiting following bariatric surgery Comorbidities Comorbidities present on admission:Obesity Marginal Ulcer Esophageal Stricture non-cirrhotic portal HTN    Secondary diagnoses occurring during hospitalization:Hypocalcemia  Post Discharge Follow Up Items: Issues to be Addressed Post Discharge:Bariatric Surgery and GI referral sent Social workAssistance with setting up tube feeds, does not tolerate bolus feeds and has no pump on self directed discharge Medication changes on discharge (Full medication list at conclusion of this summary) : Ordered bentyl and lansoprazoleValium was decreased from 5mg  to 2mg  q6 PRN during hospitalization, no script sent at this timePending Labs and Tests: Follow-up Information:[facility] [address] [facility] [place] [facility] [place]  [phone], Edsel DASEN [address] [place] [id number] Sp[facility] [address] DriveNew [facility] [place]  [phone] Dig[facility] at [address]  [address] [facility] [place]  [phone] No future appointments.Hospital Course: 28 yo F w complex history post Roux-en-Y (perforation of marginal ulcer iso noncompliance w PPI w Arlyss patch, stricture requiring several EGD dilatations, multiple hospitalizations for related complaints), chronic abd pain, non-cirrhotic portal HTN p/w abd pain and lethargy. Has had multiple hospitalizations following gastric bypass, typically treated at [facility], with abd pain, nausea, vomiting being common symptoms. Most recently admitted 9/9 to 9/23 iso poor nutritional status, G tube placed in OR and oral and tube feeds tolerated (although the latter not to target goal given abd pain, nausea, and vomiting). Was ultimately dc'd tolerating 4-5 boluses Jevity up to 240 cc each per day, and home PT and nursing services were arranged. #Acute on chronic Abdominal Pain#Severe MalnutritionShe was treated with zofran , oxycodone , and lyrica with good improvement and continued on home sucralfate . Bariatric surgery referral sent on discharge. Nutrition was consulted and she was transitioned from bolus to continuous tube feeds. At time of discharge was reciving Jevity 1.5 @ 50 ml/hr x 22 hours via PEG. Patient reports being unable to tolerate bolus feed and no pump was set up. Advised patient remaining inpatient though patient choose self directed d/c on 10/5. #Non-cirrhotic Portal HTNLast seen by [facility] 08/13/[facility]24. Previous doppler u/s w/ r/o of hepatic vein thrombsis. No previous evidence of SBP. She was resumed on home lasix and spironolactone  during hospitalization. #Macrocytic anemia#Fatigue#LethargyLikely iso anemia, poor nutrition. B12, folate WNL and iron panel showed low TIBC. Inpatient Consultants and summary of recommendations:Pertinent Procedures or Surgeries: Data: Pertinent lab findings:Recent Labs Lab 09/29/250621 WBC 4.2 HGB 9.2* HCT 28.60* PLT 424*  Recent Labs Lab 09/29/250621 NEUTROPHILS 37.6*  Recent Labs Lab 10/02/250607 10/03/250717 10/04/250452 NA 137 137 141 K 4.7 4.3 4.2 CL 100 103 107 CO2 23 29 26  BUN 21* 21* 28* CREATININE 0.70 0.50 0.70 GLU 80 76 98 ANIONGAP 14 5* 8  Recent Labs Lab 09/29/250621 10/01/250714 10/02/250607 10/03/250717 10/04/250452 CALCIUM 8.8   < > 9.3 9.0 8.8 MG 1.9  --   --   --   --  PHOS 4.7*  --   --   --   --   < > = values in this interval not displayed.  Recent Labs Lab 09/29/250621 ALT 20 AST 29 ALKPHOS 82 BILITOT 0.4 BILIDIR 0.2  No results for input(s): PTT, LABPROT, INR in the last 168 hours. Microbiology:No results for input(s): LABBLOO, LABURIN, LOWERRESPIRA  in the last 168 hours.Imaging: Imaging results last 1 week:  No results found.Diet:  Diet Tube Feed With TrayMobility: Highest Level of mobility - ACTUAL: Mobility Level 7, Walk 25+ feet, AM PAC 22-23  Physical Exam Discharge vital signs: Vitals:  09/16/24 0950 BP: (!) 95/58 Pulse: 83 Resp: 17 Temp: 97.8 ?F (36.6 ?C) Cognitive Status at Discharge: Baseline Alert and Oriented x 3Physical ExamConstitutional:     General: She is not in acute distress.   Appearance: She is not ill-appearing.    Comments: Exam deferred Neurological:    Mental Status: She is alert. History  Allergies Allergies     Noted Reactions  Lidocaine  05/09/2024   Hives, Itching    PMH PSH Past Medical History[1] Past Surgical History[2] Social History Family History Social History Tobacco Use  Smoking status: Never  Smokeless tobacco: Never Substance Use Topics  Alcohol use: Yes   Comment: socially  Family History Problem Relation Age of Onset  Hypertension Mother   Stroke Mother 55      HTN related  Dementia Paternal Grandmother     Discharge Medications  Discharge:  Medication List  Start    Dose Start Date Stop Date Comments Indications of Use dicyclomine 10 mg capsuleCommonly known as: BENTYLTake 1 capsule (10 mg total) by mouth 4 (four) times daily for 7 days. 10 mg     lansoprazole 3 mg/mL oral suspensionCommonly known as: PREVACID10 mLs (30 mg total) by Per G Tube route daily for 7 days. 30 mg September 17, 2024  This medication requires compounding.  Miscellaneous Medical SupplyUse as directed.Jecivity 1.5 or equivalent 50 ml x 22 hours via pump       These medications have changed    Dose Start Date Stop Date Comments Indications of Use diazePAM 2 mg tabletCommonly known as: VALIUM1 tablet (2 mg total) by Per G Tube route every 6 (six) hours as needed for anxiety (muscle spasm).What changed: medication strengthhow much to takehow to take thisreasons to take this 2 mg      Continue taking these medications    Dose Start Date Stop Date Comments Indications of Use calcium carbonate-vit D3-min 600 mg-10 mcg (400 unit) per tabletTake 1 tablet by mouth daily. 1 tablet     cyclobenzaprine 5 mg tabletCommonly known as: FLEXERILTake 1 tablet (5 mg total) by mouth 3 (three) times a day.      furosemide 20 mg tabletCommonly known as: LASIXTake 1 tablet (20 mg total) by mouth daily. 20 mg     ondansetron  8 mg disintegrating tabletCommonly known as: ZOFRAN -ODTTake 1 tablet (8 mg total) by mouth every 8 (eight) hours as needed. 8 mg     polyethylene glycol 17 gram packetCommonly known as: MIRALAXTake 1 packet (17 g total) by mouth daily as needed for constipation. 17 g     pregabalin 50 mg capsuleCommonly known as: LYRICATake 1 capsule (50 mg total) by mouth 2 (two) times a day.      scopolamine 1 mg transdermal patchCommonly known as: TRANSDERM-SCOPPlace 1 patch (1 mg total) onto the skin every third day. 1 patch     senna-docusate 8.6-50 mg tabletCommonly known as: SENNA-PLUSTake 2 tablets by mouth daily. 2 tablet     spironolactone  50 mg tabletCommonly known as: ALDACTONEtake 1 tablet (50 mg total) by mouth every morning before breakfast      sucralfate  100 mg/mL suspensionCommonly known as: CARAFATETake 10 mL (1 g total) by mouth 4 (four) times a day before meals and nightly. On an empty  stomach. Therapeutic-M 9 mg iron-400 mcg per tabletGeneric drug: multivitamins-iron-folic acid-calcium-mineralsTake 1 tablet by mouth daily. 

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
## 2024-09-16 NOTE — Significant Event
 Received call from RN that patient had left AMA to go to the police station and then was planning on returning to the ED this afternoon. When patient was seen around 11:40AM patient reporting not wanting to go to [facility]. Stated she wanted to go home and then eventually to [place] asking multiple times regarding flying with G tube. Discussed at that time barriers to leaving including tube feeding. Has 4 cartons of tube feeds at home though does not tolerate bolus feeds and does not have a pump. Discussed waiting to talk with social work and CM Monday to which patient was agreeable. Patient later requested to leave AMA to go to police station. Patient signed AMA paperwork. RN contacted me after patient had left. Per RN patient stated she would return to ED after going to the police station. Bernardo Brayman PA-CHospital MedicineBest reached on MHB1:22 PM10/04/2024

## 2024-09-16 NOTE — Discharge Instructions
 After discharge, if you experience thoughts of suicide or thoughts of wanting to hurt others, please use one of the following resources for help:1.  Call your outpatient psychiatric provider(s), for example, psychiatrist, therapist and/or in-home service.2.  Call [phone] for the [facility].  Call the [employer] at [phone] MERRILYN).4.  Text the Crisis Text Line by texting the word HOME to [phone].  Call [phone].  Go to your local hospital Emergency Department. Please continue to care for your self as discussed in the emergency department.  Please follow up with your primary care doctor in 1 week.  Please return to the emergency department if you have any new or worsening symptoms such as developing fevers or chills, chest pain, shortness of breath, nausea vomiting or diarrhea, abdominal pain, or any other symptom that concerns you.  If you are unable to tolerate your tube feeds or have problems completing your tube feeds for any reason, please contact your primary care doctor, or return to the emergency department.Please return to the ED if you develop new or worsening or progressive symptoms including fever higher than 100.4, severe nausea/vomiting, if you are unable to keep yourself hydrated with fluids, have severe abdominal pain, lightheadedness or feel like you are going to pass out, chest pain, difficulty breathing, confusion, visual changes or weakness or numbness in your extremities or any other symptoms that concern you. In addition you should return to the ED if you are unable to adequate outpatient follow-up as directed.  If you were prescribed antibiotics it is very important to take them as prescribed for the entire course (until they are gone) if you start to feel better before they are finished. If you have any narcotics in your system do not operate heavy machinery, drive or care for children or elderly while these medicines are in your system.
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
## 2024-09-16 NOTE — Plan of Care
 Problem: Adult Inpatient Plan of CareGoal: Plan of Care ReviewOutcome: Interventions implemented as appropriate Plan of Care Overview/ Patient Status1900-0700Pt Aox4, cooperative with care. All safety measures maintained; including call bell in reach and bed in lowest position. OOBxIND. VSS on RA. No s/s of respiratory/cardiac distress. No c/o v/d; endorsing nausea, PRN zofran , scop patch intact to R ear. Pain/anxiety managed with PRN oxycodone /toradol /tylenol , vallum/atarax. PRN fioricet for migraine. PEG CDI; Jevity 1.5 @50cc /hr. Abd TTP, ND; endorsing flatus/BM. Voiding good amounts. Skin as documented. Pt confirms understanding of POC and medications. All needs met at this time. Electronically Signed by Massie Ply, RN, September 16, 2024

## 2024-09-16 NOTE — ED Notes
 2:44 PM 28yo female, comes in from police station making SI statements to EMS. Patient left AMA from the surgery floor. Past Medical History[1] [1] Past Medical History:Diagnosis Date  BMI 60.0-69.9, adult (HC Code)   Cancer (HC Code) 2012  Right ovarian granulosa cell carcinoma  Depression 09/20/2012  Headache   OSA (obstructive sleep apnea) 10/17/2014  CPAP  PCOS (polycystic ovarian syndrome) 09/20/2012

## 2024-09-16 NOTE — Plan of Care
 Plan of Care Overview/ Patient StatusProblem: Adult Inpatient Plan of CareGoal: Readiness for Transition of CareOutcome: Outcome(s) achievedCase Management Plan  Flowsheet Row Most Recent Value Discharge Planning  Patient/Patient Representative goals/treatment preferences for discharge are:  left AMA Discharge Readiness  PASRR completed and approved Yes Date PASRR Completed 09/14/24 Assessment ID# [id number] Authorization number obtained, if required N/A Is there a 3 day INPATIENT Qualifying stay for Medicare Patients? N/A Medicare IM- signed, dated, timed and scanned, if required N/A DME Authorized/Delivered N/A No needs identified/ follow up with PCP/MD/Outpatient Provider N/A Post acute care services secured W10 complete N/A PRI Completed and Accepted ([place]  Patients Only) N/A Is the destination address correct on the W10 N/A Is MVP Pathway complete? N/A Last Bowel Movement 09/15/24 Finalized Plan  Expected Discharge Date 09/16/24 Discharge Disposition Left Against Medical Advice CM was advised by provider that pt left hospital AMA today.  Dorothyann Hammersmith, BSN, RNCase Management DepartmentCare Coordinator

## 2024-09-16 NOTE — Plan of Care
 Problem: Adult Inpatient Plan of CareGoal: Plan of Care ReviewOutcome: Interventions implemented as appropriate Plan of Care Overview/ Patient StatusPatient very abruptly stating that she needs to leave the hospital to go to the police station.  Patient stating that she would come back.  Explained to her that she would have to come to the ED when she came back.  Patient walked out of the hospital at [facility] [address] entrance.  IV removed, patients feeding tube clamped.
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
## 2024-09-17 ENCOUNTER — Encounter: Admit: 2024-09-17 | Payer: PRIVATE HEALTH INSURANCE

## 2024-09-18 ENCOUNTER — Encounter: Admit: 2024-09-18 | Payer: PRIVATE HEALTH INSURANCE
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
## 2024-10-25 ENCOUNTER — Encounter: Admit: 2024-10-25 | Payer: PRIVATE HEALTH INSURANCE

## 2024-10-25 DIAGNOSIS — R29898 Other symptoms and signs involving the musculoskeletal system: Secondary | ICD-10-CM

## 2024-10-25 DIAGNOSIS — M546 Pain in thoracic spine: Principal | ICD-10-CM

## 2024-10-25 DIAGNOSIS — M545 Chronic midline low back pain without sciatica: Secondary | ICD-10-CM

## 2024-11-28 ENCOUNTER — Telehealth: Admit: 2024-11-28 | Payer: PRIVATE HEALTH INSURANCE | Attending: Gynecologic Oncology

## 2024-11-28 NOTE — Telephone Encounter [36]
 Patient called. Re: no cycle, irregulate menses, night sweats and day sweats.Please advise.

## 2025-01-07 ENCOUNTER — Ambulatory Visit: Admit: 2025-01-07 | Payer: PRIVATE HEALTH INSURANCE

## 2025-01-21 ENCOUNTER — Ambulatory Visit: Admit: 2025-01-21 | Payer: PRIVATE HEALTH INSURANCE
